# Patient Record
Sex: Female | Born: 1965 | Race: White | Hispanic: No | Marital: Married | Smoking: Former smoker
Health system: Southern US, Community
[De-identification: ages and names within clinical notes are randomized; demographics above are authoritative.]

## PROBLEM LIST (undated history)

## (undated) DIAGNOSIS — T8859XA Other complications of anesthesia, initial encounter: Secondary | ICD-10-CM

## (undated) DIAGNOSIS — M797 Fibromyalgia: Secondary | ICD-10-CM

## (undated) DIAGNOSIS — I10 Essential (primary) hypertension: Secondary | ICD-10-CM

## (undated) DIAGNOSIS — Z8669 Personal history of other diseases of the nervous system and sense organs: Secondary | ICD-10-CM

## (undated) DIAGNOSIS — T7840XA Allergy, unspecified, initial encounter: Secondary | ICD-10-CM

## (undated) DIAGNOSIS — D649 Anemia, unspecified: Secondary | ICD-10-CM

## (undated) DIAGNOSIS — A879 Viral meningitis, unspecified: Secondary | ICD-10-CM

## (undated) DIAGNOSIS — R519 Headache, unspecified: Secondary | ICD-10-CM

## (undated) DIAGNOSIS — M0230 Reiter's disease, unspecified site: Secondary | ICD-10-CM

## (undated) HISTORY — PX: APPENDECTOMY: SHX54

## (undated) HISTORY — DX: Essential (primary) hypertension: I10

## (undated) HISTORY — DX: Viral meningitis, unspecified: A87.9

## (undated) HISTORY — DX: Fibromyalgia: M79.7

## (undated) HISTORY — DX: Allergy, unspecified, initial encounter: T78.40XA

## (undated) HISTORY — DX: Anemia, unspecified: D64.9

## (undated) HISTORY — DX: Reiter's disease, unspecified site: M02.30

---

## 1998-01-27 ENCOUNTER — Emergency Department: Admit: 1998-01-27 | Payer: Self-pay

## 1998-11-25 ENCOUNTER — Inpatient Hospital Stay (HOSPITAL_COMMUNITY): Admission: AD | Admit: 1998-11-25 | Discharge: 1998-11-28 | Payer: Self-pay | Admitting: Obstetrics & Gynecology

## 1998-11-25 ENCOUNTER — Inpatient Hospital Stay (HOSPITAL_COMMUNITY): Admission: AD | Admit: 1998-11-25 | Discharge: 1998-11-25 | Payer: Self-pay | Admitting: Obstetrics and Gynecology

## 2001-04-08 ENCOUNTER — Other Ambulatory Visit: Admission: RE | Admit: 2001-04-08 | Discharge: 2001-04-08 | Payer: Self-pay | Admitting: Obstetrics and Gynecology

## 2001-04-09 ENCOUNTER — Encounter: Payer: Self-pay | Admitting: Obstetrics and Gynecology

## 2001-04-09 ENCOUNTER — Ambulatory Visit (HOSPITAL_COMMUNITY): Admission: RE | Admit: 2001-04-09 | Discharge: 2001-04-09 | Payer: Self-pay | Admitting: Obstetrics and Gynecology

## 2001-05-30 ENCOUNTER — Encounter: Payer: Self-pay | Admitting: Obstetrics and Gynecology

## 2001-05-30 ENCOUNTER — Ambulatory Visit (HOSPITAL_COMMUNITY): Admission: RE | Admit: 2001-05-30 | Discharge: 2001-05-30 | Payer: Self-pay | Admitting: Obstetrics and Gynecology

## 2001-11-09 ENCOUNTER — Inpatient Hospital Stay (HOSPITAL_COMMUNITY): Admission: AD | Admit: 2001-11-09 | Discharge: 2001-11-11 | Payer: Self-pay | Admitting: Obstetrics and Gynecology

## 2004-11-23 ENCOUNTER — Ambulatory Visit: Payer: Self-pay | Admitting: Family Medicine

## 2005-01-17 ENCOUNTER — Ambulatory Visit: Payer: Self-pay | Admitting: Internal Medicine

## 2005-05-15 ENCOUNTER — Emergency Department: Payer: Self-pay | Admitting: Emergency Medicine

## 2005-05-18 ENCOUNTER — Ambulatory Visit: Payer: Self-pay | Admitting: Family Medicine

## 2005-07-28 ENCOUNTER — Ambulatory Visit: Payer: Self-pay | Admitting: Family Medicine

## 2005-09-21 ENCOUNTER — Ambulatory Visit: Payer: Self-pay | Admitting: Family Medicine

## 2005-10-26 ENCOUNTER — Ambulatory Visit: Payer: Self-pay | Admitting: Family Medicine

## 2005-11-03 ENCOUNTER — Emergency Department: Payer: Self-pay | Admitting: Internal Medicine

## 2006-02-17 ENCOUNTER — Emergency Department: Payer: Self-pay | Admitting: Emergency Medicine

## 2006-10-03 ENCOUNTER — Ambulatory Visit: Payer: Self-pay | Admitting: Family Medicine

## 2007-03-19 ENCOUNTER — Ambulatory Visit: Payer: Self-pay | Admitting: Family Medicine

## 2007-03-19 DIAGNOSIS — IMO0002 Reserved for concepts with insufficient information to code with codable children: Secondary | ICD-10-CM | POA: Insufficient documentation

## 2007-03-21 ENCOUNTER — Emergency Department: Payer: Self-pay | Admitting: Emergency Medicine

## 2007-03-21 ENCOUNTER — Telehealth (INDEPENDENT_AMBULATORY_CARE_PROVIDER_SITE_OTHER): Payer: Self-pay | Admitting: *Deleted

## 2007-09-19 ENCOUNTER — Ambulatory Visit: Payer: Self-pay | Admitting: Family Medicine

## 2007-09-19 LAB — CONVERTED CEMR LAB: Rapid Strep: NEGATIVE

## 2007-11-18 ENCOUNTER — Telehealth (INDEPENDENT_AMBULATORY_CARE_PROVIDER_SITE_OTHER): Payer: Self-pay | Admitting: *Deleted

## 2007-11-19 ENCOUNTER — Ambulatory Visit: Payer: Self-pay | Admitting: Family Medicine

## 2007-11-19 DIAGNOSIS — S139XXA Sprain of joints and ligaments of unspecified parts of neck, initial encounter: Secondary | ICD-10-CM | POA: Insufficient documentation

## 2008-06-03 ENCOUNTER — Ambulatory Visit: Payer: Self-pay | Admitting: Obstetrics and Gynecology

## 2008-06-19 ENCOUNTER — Ambulatory Visit: Payer: Self-pay | Admitting: Family Medicine

## 2008-06-19 DIAGNOSIS — J069 Acute upper respiratory infection, unspecified: Secondary | ICD-10-CM | POA: Insufficient documentation

## 2009-03-15 ENCOUNTER — Ambulatory Visit: Payer: Self-pay | Admitting: Family Medicine

## 2009-03-15 LAB — CONVERTED CEMR LAB: Rapid Strep: NEGATIVE

## 2009-03-16 ENCOUNTER — Encounter: Payer: Self-pay | Admitting: Family Medicine

## 2009-03-25 ENCOUNTER — Encounter (INDEPENDENT_AMBULATORY_CARE_PROVIDER_SITE_OTHER): Payer: Self-pay | Admitting: *Deleted

## 2009-05-27 ENCOUNTER — Ambulatory Visit: Payer: Self-pay | Admitting: Family Medicine

## 2009-05-27 DIAGNOSIS — E663 Overweight: Secondary | ICD-10-CM | POA: Insufficient documentation

## 2009-05-27 DIAGNOSIS — R635 Abnormal weight gain: Secondary | ICD-10-CM | POA: Insufficient documentation

## 2009-05-29 LAB — CONVERTED CEMR LAB: TSH: 1.79 microintl units/mL (ref 0.35–5.50)

## 2009-05-31 ENCOUNTER — Encounter (INDEPENDENT_AMBULATORY_CARE_PROVIDER_SITE_OTHER): Payer: Self-pay | Admitting: *Deleted

## 2009-09-20 ENCOUNTER — Telehealth: Payer: Self-pay | Admitting: Internal Medicine

## 2009-09-20 ENCOUNTER — Telehealth: Payer: Self-pay | Admitting: Family Medicine

## 2009-09-21 ENCOUNTER — Emergency Department (HOSPITAL_COMMUNITY): Admission: EM | Admit: 2009-09-21 | Discharge: 2009-09-21 | Payer: Self-pay | Admitting: Family Medicine

## 2009-10-26 ENCOUNTER — Telehealth: Payer: Self-pay | Admitting: Family Medicine

## 2009-10-27 ENCOUNTER — Ambulatory Visit: Payer: Self-pay | Admitting: Family Medicine

## 2009-10-27 DIAGNOSIS — F639 Impulse disorder, unspecified: Secondary | ICD-10-CM | POA: Insufficient documentation

## 2009-10-27 DIAGNOSIS — S0990XA Unspecified injury of head, initial encounter: Secondary | ICD-10-CM | POA: Insufficient documentation

## 2009-10-27 DIAGNOSIS — R519 Headache, unspecified: Secondary | ICD-10-CM | POA: Insufficient documentation

## 2009-10-27 DIAGNOSIS — R51 Headache: Secondary | ICD-10-CM | POA: Insufficient documentation

## 2009-10-29 LAB — CONVERTED CEMR LAB
BUN: 19 mg/dL (ref 6–23)
CO2: 22 meq/L (ref 19–32)
Calcium: 9.7 mg/dL (ref 8.4–10.5)
Chloride: 106 meq/L (ref 96–112)
Creatinine, Ser: 0.9 mg/dL (ref 0.4–1.2)
GFR calc non Af Amer: 72.94 mL/min (ref >60)
Glucose, Bld: 87 mg/dL (ref 70–99)
Potassium: 4.9 meq/L (ref 3.5–5.1)
Sodium: 144 meq/L (ref 135–145)
TSH: 1.41 microintl units/mL (ref 0.35–5.50)

## 2009-11-02 ENCOUNTER — Encounter: Admission: RE | Admit: 2009-11-02 | Discharge: 2009-11-02 | Payer: Self-pay | Admitting: Otolaryngology

## 2010-05-07 ENCOUNTER — Ambulatory Visit: Payer: Self-pay | Admitting: Family Medicine

## 2010-05-07 DIAGNOSIS — M549 Dorsalgia, unspecified: Secondary | ICD-10-CM | POA: Insufficient documentation

## 2010-05-07 LAB — CONVERTED CEMR LAB
Bilirubin Urine: NEGATIVE
Blood in Urine, dipstick: NEGATIVE
Glucose, Urine, Semiquant: NEGATIVE
Ketones, urine, test strip: NEGATIVE
Nitrite: NEGATIVE
Protein, U semiquant: NEGATIVE
Specific Gravity, Urine: >=1.03
Urobilinogen, UA: 0.2
WBC Urine, dipstick: NEGATIVE
pH: 6

## 2010-05-09 ENCOUNTER — Ambulatory Visit: Payer: Self-pay | Admitting: Family Medicine

## 2010-12-11 ENCOUNTER — Encounter: Payer: Self-pay | Admitting: Family Medicine

## 2010-12-20 NOTE — Assessment & Plan Note (Signed)
Summary: FOLLOW UP WITH BACK PAIN   Vital Signs:  Patient profile:   46 year old female Height:      68.25 inches Weight:      229.0 pounds BMI:     34.69 Temp:     99.5 degrees F oral Pulse rate:   72 / minute Pulse rhythm:   regular BP sitting:   106 / 70  (left arm) Cuff size:   large  Vitals Entered By: Benny Lennert CMA Duncan Dull) (May 09, 2010 2:12 PM)  History of Present Illness: Chief complaint follow up saturday clinic (Back Pain)  45 year old female:  Friday could not even get ou tof bed Did this once before about sin months ago or so and went away on its own.   Improving, taking alleve and flexeril  REVIEW OF SYSTEMS  GEN: No systemic complaints, no fevers, chills, sweats, or other acute illnesses MSK: Detailed in the HPI GI: tolerating PO intake without difficulty Neuro: No numbness, parasthesias, or tingling associated. Otherwise the pertinent positives of the ROS are noted above.    GEN: Well-developed,well-nourished,in no acute distress; alert,appropriate and cooperative throughout examination HEENT: Normocephalic and atraumatic without obvious abnormalities. No apparent alopecia or balding. Ears, externally no deformities PULM: Breathing comfortably in no respiratory distress EXT: No clubbing, cyanosis, or edema PSYCH: Normally interactive. Cooperative during the interview. Pleasant. Friendly and conversant. Not anxious or depressed appearing. Normal, full affect.   FUll amd excellent ROM at hips L leg with back pain with SLR but no radiculopathy DTR2+ sensation intact  Allergies: 1)  ! * Generic Wellbutrin   Impression & Recommendations:  Problem # 1:  BACK PAIN (ICD-724.5) Assessment Improved getting better not true SLR +  with acute onset, suspect mild discogenic  Her updated medication list for this problem includes:    Vicodin Es 7.5-750 Mg Tabs (Hydrocodone-acetaminophen) .Marland Kitchen... Take 1 tablet by mouth three times a day    Flexeril 10 Mg  Tabs (Cyclobenzaprine hcl) .Marland Kitchen... Take 1 tablet by mouth three times a day  Complete Medication List: 1)  Vicodin Es 7.5-750 Mg Tabs (Hydrocodone-acetaminophen) .... Take 1 tablet by mouth three times a day 2)  Flexeril 10 Mg Tabs (Cyclobenzaprine hcl) .... Take 1 tablet by mouth three times a day  Current Allergies (reviewed today): ! Luvenia Heller [facility]

## 2010-12-20 NOTE — Assessment & Plan Note (Signed)
Summary: BACK PAIN/CLE   Vital Signs:  Patient profile:   45 year old female Weight:      229 pounds Temp:     98.1 degrees F oral Pulse rate:   70 / minute BP sitting:   120 / 80  (left arm)  Vitals Entered By: Doristine Devoid (May 07, 2010 9:26 AM) CC: back pain x3 days    CC:  back pain x3 days .  History of Present Illness: 45 y/o fem w her 2nd episode of severe LBP.  pain lumbar r &l costant x 3 days..sharp.Marland Kitchen a 10 . w/ rad.  No bowel or bladder problems  Allergies: 1)  ! * Generic Wellbutrin  Past History:  Past medical, surgical, family and social histories (including risk factors) reviewed for relevance to current acute and chronic problems.  Past Medical History: Reviewed history from 10/27/2009 and no changes required. concussion 2008  Family History: Reviewed history and no changes required.  Social History: Reviewed history from 10/27/2009 and no changes required. works at [employer]  light smoker married with children rare alcohol  Physical Exam  General:  Well-developed,well-nourished,in no acute distress; alert,appropriate and cooperative throughout examination Msk:  No deformity or scoliosis noted of thoracic or lumbar spine.   Pulses:  R and L carotid,radial,femoral,dorsalis pedis and posterior tibial pulses are full and equal bilaterally Extremities:  No clubbing, cyanosis, edema, or deformity noted with normal full range of motion of all joints.   Neurologic:  No cranial nerve deficits noted. Station and gait are normal. Plantar reflexes are down-going bilaterally. DTRs are symmetrical throughout. Sensory, motor and coordinative functions appear intact....pos. SLR L leg @ 30 deg.   Impression & Recommendations:  Problem # 1:  BACK PAIN (ICD-724.5) Assessment Deteriorated  Orders: UA Dipstick w/o Micro (manual) (16109)  Her updated medication list for this problem includes:    Vicodin Es 7.5-750 Mg Tabs  (Hydrocodone-acetaminophen) .Marland Kitchen... Take 1 tablet by mouth three times a day    Flexeril 10 Mg Tabs (Cyclobenzaprine hcl) .Marland Kitchen... Take 1 tablet by mouth three times a day  Complete Medication List: 1)  Juice Plus Fibre Liqd (Nutritional supplements) .... Weekly 2)  Zoloft 50 Mg Tabs (Sertraline hcl) .Marland Kitchen.. 1 by mouth once daily in evening 3)  Vicodin Es 7.5-750 Mg Tabs (Hydrocodone-acetaminophen) .... Take 1 tablet by mouth three times a day 4)  Flexeril 10 Mg Tabs (Cyclobenzaprine hcl) .... Take 1 tablet by mouth three times a day  Patient Instructions: 1)  bed rest today and sun 2)  flex. and vicodi 1/2 to 1 of each 3 x day for pain. 3)  see ypur PCP mon for followup Prescriptions: FLEXERIL 10 MG TABS (CYCLOBENZAPRINE HCL) Take 1 tablet by mouth three times a day  #30 x 1   Entered and Authorized by:   Roderick Pee MD   Signed by:   Roderick Pee MD on 05/07/2010   Method used:   Print then Give to Patient   RxID:   [id number] VICODIN ES 7.5-750 MG TABS (HYDROCODONE-ACETAMINOPHEN) Take 1 tablet by mouth three times a day  #30 x 1   Entered and Authorized by:   Roderick Pee MD   Signed by:   Roderick Pee MD on 05/07/2010   Method used:   Print then Give to Patient   RxID:   [id number]   Laboratory Results   Urine Tests    Routine Urinalysis   Glucose: negative   (  Normal Range: Negative) Bilirubin: negative   (Normal Range: Negative) Ketone: negative   (Normal Range: Negative) Spec. Gravity: >=1.030   (Normal Range: 1.003-1.035) Blood: negative   (Normal Range: Negative) pH: 6.0   (Normal Range: 5.0-8.0) Protein: negative   (Normal Range: Negative) Urobilinogen: 0.2   (Normal Range: 0-1) Nitrite: negative   (Normal Range: Negative) Leukocyte Esterace: negative   (Normal Range: Negative)

## 2011-02-15 ENCOUNTER — Encounter: Payer: Self-pay | Admitting: Family Medicine

## 2011-02-16 ENCOUNTER — Ambulatory Visit (INDEPENDENT_AMBULATORY_CARE_PROVIDER_SITE_OTHER): Payer: BC Managed Care – PPO | Admitting: Family Medicine

## 2011-02-16 ENCOUNTER — Encounter: Payer: Self-pay | Admitting: Family Medicine

## 2011-02-16 VITALS — BP 122/72 | HR 84 | Temp 97.8°F | Ht 68.25 in | Wt 241.1 lb

## 2011-02-16 DIAGNOSIS — R0789 Other chest pain: Secondary | ICD-10-CM | POA: Insufficient documentation

## 2011-02-16 NOTE — Progress Notes (Signed)
Sunday night into Monday she was having chest pressure.  She went to UC and EKG/CXR were done, normal per patient.  H pylori breath test was positive per patient.  She has been working out with Education administrator. She is slightly, episodically SOB.  She had a recent HA- h/o migraines- better with excedrin.    She doesn't get SOB/CP with exercise.  The chest pressure was constant, could happen at rest. Still able to exercise since her sx started.  No FCNAVD.  Smoking 3-5 cigs/day. She is working on quitting.  No wheeze. Prev with neg stress test per patient, but I don't have records to review.    She feels some better today.  She started the nexium yesterday.    Meds, vitals, and allergies reviewed.   ROS: See HPI.  Otherwise, noncontributory.  GEN: nad, alert and oriented HEENT: mucous membranes moist NECK: supple w/o LA CV: rrr.  no murmur PULM: ctab, no inc wob ABD: soft, +bs, very minimally ttp in epigastrum/superior region of RUQ/LUQ, not ttp o/w.  EXT: no edema SKIN: no acute rash

## 2011-02-16 NOTE — Assessment & Plan Note (Addendum)
Likely GERD.  Requesting records.  She had normal EKG and CXR per report.  This is unlikely to be cardiac.  Improved on PPI.  I would hold on treating for H pylori now- if she has return of sx after a few weeks of PPI, then we can treat for H pylori.  She'll call back after 2 week trial with PPI if sx return.  She agrees with plan.  No red flag sx- blood in stool, etc.  Okay for outpatient fu.

## 2011-02-16 NOTE — Patient Instructions (Addendum)
Keep taking the nexium for the next 2-3 weeks and work on stopping smoking.  If your symptoms come back after stopping the nexium, let us know.  We'll request your records in the meantime.  Take care.  GI caution given to patient- avoid etoh, NSAIDS, caffeine.  She understood.

## 2011-02-16 NOTE — Progress Notes (Signed)
Addended by: Raechel Ache on: 02/16/2011 09:18 AM   Modules accepted: Orders

## 2011-03-13 ENCOUNTER — Encounter: Payer: Self-pay | Admitting: Family Medicine

## 2011-03-17 ENCOUNTER — Encounter: Payer: Self-pay | Admitting: Family Medicine

## 2011-04-07 NOTE — H&P (Signed)
[facility]  Patient:    Julie, Mcknight Visit Number: [id number] MRN: [id number]          Service Type: OBS Location: [id number] Attending Physician:  Shaune Spittle Dictated by:   Marcelle Smiling Clelia Croft, C.N.M. Admit Date:  11/09/2001                           History and Physical  DATE OF BIRTH:                08-May-1966  HISTORY OF PRESENT ILLNESS:   [name] is a 45 year old married white female gravida 4 para 2-0-1-2 at 68 and four-sevenths weeks who presents for induction of labor secondary to post dates.  Her estimated date of confinement was established by certain last menstrual period and confirmed by first trimester ultrasound.  Her pregnancy has been followed by the [facility] OB/GYN certified nurse midwife service and has been remarkable for: 1. History of migraines. 2. 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 3. Increased prepregancy weight. 4. First trimester bleeding. 5. Group B strep negative.  PRENATAL LABORATORY DATA:     Collected on Apr 08, 2001:  Hemoglobin 12.6; hematocrit 38.7; platelets 242,000.  Blood type O positive, antibody negative. RPR nonreactive.  Rubella immune.  Hepatitis B surface antigen negative.  Pap smear within normal limits.  Gonorrhea and chlamydia negative from Mar 24, 2001.  On July 29, 2001 her one-hour Glucola was 93.  Hemoglobin at that time was 11.6.  Culture of the vaginal tract for group B strep on September 30, 2001 was negative.  HISTORY OF PRESENT PREGNANCY:                    She presented for care at approximately [redacted] weeks gestation.  Pregnancy ultrasonography was done soon after her first visit because of bright red vaginal bleeding as well as no fetal heart tones audible, and dating was consistent with last menstrual period dating as well as subchorionic hemorrhage was noted.  Pregnancy ultrasonography at [redacted] weeks gestation showed consistent  growth and was normal otherwise.  The rest of the pregnancy was unremarkable.  OBSTETRICAL HISTORY:          She is a gravida 4 para 2-0-1-2.  In October 1988 at [redacted] weeks gestation, she had an elective AB with no complications.  In January 1998 she vaginally delivered a female infant at [redacted] weeks gestation after 30 hours in labor.  He weighed 7 pounds 0 ounces and she received Stadol for analgesia.  In November 2000 she vaginally delivered a female infant at [redacted] weeks gestation after nine hours in labor.  His name was Aidan.  He weighed 7 pounds and 8 ounces and she had an epidural for anesthesia.  She had no complications with her pregnancies.  ALLERGIES:                    No medication allergies.  MEDICAL HISTORY:              She reports migraines from oral contraceptive use.  She reports difficulty conceiving her second pregnancy.  She has a history of HSV.  She has occasional yeast infection.  She has had the usual childhood illnesses.  She was diagnosed with gallbladder disease in 2000 which has been controlled with nutrition.  She has an occasional urinary tract infection.  She also reports  occasional ocular migraines that involve no painful headaches, only fuzzy vision.  FAMILY HISTORY:               Remarkable for father and paternal aunt with coronary artery disease.  Father with history of hypertension.  Mother with history of basal cell carcinoma.  Fathers family has a history of alcohol abuse.  GENETIC HISTORY:              Unremarkable.  SOCIAL HISTORY:               She is married to the father of the baby.  His name is [name].  They are of the [employer] religion.  They are both college educated and employed, and they deny any alcohol, tobacco, or illicit drug use with the pregnancy.  OBJECTIVE DATA:  VITAL SIGNS:                  Stable.  She is afebrile.  HEENT:                        Grossly within normal limits.  CHEST:                        Clear to  auscultation.  HEART:                        Regular to rate and rhythm.  ABDOMEN:                      Soft and nontender and gravid in contour with the uterine fundus extending approximately 40 cm above the pubic symphysis. Electronic fetal monitoring is remarkable for reactive and reassuring fetal heart rate with rare uterine contractions that are mild.  PELVIC:                       Shows no HSV lesions.  Cervix was initially 3+ cm, 80%, vertex at the 0 station.  After artificial rupture of membranes for clear fluid, the cervix was then 4 cm.  EXTREMITIES:                  Within normal limits.  ASSESSMENT:                   1. Intrauterine pregnancy at 41-and-a-half                                  weeks.                               2. Favorable cervix.  PLAN:                         1. Admit to birthing suite per consult with                                  Dr. Pennie Rushing.                               2. Routine CNM orders.  3. Proceed with induction of labor after                                  discussion with patient and father of the                                  baby of the risks and benefits involved.                               4. Plan artificial rupture of membranes as the                                  induction method. Dictated by:   Marcelle Smiling Clelia Croft, C.N.M. Attending Physician:  Shaune Spittle DD:  11/09/01 TD:  11/09/01 Job: [id number]

## 2011-05-31 ENCOUNTER — Ambulatory Visit (INDEPENDENT_AMBULATORY_CARE_PROVIDER_SITE_OTHER): Payer: BC Managed Care – PPO | Admitting: Family Medicine

## 2011-05-31 ENCOUNTER — Encounter: Payer: Self-pay | Admitting: Family Medicine

## 2011-05-31 DIAGNOSIS — M722 Plantar fascial fibromatosis: Secondary | ICD-10-CM | POA: Insufficient documentation

## 2011-05-31 NOTE — Progress Notes (Signed)
[name], a 45 y.o. female presents today in the office for the following:   Six months will have some pain at the end of the day and pain at the heel, and pain in the morning.  Icing non-stop over the weekend.   The patient presents with a 6 month long history of L heel pain. This is notable for worsening pain first thing in the morning when arising and standing after sitting.   Prior foot or ankle fractures: none Prior operations: none Orthotics or bracing: otc insole Medications: none PT or home rehab: none Night splints: no Ice massage: no Ball massage: no  Metatarsal pain: no  The PMH, PSH, Social History, Family History, Medications, and allergies have been reviewed in [facility], and have been updated if relevant.  REVIEW OF SYSTEMS  GEN: No fevers, chills. Nontoxic. Primarily MSK c/o today. MSK: Detailed in the HPI GI: tolerating PO intake without difficulty Neuro: No numbness, parasthesias, or tingling associated. Otherwise the pertinent positives of the ROS are noted above.   PHYSICAL EXAM  Blood pressure 128/76, pulse 76, temperature 98.7 F (37.1 C), temperature source Oral, weight 239 lb (108.41 kg).  GEN: Well-developed,well-nourished,in no acute distress; alert,appropriate and cooperative throughout examination HEENT: Normocephalic and atraumatic without obvious abnormalities. Ears, externally no deformities PULM: Breathing comfortably in no respiratory distress EXT: No clubbing, cyanosis, or edema PSYCH: Normally interactive. Cooperative during the interview. Pleasant. Friendly and conversant. Not anxious or depressed appearing. Normal, full affect.  Echymosis: no Edema: no ROM: full LE B Gait: heel toe, non-antalgic MT pain: no Callus pattern: none Lateral Mall: NT Medial Mall: NT Talus: NT Navicular: NT Calcaneous: NT Metatarsals: NT 5th MT: NT Phalanges: NT Achilles: NT Plantar Fascia: tender, medial along PF. Pain with forced dorsi. TTP on  bottom of heel Fat Pad: NT Peroneals: NT Post Tib: NT Great Toe: Nml motion Ant Drawer: neg Other foot breakdown: none Long arch: preserved Transverse arch: preserved Hindfoot breakdown: none Sensation: intact  A/P: Plantar fascitis: Classic history  >25 minutes spent in face to face time with patient, >50% spent in counselling or coordination of care  We reviewed that stretching is critically important to the treatment of PF. Reviewed footwear. Rigid soles have been shown to help with PF. Reviewed rehab of stretching and calf raises.   The patient would be an ideal candidate for custom orthotics, which were shown in a 2008 Cochrane Review to be beneficial in PF. Could benefit from a corticosteroid injection if conservative treatment fails.  Arch binder

## 2011-05-31 NOTE — Patient Instructions (Addendum)

## 2011-10-11 ENCOUNTER — Encounter: Payer: Self-pay | Admitting: Family Medicine

## 2011-10-11 ENCOUNTER — Ambulatory Visit (INDEPENDENT_AMBULATORY_CARE_PROVIDER_SITE_OTHER): Payer: BC Managed Care – PPO | Admitting: Family Medicine

## 2011-10-11 DIAGNOSIS — J209 Acute bronchitis, unspecified: Secondary | ICD-10-CM

## 2011-10-11 MED ORDER — AZITHROMYCIN 250 MG PO TABS: ORAL_TABLET | ORAL | Status: AC

## 2011-10-11 NOTE — Progress Notes (Signed)
  Subjective:    Patient ID: [name], female    DOB: 04-08-1966, 45 y.o.   MRN: [id number]  HPI Pt of Dr Royden Purl here as acute appt for URI symptoms. She has had a cold for a week and a hlf and now has moved into the chest and is coughing up green discharge. She denies fever or chills, some headache in the frontal area, no ear pain, some rhinitis that is clear, no ST, cough that is coming up green, no N/V, no diarrhea, some SOB on treadmilll this AM, no achiness She has taken Mucinex cough this AM.     Review of SystemsNoncontributory except as above.       Objective:   Physical Exam  Constitutional: She appears well-developed and well-nourished. No distress.  HENT:  Head: Normocephalic and atraumatic.  Right Ear: External ear normal.  Left Ear: External ear normal.  Nose: Nose normal.  Mouth/Throat: Oropharynx is clear and moist. No oropharyngeal exudate.       Mild tenderness to palpation in sinus distribution.  Eyes: Conjunctivae and EOM are normal. Pupils are equal, round, and reactive to light.  Neck: Normal range of motion. Neck supple. No thyromegaly present.  Cardiovascular: Normal rate, regular rhythm and normal heart sounds.   Pulmonary/Chest: Effort normal and breath sounds normal. She has no wheezes. She has no rales.  Lymphadenopathy:    She has no cervical adenopathy.  Skin: She is not diaphoretic.          Assessment & Plan:

## 2011-10-11 NOTE — Patient Instructions (Signed)
Take Guaifenesin (400mg ), take 11/2 tabs by mouth AM and NOON. Get GUAIFENESIN by  going to [facility] or [facility] and getting MUCOUS RELIEF EXPECTORANT/CONGESTION. DO NOT GET MUCINEX (Timed Release Guaifenesin)  Drink ;lots of fluids. Keep lozenge in mouth. Vicks at night. If no improvement, use Zpak.

## 2011-10-11 NOTE — Assessment & Plan Note (Signed)
See instructions. Discussed pathophysiology of smoking and bronchitis.

## 2011-10-25 ENCOUNTER — Ambulatory Visit: Payer: Self-pay | Admitting: Obstetrics and Gynecology

## 2011-11-28 ENCOUNTER — Encounter: Payer: Self-pay | Admitting: Family Medicine

## 2011-11-28 ENCOUNTER — Ambulatory Visit (INDEPENDENT_AMBULATORY_CARE_PROVIDER_SITE_OTHER): Payer: BC Managed Care – PPO | Admitting: Family Medicine

## 2011-11-28 VITALS — BP 116/74 | HR 76 | Temp 98.1°F | Wt 249.2 lb

## 2011-11-28 DIAGNOSIS — M545 Low back pain, unspecified: Secondary | ICD-10-CM

## 2011-11-28 MED ORDER — NAPROXEN 500 MG PO TABS: ORAL_TABLET | ORAL | Status: DC

## 2011-11-28 MED ORDER — CYCLOBENZAPRINE HCL 10 MG PO TABS: 10.0000 mg | ORAL_TABLET | Freq: Two times a day (BID) | ORAL | Status: AC | PRN

## 2011-11-28 NOTE — Patient Instructions (Signed)
I do think you have lumbar sprain, could be some irritation of lumbar discs. Treat conservatively with naprosyn and flexeril (muscle relaxant). If not improving over next few weeks let us know. If worsening please return. Continue stretching exercises using pain as your guide.

## 2011-11-28 NOTE — Progress Notes (Signed)
  Subjective:    Patient ID: [name], female    DOB: 11/29/1965, 46 y.o.   MRN: [id number]  HPI CC: back pain  5d h/o back feeling "like going to give out".  Tried NSAID, heating pad, etc.  Started new job Monday.  Had busy day, that night bad back pain with trouble sleeping from this.  Has been told may have DDD lumbar/sacral area.  Pain bilateral lower back, feels along "sacral crest".  Described as sharp stabbing, worse with position changes, rolling over in bed.  Pain associated with nausea when severe.  Also has been doing yoga stretches.    Last back pain was several years back.  Recently bending, twisting, cleaning out closet, thinks this aggravated back.  No radiculopathy, paresthesias, fevers/chills, bowel/bladder accidents.  Review of Systems Per HPI    Objective:   Physical Exam  Nursing note and vitals reviewed. Constitutional: She appears well-developed and well-nourished. No distress.       Stiff movements  Musculoskeletal:       Mild midline lumbar spine pain No paraspinous mm tenderness. Neg SLR bilaterally No pain with int/ext rotation at hips. Neg FABER test bilaterally. + mild tenderness SIJ bilaterally. No pain at GTB or sciatic notch  Neurological: She has normal strength. No sensory deficit. Coordination normal.       Diminished DTRs bilaterally  Skin: Skin is warm and dry. No rash noted.  Psychiatric: She has a normal mood and affect.       Assessment & Plan:

## 2011-11-28 NOTE — Assessment & Plan Note (Signed)
No radiculopathy or red flags. Anticipate lumbar sprain, treat conservatively with NSAIDs, flexeril. Update [place] if not improved as expected or prolonged pain. Declines stronger pain med.

## 2012-03-04 ENCOUNTER — Telehealth: Payer: Self-pay | Admitting: Family Medicine

## 2012-03-04 NOTE — Telephone Encounter (Signed)
Triage Record Num: [id number] Operator: Baldomero Lamy [name] Call Date & Time: 03/02/2012 8:01:26AM Patient Phone: [phone] PCP: Audrie Gallus. Tower Patient Gender: Female PCP Fax : Patient DOB: 05/16/1966 Practice Name:  [facility] Reason for Call: Caller: [place]/Patient; PCP: Roxy Manns A.; CB#: [phone]; Call regarding Concerns for breathing sounding differently when laying down. Calling regarding Sinus/Allergy sxs; drainage, sore throat, puffy eyes. Afebrile. Onset "2 days ago". Trx with Dayquill Sinus and warm liquids with some relief. Triaged per Sore Throat or Hoarseness. Disp: See in 2 weeks. Home care advice given with call back parameters. Protocol(s) Used: Sore Throat or Hoarseness Recommended Outcome per Protocol: See Provider within 2 Weeks Reason for Outcome: Recent or recurrent episodes of sneezing, nasal congestion; watery nasal drainage; scratchy/itchy throat; red/itchy/watery eyes or cough unrelieved after one week of home care measures Care Advice: For perennial symptoms, consider damp mopping or vacuuming house 2-3 times weekly, limit contact with pets, remove feather pillows, use nonallergenic cosmetics, and eliminate areas where mold spores can collect (e.g., piles of old newspapers and magazines, or old furniture). ~ Benadryl and Chlorpheniramine are the antihistamines used most commonly during pregnancy. These may be used according to package directions if ok'd with provider. ~ Throat symptoms that do not resolve must be evaluated by provider to rule out serious causes such as cancer. Tobacco use in any form increases risk of cancer. ~ For seasonal symptoms, avoid wooded or grassy areas during pollination season and remain indoors when symptoms are severe or pollen counts are high. ~ ~ Consider use of a saline nasal spray per package directions to help relieve nasal congestion. Avoid exposure to environmental irritants. Do  not smoke and avoid second-hand smoke. Avoid outside activities on high pollution days. Instead of strong smelling commercial cleaning products, substitute vinegar and lemon juice. ~ ~ HEALTH PROMOTION / MAINTENANCE ~ Call provider if symptoms become severe, or if treatment that relieved the symptoms in the past is no longer working. ~ SYMPTOM / CONDITION MANAGEMENT ~ CAUTIONS If not contraindicated, consider nonprescription non-sedating antihistamine for symptom relief as directed on label or by pharmacist. Other antihistamine medications may cause drowsiness or confusion, especially in elderly or chronically ill patients and should be taken with caution. ~ Do not take nonprescription decongestants (such as Actifed, Sinutab, Sudafed) if there is a history of hypertension, unless specifically directed by provider. ~ 03/02/2012 8:19:15AM Page 1 of 1 [id number]

## 2012-10-10 ENCOUNTER — Ambulatory Visit (INDEPENDENT_AMBULATORY_CARE_PROVIDER_SITE_OTHER): Payer: BC Managed Care – PPO | Admitting: Internal Medicine

## 2012-10-10 ENCOUNTER — Encounter: Payer: Self-pay | Admitting: Internal Medicine

## 2012-10-10 VITALS — BP 120/70 | HR 97 | Temp 97.7°F | Wt 241.0 lb

## 2012-10-10 DIAGNOSIS — J029 Acute pharyngitis, unspecified: Secondary | ICD-10-CM | POA: Insufficient documentation

## 2012-10-10 LAB — POCT RAPID STREP A (OFFICE): Rapid Strep A Screen: NEGATIVE

## 2012-10-10 NOTE — Progress Notes (Signed)
  Subjective:    Patient ID: [name], female    DOB: 17-Apr-1966, 46 y.o.   MRN: [id number]  HPI Sore throat for a week Now really bad---worsened Some headache in AM Constant pain today in throat --- worse with swallowed No obvious swollen glands  No fever No sig cough No SOB No ear pain No sig head congestion or rhinorrhea Slight wheezing  29 year old son has sore throat also Hasn't tried any meds for this  No current outpatient prescriptions on file prior to visit.    No Known Allergies  Past Medical History  Diagnosis Date  . Concussion 2008    No past surgical history on file.  No family history on file.  History   Social History  . Marital Status: Married    Spouse Name: N/A    Number of Children: N/A  . Years of Education: N/A   Occupational History  . [employer] in [employer]    Social History Main Topics  . Smoking status: Current Every Day Smoker  . Smokeless tobacco: Never Used  . Alcohol Use: Yes     Comment: Rare  . Drug Use: No  . Sexually Active: Not on file   Other Topics Concern  . Not on file   Social History Narrative  . No narrative on file   Review of Systems Got some nausea briefly Generally still eating No rash    Objective:   Physical Exam  Constitutional: She appears well-developed and well-nourished. No distress.  HENT:       No sinus tenderness Mild nasal inflammation Mild pharyngeal injection without exudates  Neck: Normal range of motion.       Slight tenderness along anterior cervical chain bilat without clear cut adenopathy  Pulmonary/Chest: Effort normal and breath sounds normal. No respiratory distress. She has no wheezes. She has no rales.          Assessment & Plan:

## 2012-10-10 NOTE — Assessment & Plan Note (Signed)
Has a sick 45 year old at home so I did rapid test---negative Seems to still just be viral infection Discussed supportive Rx

## 2013-02-15 ENCOUNTER — Encounter: Payer: Self-pay | Admitting: Internal Medicine

## 2013-02-15 ENCOUNTER — Emergency Department (HOSPITAL_COMMUNITY): Payer: BC Managed Care – PPO

## 2013-02-15 ENCOUNTER — Encounter (HOSPITAL_COMMUNITY): Payer: Self-pay | Admitting: Emergency Medicine

## 2013-02-15 ENCOUNTER — Ambulatory Visit (INDEPENDENT_AMBULATORY_CARE_PROVIDER_SITE_OTHER): Payer: BC Managed Care – PPO | Admitting: Internal Medicine

## 2013-02-15 ENCOUNTER — Inpatient Hospital Stay (HOSPITAL_COMMUNITY)
Admission: EM | Admit: 2013-02-15 | Discharge: 2013-02-17 | DRG: 021 | Disposition: A | Discharge status: Home / Self Care | Payer: BC Managed Care – PPO | Attending: Internal Medicine | Admitting: Internal Medicine

## 2013-02-15 VITALS — BP 122/80 | HR 74 | Temp 99.1°F | Ht 69.0 in | Wt 243.5 lb

## 2013-02-15 DIAGNOSIS — T368X5A Adverse effect of other systemic antibiotics, initial encounter: Secondary | ICD-10-CM | POA: Diagnosis not present

## 2013-02-15 DIAGNOSIS — Z79899 Other long term (current) drug therapy: Secondary | ICD-10-CM

## 2013-02-15 DIAGNOSIS — G039 Meningitis, unspecified: Secondary | ICD-10-CM

## 2013-02-15 DIAGNOSIS — F17200 Nicotine dependence, unspecified, uncomplicated: Secondary | ICD-10-CM | POA: Diagnosis present

## 2013-02-15 DIAGNOSIS — E663 Overweight: Secondary | ICD-10-CM

## 2013-02-15 DIAGNOSIS — M545 Low back pain, unspecified: Secondary | ICD-10-CM

## 2013-02-15 DIAGNOSIS — R51 Headache: Secondary | ICD-10-CM

## 2013-02-15 DIAGNOSIS — A879 Viral meningitis, unspecified: Principal | ICD-10-CM | POA: Diagnosis present

## 2013-02-15 DIAGNOSIS — R519 Headache, unspecified: Secondary | ICD-10-CM | POA: Diagnosis present

## 2013-02-15 DIAGNOSIS — L270 Generalized skin eruption due to drugs and medicaments taken internally: Secondary | ICD-10-CM | POA: Diagnosis not present

## 2013-02-15 LAB — CBC WITH DIFFERENTIAL/PLATELET
Basophils Absolute: 0.1 10*3/uL (ref 0.0–0.1)
Basophils Absolute: 0 10*3/uL (ref 0.0–0.1)
Basophils Relative: 1 % (ref 0–1)
Basophils Relative: 0 % (ref 0–1)
Eosinophils Absolute: 0.2 10*3/uL (ref 0.0–0.7)
Eosinophils Absolute: 0.2 10*3/uL (ref 0.0–0.7)
Eosinophils Relative: 2 % (ref 0–5)
HCT: 42.8 % (ref 36.0–46.0)
MCH: 27.9 pg (ref 26.0–34.0)
MCHC: 33.2 g/dL (ref 30.0–36.0)
MCHC: 32.9 g/dL (ref 30.0–36.0)
MCV: 84.8 fL (ref 78.0–100.0)
Monocytes Absolute: 0.6 10*3/uL (ref 0.1–1.0)
Monocytes Absolute: 0.8 10*3/uL (ref 0.1–1.0)
Neutro Abs: 6.5 10*3/uL (ref 1.7–7.7)
Neutrophils Relative %: 61 % (ref 43–77)
Platelets: 270 10*3/uL (ref 150–400)
RDW: 12.9 % (ref 11.5–15.5)
RDW: 12.9 % (ref 11.5–15.5)

## 2013-02-15 LAB — PROTIME-INR
INR: 1.02 (ref 0.00–1.49)
Prothrombin Time: 13.3 seconds (ref 11.6–15.2)

## 2013-02-15 LAB — COMPREHENSIVE METABOLIC PANEL
BUN: 12 mg/dL (ref 6–23)
CO2: 25 mEq/L (ref 19–32)
Chloride: 103 mEq/L (ref 96–112)
Creatinine, Ser: 0.77 mg/dL (ref 0.50–1.10)
GFR calc non Af Amer: >90 mL/min (ref >90)
Glucose, Bld: 94 mg/dL (ref 70–99)
Total Bilirubin: 0.2 mg/dL — ABNORMAL LOW (ref 0.3–1.2)

## 2013-02-15 LAB — PROTEIN AND GLUCOSE, CSF: Total  Protein, CSF: 114 mg/dL — ABNORMAL HIGH (ref 15–45)

## 2013-02-15 LAB — BASIC METABOLIC PANEL
Calcium: 9.6 mg/dL (ref 8.4–10.5)
Creatinine, Ser: 0.86 mg/dL (ref 0.50–1.10)
GFR calc Af Amer: >90 mL/min (ref >90)
GFR calc non Af Amer: 80 mL/min — ABNORMAL LOW (ref >90)
Sodium: 138 mEq/L (ref 135–145)

## 2013-02-15 LAB — URINALYSIS, ROUTINE W REFLEX MICROSCOPIC
Glucose, UA: NEGATIVE mg/dL
Ketones, ur: NEGATIVE mg/dL
Nitrite: NEGATIVE
Protein, ur: NEGATIVE mg/dL
Urobilinogen, UA: 0.2 mg/dL (ref 0.0–1.0)

## 2013-02-15 LAB — CSF CELL COUNT WITH DIFFERENTIAL
Lymphs, CSF: 94 % — ABNORMAL HIGH (ref 40–80)
Monocyte-Macrophage-Spinal Fluid: 3 % — ABNORMAL LOW (ref 15–45)
Monocyte-Macrophage-Spinal Fluid: 5 % — ABNORMAL LOW (ref 15–45)
Tube #: 4
WBC, CSF: 287 /mm3 (ref 0–5)

## 2013-02-15 LAB — MAGNESIUM: Magnesium: 2.1 mg/dL (ref 1.5–2.5)

## 2013-02-15 LAB — PHOSPHORUS: Phosphorus: 3 mg/dL (ref 2.3–4.6)

## 2013-02-15 LAB — URINE MICROSCOPIC-ADD ON

## 2013-02-15 LAB — GRAM STAIN: Special Requests: NORMAL

## 2013-02-15 MED ORDER — ONDANSETRON HCL 4 MG PO TABS: 4.0000 mg | ORAL_TABLET | Freq: Four times a day (QID) | ORAL | Status: DC | PRN

## 2013-02-15 MED ORDER — SODIUM CHLORIDE 0.9 % IV SOLN: INTRAVENOUS | Status: DC

## 2013-02-15 MED ORDER — FAMOTIDINE 10 MG PO TABS
10.0000 mg | ORAL_TABLET | Freq: Two times a day (BID) | ORAL | Status: AC
  Administered 2013-02-15 – 2013-02-16 (×2): 10 mg via ORAL
  Filled 2013-02-15 (×2): qty 1

## 2013-02-15 MED ORDER — SODIUM CHLORIDE 0.9 % IV BOLUS (SEPSIS)
500.0000 mL | Freq: Once | INTRAVENOUS | Status: AC
  Administered 2013-02-15: 500 mL via INTRAVENOUS

## 2013-02-15 MED ORDER — MORPHINE SULFATE 2 MG/ML IJ SOLN: 1.0000 mg | INTRAMUSCULAR | Status: DC | PRN

## 2013-02-15 MED ORDER — ONDANSETRON HCL 4 MG/2ML IJ SOLN: 4.0000 mg | Freq: Four times a day (QID) | INTRAMUSCULAR | Status: DC | PRN

## 2013-02-15 MED ORDER — SODIUM CHLORIDE 0.9 % IV SOLN
INTRAVENOUS | Status: DC
  Administered 2013-02-15: 75 mL via INTRAVENOUS
  Administered 2013-02-16: 20:00:00 via INTRAVENOUS

## 2013-02-15 MED ORDER — CEFTRIAXONE SODIUM 2 G IJ SOLR
2.0000 g | Freq: Two times a day (BID) | INTRAMUSCULAR | Status: DC
  Filled 2013-02-15: qty 2

## 2013-02-15 MED ORDER — VANCOMYCIN HCL 10 G IV SOLR
2000.0000 mg | INTRAVENOUS | Status: DC
  Administered 2013-02-15: 2000 mg via INTRAVENOUS
  Filled 2013-02-15: qty 2000

## 2013-02-15 MED ORDER — DIPHENHYDRAMINE HCL 25 MG PO CAPS
25.0000 mg | ORAL_CAPSULE | Freq: Three times a day (TID) | ORAL | Status: AC
  Administered 2013-02-15 – 2013-02-16 (×3): 25 mg via ORAL
  Filled 2013-02-15 (×3): qty 1

## 2013-02-15 MED ORDER — ACETAMINOPHEN 650 MG RE SUPP: 650.0000 mg | Freq: Four times a day (QID) | RECTAL | Status: DC | PRN

## 2013-02-15 MED ORDER — DEXTROSE 5 % IV SOLN
2.0000 g | INTRAVENOUS | Status: DC
  Administered 2013-02-15: 2 g via INTRAVENOUS
  Filled 2013-02-15: qty 2

## 2013-02-15 MED ORDER — VANCOMYCIN HCL IN DEXTROSE 1-5 GM/200ML-% IV SOLN
1000.0000 mg | Freq: Two times a day (BID) | INTRAVENOUS | Status: DC
  Filled 2013-02-15: qty 200

## 2013-02-15 MED ORDER — ACETAMINOPHEN 325 MG PO TABS
650.0000 mg | ORAL_TABLET | Freq: Four times a day (QID) | ORAL | Status: DC | PRN
  Administered 2013-02-16: 650 mg via ORAL
  Filled 2013-02-15: qty 2

## 2013-02-15 MED ORDER — VANCOMYCIN HCL IN DEXTROSE 1-5 GM/200ML-% IV SOLN
1000.0000 mg | Freq: Once | INTRAVENOUS | Status: DC
  Filled 2013-02-15: qty 200

## 2013-02-15 MED ORDER — OSELTAMIVIR PHOSPHATE 75 MG PO CAPS
75.0000 mg | ORAL_CAPSULE | Freq: Every day | ORAL | Status: DC
  Filled 2013-02-15 (×2): qty 1

## 2013-02-15 MED ORDER — HYDROCODONE-ACETAMINOPHEN 5-325 MG PO TABS
1.0000 | ORAL_TABLET | ORAL | Status: DC | PRN
  Administered 2013-02-16 – 2013-02-17 (×4): 1 via ORAL
  Filled 2013-02-15 (×4): qty 1

## 2013-02-15 NOTE — Progress Notes (Signed)
Subjective:     Patient ID: [name], female   DOB: 10/13/1966, 47 y.o.   MRN: [id number]  HPI [name] presents to the acute clinic with HA and neck pain.  She started to have mid-back pain (more like a sensation of pressure, not well localized, but radiating up on her spine to the head) and fever - tried to stay in bed, tried to apply Arnica, get a massage but nothing helped. Her fever was up to 101F 3 days ago. Now 59 F (did not take antipyretics today) She feels more alert today, but gets dizzy (not not like passing out, more like dysequillibrium) with walking and changing position/bending and feels that the inside of her head hurts. She continues to have back pain that radiates to her neck. The neck pain is worse with bending head forward. No cough, but noticed that if she tried to cough, her neck hurt. No sinus congestion, no ear pain. Has seasonal allergies, a little congestion lately. No recent N/V/D.  She had a concussion ~ 2009 (tripped and hit right temple >> afterwards she had behavioral changes: buying expensive things, strange behavior, now poor memory - has gotten better ) but could not have the MRI then because of the cost.   Her 3 kids 16, 14, 11 have not been sick. She is a massage therapist. No traveling recently.   Review of Systems Please see above.    Objective:   Physical Exam BP 122/80  Pulse 74  Temp(Src) 99.1 F (37.3 C) (Oral)  Ht 5\' 9"  (1.753 m)  Wt 243 lb 8 oz (110.451 kg)  BMI 35.94 kg/m2  SpO2 97% Constitutional: overweight, in NAD Eyes: PERRLA, EOMI, no exophthalmos ENT: moist mucous membranes, no thyromegaly, no cervical lymphadenopathy, no sinus point tenderness, tympanic mb. Clear, no erythematous oropharynx Positive Kernig, but negative Brudzinski sign Cardiovascular: RRR, No MRG Respiratory: CTA B Gastrointestinal: abdomen soft, NT, ND, BS+ Skin: moist, warm, no rashes MM-skeletal: no pain at palpation of post vertebrae    Assessment:      1. Headache, neck pain and fever     Plan:     - Pt with fever + subfever for the last 3-4 days, with back and neck pain, especially when bends neck forwards to touch her chin to her chest, and with dizziness with changing position. The spx are suggestive for meningitis, although she has no known sick contacts. She also has a h/o head trauma, with subsequent behavioral changes. She could not afford a brain MRI then, so unknown pathology. - I suggested that she goes to the Emergency Room to be tested for meningitis. Pt agrees to plan.

## 2013-02-15 NOTE — Progress Notes (Signed)
ANTIBIOTIC CONSULT NOTE - INITIAL  Pharmacy Consult for Vancomycin/rocephin  Indication: Meningitis   No Known Allergies  Patient Measurements: Height: 5' 8.9" (175 cm) Weight: 243 lb 9.7 oz (110.5 kg) IBW/kg (Calculated) : 65.97   Vital Signs: Temp: 100 F (37.8 C) (03/29 1655) Temp src: Oral (03/29 1655) BP: 130/91 mmHg (03/29 1655) Pulse Rate: 76 (03/29 1655) Intake/Output from previous day:   Intake/Output from this shift:    Labs:  Recent Labs  02/15/13 1348  WBC 10.1  HGB 14.2  PLT 270  CREATININE 0.86   Estimated Creatinine Clearance: 109.3 ml/min (by C-G formula based on Cr of 0.86). No results found for this basename: VANCOTROUGH, Leodis Binet, VANCORANDOM, GENTTROUGH, GENTPEAK, GENTRANDOM, TOBRATROUGH, TOBRAPEAK, TOBRARND, AMIKACINPEAK, AMIKACINTROU, AMIKACIN,  in the last 72 hours   Microbiology: Recent Results (from the past 720 hour(s))  GRAM STAIN     Status: None   Collection Time    02/15/13  4:04 PM      Result Value Range Status   Specimen Description CSF   Final   Special Requests Normal   Final   Gram Stain     Final   Value: WBC PRESENT, PREDOMINANTLY MONONUCLEAR     NO ORGANISMS SEEN     Gram Stain Report Called to,Read Back By and Verified With: C.QUEEN RN AT 1721 ON [id number] BY C.BONGEL   Report Status 02/15/2013 FINAL   Final    Medical History: Past Medical History  Diagnosis Date  . Concussion 2008    Medications:  Scheduled:   Infusions:  . sodium chloride 125 mL/hr at 02/15/13 1534  . cefTRIAXone (ROCEPHIN)  IV    . [COMPLETED] sodium chloride Stopped (02/15/13 1534)  . vancomycin     PRN:  Assessment:  47 yo F with a five-day hx  Of headache and neck pain with LP that revealed an elevated WBC and elevated Protein.  Starting Rocephin and Vancomycin for suspected meningitis   Scr WNL, CrCL > 100 ml/min  Patients weight 110.5 kg  Goal of Therapy:  Vancomycin trough level 15-20 mcg/ml  Plan:  1.) Rocephin 2 grams IV  q24h 2.) Vancomcyin 2 grams IV x1, then start 1 gram IV q12h 3.) Monitor renal function  4.) f/u cultures 5) Check vancomycin troughs as needed  Ilian Wessell, Loma Messing PharmD Pager #: [phone] 6:35 PM 02/15/2013

## 2013-02-15 NOTE — ED Notes (Signed)
Pt was seen at urgent care 3 days ago for same symptoms, was treated for flu - still has fever and body aches. States pain starts in back, radiates up to neck and front of head. C/o stiff neck/headache. Has been taking otc alieve for fever

## 2013-02-15 NOTE — ED Provider Notes (Signed)
Results for orders placed during the hospital encounter of 02/15/13  [facility] STAIN      Result Value Range   Specimen Description CSF     Special Requests Normal     Gram Stain       Value: WBC PRESENT, PREDOMINANTLY MONONUCLEAR     NO ORGANISMS SEEN     Gram Stain Report Called to,Read Back By and Verified With: C.QUEEN RN AT 1721 ON [id number] BY C.BONGEL   Report Status 02/15/2013 FINAL    CBC WITH DIFFERENTIAL      Result Value Range   WBC 10.1  4.0 - 10.5 K/uL   RBC 5.05  3.87 - 5.11 MIL/uL   Hemoglobin 14.2  12.0 - 15.0 g/dL   HCT 98.1  19.1 - 47.8 %   MCV 84.8  78.0 - 100.0 fL   MCH 28.1  26.0 - 34.0 pg   MCHC 33.2  30.0 - 36.0 g/dL   RDW 29.5  62.1 - 30.8 %   Platelets 270  150 - 400 K/uL   Neutrophils Relative 68  43 - 77 %   Neutro Abs 6.8  1.7 - 7.7 K/uL   Lymphocytes Relative 24  12 - 46 %   Lymphs Abs 2.4  0.7 - 4.0 K/uL   Monocytes Relative 6  3 - 12 %   Monocytes Absolute 0.6  0.1 - 1.0 K/uL   Eosinophils Relative 2  0 - 5 %   Eosinophils Absolute 0.2  0.0 - 0.7 K/uL   Basophils Relative 1  0 - 1 %   Basophils Absolute 0.1  0.0 - 0.1 K/uL  BASIC METABOLIC PANEL      Result Value Range   Sodium 138  135 - 145 mEq/L   Potassium 4.0  3.5 - 5.1 mEq/L   Chloride 101  96 - 112 mEq/L   CO2 28  19 - 32 mEq/L   Glucose, Bld 110 (*) 70 - 99 mg/dL   BUN 13  6 - 23 mg/dL   Creatinine, Ser 6.57  0.50 - 1.10 mg/dL   Calcium 9.6  8.4 - 84.6 mg/dL   GFR calc non Af Amer 80 (*) >90 mL/min   GFR calc Af Amer >90  >90 mL/min  URINALYSIS, ROUTINE W REFLEX MICROSCOPIC      Result Value Range   Color, Urine YELLOW  YELLOW   APPearance CLEAR  CLEAR   Specific Gravity, Urine 1.017  1.005 - 1.030   pH 6.5  5.0 - 8.0   Glucose, UA NEGATIVE  NEGATIVE mg/dL   Hgb urine dipstick NEGATIVE  NEGATIVE   Bilirubin Urine NEGATIVE  NEGATIVE   Ketones, ur NEGATIVE  NEGATIVE mg/dL   Protein, ur NEGATIVE  NEGATIVE mg/dL   Urobilinogen, UA 0.2  0.0 - 1.0 mg/dL   Nitrite NEGATIVE  NEGATIVE   Leukocytes, UA LARGE (*) NEGATIVE  URINE MICROSCOPIC-ADD ON      Result Value Range   Squamous Epithelial / LPF RARE  RARE   WBC, UA 3-6  <3 WBC/hpf   Bacteria, UA RARE  RARE  CSF CELL COUNT WITH DIFFERENTIAL      Result Value Range   Tube # 1     Color, CSF COLORLESS  COLORLESS   Appearance, CSF CLEAR  CLEAR   Supernatant NOT INDICATED     RBC Count, CSF 12 (*) 0 /cu mm   WBC, CSF 287 (*) 0 - 5 /cu mm   Segmented Neutrophils-CSF PENDING  0 -  6 %   Lymphs, CSF PENDING  40 - 80 %   Monocyte-Macrophage-Spinal Fluid PENDING  15 - 45 %   Eosinophils, CSF PENDING  0 - 1 %   Other Cells, CSF PENDING    PROTEIN AND GLUCOSE, CSF      Result Value Range   Glucose, CSF 48  43 - 76 mg/dL   Total  Protein, CSF 469 (*) 15 - 45 mg/dL  CSF CELL COUNT WITH DIFFERENTIAL      Result Value Range   Tube # 4     Color, CSF COLORLESS  COLORLESS   Appearance, CSF CLEAR  CLEAR   Supernatant NOT INDICATED     RBC Count, CSF 1 (*) 0 /cu mm   WBC, CSF 175 (*) 0 - 5 /cu mm   Segmented Neutrophils-CSF PENDING  0 - 6 %   Lymphs, CSF PENDING  40 - 80 %   Monocyte-Macrophage-Spinal Fluid PENDING  15 - 45 %   Eosinophils, CSF PENDING  0 - 1 %   Other Cells, CSF PENDING     Ct Head Wo Contrast  02/15/2013  *RADIOLOGY REPORT*  Clinical Data: Stiff neck with headaches and dizziness.  CT HEAD WITHOUT CONTRAST  Technique:  Contiguous axial images were obtained from the base of the skull through the vertex without contrast.  Comparison: None.  Findings: There is no evidence of acute intracranial hemorrhage, mass lesion, brain edema or extra-axial fluid collection.  The ventricles and subarachnoid spaces are appropriately sized for age. There is no CT evidence of acute cortical infarction.  There is mild mucosal thickening within the right anterior ethmoids.  The additional visualized paranasal sinuses, mastoid air cells and middle ears are clear. The calvarium is intact.  IMPRESSION: No acute intracranial findings.   Mild ethmoid mucosal thickening on the right.   Original Report Authenticated By: Carey Bullocks, M.D.     Patient's care was taken over from Dr. Effie Shy. Patient presents with a five-day history of worsening headache and neck pain. Her lumbar puncture does reveal an elevated white count and elevated protein. Her symptoms are likely resulting from her viral meningitis however given protein level and elevated white count I will go ahead and prophylactically cover with antibiotics. Her case was discussed with the hospitalist who will admit the patient pending cultures.  Rolan Bucco, MD 02/15/13 1820

## 2013-02-15 NOTE — ED Notes (Signed)
Pt complains of fever that started on this past Tuesday. Pt now complains of dizziness and back and neck pain. Pt was seen at her MD office and "told to come here to rule out meningitis"

## 2013-02-15 NOTE — ED Notes (Signed)
Informed consent signed by pt, placed in pt's chart

## 2013-02-15 NOTE — H&P (Addendum)
[facility] History and Physical  [name] NFA:[id number] DOB: 18-Sep-1966 DOA: 02/15/2013  Referring physician: ER physician PCP: Julie Manns, MD   Chief Complaint: headaches  HPI:  47 year old female with no significant past medical history who has presented to [facility] ED from urgent care for further evaluation of possible meningitis. Patient has had flu like symptoms for about past few days prior to this admission. Patient reported feeling generalized weakness, myalgias fevers and chills, back pain and headaches. Patient also reported neck pain and neck stiffness of same duration. She was given tamiflu for possible flu but she has not gotten any symptomatic relief. No reports of abdominal pain, no nausea or vomiting. No chest pain, no shortness of breath, no palpitations. No reports of blood in stool or urine. In ED, evaluation included CT head which was unremarkable. Patient had lumbar puncture with WBC of 175 and 94 lymphocytes. CBC and BMET are essentially all unremarkable.  Assessment and Plan:  Principal Problem:   *Headache  Possible viral and/or bacterial meningitis  Status post lumbar puncture done in ED. CSF with cell count of 175 and 94 lymphocytes  Started vanco and rocephin in ED. I think it is reasonable to continue this antibiotics regimen until fluid culture results available.  Follow up urine culture results and blood culture results. Follow up flu test by PCR.  Continue IV fluids, NS @ 75 cc /hr  Pain control with morphine 1 mg Q 4 hours PRN IV Active Problems:   Viral and possible bacterial meningitis  Management as indicated above   Code Status: Full Family Communication: Pt at bedside Disposition Plan: Admit for further evaluation  [name], MD  [facility] Pager [phone]  If 7PM-7AM, please contact night-coverage [email or web address] Password [email or web address] 02/15/2013, 6:26 PM   Review of Systems:  Constitutional: Negative for fever, chills and  malaise/fatigue. Negative for diaphoresis.  HENT: Negative for hearing loss, ear pain, nosebleeds, congestion, sore throat, neck pain, tinnitus and ear discharge   Eyes: Negative for blurred vision, double vision, photophobia, pain, discharge and redness.  Respiratory: Negative for cough, hemoptysis, sputum production, shortness of breath, wheezing and stridor.   Cardiovascular: Negative for chest pain, palpitations, orthopnea, claudication and leg swelling.  Gastrointestinal: Negative for nausea, vomiting and abdominal pain. Negative for heartburn, constipation, blood in stool and melena.  Genitourinary: Negative for dysuria, urgency, frequency, hematuria and flank pain.  Musculoskeletal: Negative for myalgias, back pain, joint pain and falls.  Skin: Negative for itching and rash.  Neurological: per hpi.  Endo/Heme/Allergies: Negative for environmental allergies and polydipsia. Does not bruise/bleed easily.  Psychiatric/Behavioral: Negative for suicidal ideas. The patient is not nervous/anxious.      Past Medical History  Diagnosis Date  . Concussion 2008   History reviewed. No pertinent past surgical history. Social History:  reports that she has been smoking Cigarettes.  She has been smoking about 0.50 packs per day. She has never used smokeless tobacco. She reports that  drinks alcohol. She reports that she does not use illicit drugs.  No Known Allergies  Family History: htn in mother   Prior to Admission medications   Medication Sig Start Date End Date Taking? Authorizing Provider  milk thistle 175 MG tablet Take 175 mg by mouth daily.   Yes Historical Provider, MD  naproxen sodium (ANAPROX) 220 MG tablet Take 220 mg by mouth as needed. Fever   Yes Historical Provider, MD  oseltamivir (TAMIFLU) 75 MG capsule Take 75 mg by mouth daily.  Yes Historical Provider, MD   Physical Exam: Filed Vitals:   02/15/13 1300 02/15/13 1523 02/15/13 1655  BP: 136/71 125/69 130/91  Pulse: 88  79 76  Temp: 97.9 F (36.6 C) 99.1 F (37.3 C) 100 F (37.8 C)  TempSrc: Oral Oral Oral  Resp:  16 18  SpO2: 100% 100% 100%    Physical Exam  Constitutional: Appears well-developed and well-nourished. No distress.  HENT: Normocephalic. External right and left ear normal. Oropharynx is clear and moist.  Eyes: Conjunctivae and EOM are normal. PERRLA, no scleral icterus.  Neck: Normal ROM. Neck supple. No JVD. No tracheal deviation. No thyromegaly.  CVS: RRR, S1/S2 +, no murmurs, no gallops, no carotid bruit.  Pulmonary: Effort and breath sounds normal, no stridor, rhonchi, wheezes, rales.  Abdominal: Soft. BS +,  no distension, tenderness, rebound or guarding.  Musculoskeletal: Normal range of motion. No edema and no tenderness.  Lymphadenopathy: No lymphadenopathy noted, cervical, inguinal. Neuro: Alert. Normal reflexes, muscle tone coordination. No cranial nerve deficit. Stiff neck appreciated but no brudzinski sign Skin: Skin is warm and dry. No rash noted. Not diaphoretic. No erythema. No pallor.  Psychiatric: Normal mood and affect. Behavior, judgment, thought content normal.   Labs on Admission:  Basic Metabolic Panel:  Recent Labs Lab 02/15/13 1348  NA 138  K 4.0  CL 101  CO2 28  GLUCOSE 110*  BUN 13  CREATININE 0.86  CALCIUM 9.6   Liver Function Tests: No results found for this basename: AST, ALT, ALKPHOS, BILITOT, PROT, ALBUMIN,  in the last 168 hours No results found for this basename: LIPASE, AMYLASE,  in the last 168 hours No results found for this basename: AMMONIA,  in the last 168 hours CBC:  Recent Labs Lab 02/15/13 1348  WBC 10.1  NEUTROABS 6.8  HGB 14.2  HCT 42.8  MCV 84.8  PLT 270   Cardiac Enzymes: No results found for this basename: CKTOTAL, CKMB, CKMBINDEX, TROPONINI,  in the last 168 hours BNP: No components found with this basename: POCBNP,  CBG: No results found for this basename: GLUCAP,  in the last 168 hours  Radiological Exams  on Admission: Ct Head Wo Contrast 02/15/2013  * IMPRESSION: No acute intracranial findings.  Mild ethmoid mucosal thickening on the right.   Original Report Authenticated By: Carey Bullocks, M.D.     EKG: Normal sinus rhythm, no ST/T wave changes    Time spent: 85 minutes

## 2013-02-15 NOTE — ED Provider Notes (Signed)
History     CSN: [id number]  Arrival date & time 02/15/13  1251   First MD Initiated Contact with Patient 02/15/13 1306      Chief Complaint  Patient presents with  . Fever    (Consider location/radiation/quality/duration/timing/severity/associated sxs/prior treatment) HPI Comments: [name] is a 47 y.o. Female who states that she has had a HA for several days with intermitant fever. No URI sx. No cough, weakness, visual changes. HA is posterior and radiates to neck. No gait problem. No GI sx. No known modifying factors. Seen at PCP office today and sent here for further evaluation.  The history is provided by the patient.    Past Medical History  Diagnosis Date  . Concussion 2008    History reviewed. No pertinent past surgical history.  History reviewed. No pertinent family history.  History  Substance Use Topics  . Smoking status: Current Every Day Smoker -- 0.50 packs/day    Types: Cigarettes  . Smokeless tobacco: Never Used  . Alcohol Use: Yes     Comment: Rare    OB History   Grav Para Term Preterm Abortions TAB SAB Ect Mult Living                  Review of Systems  All other systems reviewed and are negative.    Allergies  Vancomycin  Home Medications   Current Outpatient Rx  Name  Route  Sig  Dispense  Refill  . acetaminophen (TYLENOL) 325 MG tablet   Oral   Take 2 tablets (650 mg total) by mouth every 6 (six) hours as needed for fever.         Marland Kitchen HYDROcodone-acetaminophen (NORCO/VICODIN) 5-325 MG per tablet   Oral   Take 1 tablet by mouth every 4 (four) hours as needed (severe pain).   15 tablet   0   . naproxen sodium (ANAPROX) 220 MG tablet   Oral   Take 1 tablet (220 mg total) by mouth 2 (two) times daily as needed (headache, pain). Fever           BP 121/77  Pulse 73  Temp(Src) 98.8 F (37.1 C) (Oral)  Resp 18  Ht 5\' 9"  (1.753 m)  Wt 245 lb (111.131 kg)  BMI 36.16 kg/m2  SpO2 97%  LMP 01/17/2013  Physical Exam   Nursing note and vitals reviewed. Constitutional: She is oriented to person, place, and time. She appears well-developed.  Obese. Nontoxic  HENT:  Head: Normocephalic and atraumatic.  Eyes: Conjunctivae and EOM are normal. Pupils are equal, round, and reactive to light.  Neck: Normal range of motion and phonation normal.  Menigismus is present  Cardiovascular: Normal rate, regular rhythm and intact distal pulses.   Pulmonary/Chest: Effort normal and breath sounds normal. She exhibits no tenderness.  Abdominal: Soft. She exhibits no distension. There is no tenderness. There is no guarding.  Musculoskeletal: Normal range of motion.  Neurological: She is alert and oriented to person, place, and time. She has normal strength. She exhibits normal muscle tone.  Skin: Skin is warm and dry.  Psychiatric: She has a normal mood and affect. Her behavior is normal. Judgment and thought content normal.     Medications  0.9 %  sodium chloride infusion ( Intravenous New Bag/Given 02/16/13 2007)  acetaminophen (TYLENOL) tablet 650 mg (650 mg Oral Given 02/16/13 0321)    Or  acetaminophen (TYLENOL) suppository 650 mg ( Rectal See Alternative 02/16/13 0321)  HYDROcodone-acetaminophen (NORCO/VICODIN) 5-325 MG per  tablet 1-2 tablet (1 tablet Oral Given 02/17/13 0519)  morphine 2 MG/ML injection 1 mg (not administered)  ondansetron (ZOFRAN) tablet 4 mg (not administered)    Or  ondansetron (ZOFRAN) injection 4 mg (not administered)  sodium chloride 0.9 % bolus 500 mL (0 mLs Intravenous Stopped 02/15/13 1534)  diphenhydrAMINE (BENADRYL) capsule 25 mg (25 mg Oral Given 02/16/13 1453)  famotidine (PEPCID) tablet 10 mg (10 mg Oral Given 02/16/13 1124)    ED Course  LUMBAR PUNCTURE Date/Time: 02/15/2013 4:00 PM Performed by: Effie Shy, Donavan Kerlin L Authorized by: Flint Melter Consent: Verbal consent obtained. written consent obtained. Risks and benefits: risks, benefits and alternatives were discussed Consent  given by: patient Patient understanding: patient states understanding of the procedure being performed Patient consent: the patient's understanding of the procedure matches consent given Procedure consent: procedure consent matches procedure scheduled Relevant documents: relevant documents present and verified Test results: test results available and properly labeled Site marked: the operative site was marked Imaging studies: imaging studies not available Required items: required blood products, implants, devices, and special equipment available Patient identity confirmed: verbally with patient and hospital-assigned identification number Time out: Immediately prior to procedure a "time out" was called to verify the correct patient, procedure, equipment, support staff and site/side marked as required. Indications: evaluation for infection Anesthesia: local infiltration Local anesthetic: lidocaine 1% without epinephrine Patient sedated: no Preparation: Patient was prepped and draped in the usual sterile fashion. Lumbar space: L3-L4 interspace Patient's position: sitting Needle gauge: 22 Needle type: spinal needle - Quincke tip Needle length: 3.5 in Number of attempts: 1 Opening pressure (cm H2O): Deferred secondary to sitting position. Fluid appearance: clear Tubes of fluid: 4 Total volume: 6 ml Post-procedure: site cleaned and adhesive bandage applied Patient tolerance: Patient tolerated the procedure well with no immediate complications.   (including critical care time)  Nursing Notes Reviewed/ Care Coordinated Applicable Imaging Reviewed Interpretation of Laboratory Data incorporated into ED treatment   Labs Reviewed  BASIC METABOLIC PANEL - Abnormal; Notable for the following:    Glucose, Bld 110 (*)    GFR calc non Af Amer 80 (*)    All other components within normal limits  URINALYSIS, ROUTINE W REFLEX MICROSCOPIC - Abnormal; Notable for the following:    Leukocytes, UA  LARGE (*)    All other components within normal limits  CSF CELL COUNT WITH DIFFERENTIAL - Abnormal; Notable for the following:    RBC Count, CSF 12 (*)    WBC, CSF 287 (*)    Lymphs, CSF 96 (*)    Monocyte-Macrophage-Spinal Fluid 3 (*)    All other components within normal limits  PROTEIN AND GLUCOSE, CSF - Abnormal; Notable for the following:    Total  Protein, CSF 114 (*)    All other components within normal limits  CSF CELL COUNT WITH DIFFERENTIAL - Abnormal; Notable for the following:    RBC Count, CSF 1 (*)    WBC, CSF 175 (*)    Lymphs, CSF 94 (*)    Monocyte-Macrophage-Spinal Fluid 5 (*)    All other components within normal limits  COMPREHENSIVE METABOLIC PANEL - Abnormal; Notable for the following:    Albumin 3.2 (*)    Total Bilirubin 0.2 (*)    All other components within normal limits  CBC WITH DIFFERENTIAL - Abnormal; Notable for the following:    WBC 10.6 (*)    All other components within normal limits  COMPREHENSIVE METABOLIC PANEL - Abnormal; Notable for the following:  Glucose, Bld 101 (*)    Albumin 2.8 (*)    Total Bilirubin 0.2 (*)    GFR calc non Af Amer 78 (*)    All other components within normal limits  URINE CULTURE  CSF CULTURE  GRAM STAIN  URINE CULTURE  CBC WITH DIFFERENTIAL  URINE MICROSCOPIC-ADD ON  INFLUENZA PANEL BY PCR  MAGNESIUM  PHOSPHORUS  APTT  PROTIME-INR  TSH  CBC  PATHOLOGIST SMEAR REVIEW   Ct Head Wo Contrast  02/15/2013  *RADIOLOGY REPORT*  Clinical Data: Stiff neck with headaches and dizziness.  CT HEAD WITHOUT CONTRAST  Technique:  Contiguous axial images were obtained from the base of the skull through the vertex without contrast.  Comparison: None.  Findings: There is no evidence of acute intracranial hemorrhage, mass lesion, brain edema or extra-axial fluid collection.  The ventricles and subarachnoid spaces are appropriately sized for age. There is no CT evidence of acute cortical infarction.  There is mild mucosal  thickening within the right anterior ethmoids.  The additional visualized paranasal sinuses, mastoid air cells and middle ears are clear. The calvarium is intact.  IMPRESSION: No acute intracranial findings.  Mild ethmoid mucosal thickening on the right.   Original Report Authenticated By: Carey Bullocks, M.D.      1. Meningitis   2. Headache   3. Lower back pain   4. Overweight   5. Viral meningitis       MDM  Possible meningitis, most likely viral.  Care to Dr. Fredderick Phenix to disposition after the CSF results return        Flint Melter, MD 02/17/13 [phone]

## 2013-02-15 NOTE — Progress Notes (Signed)
Pharmacy: Brief Note  Ceftriaxone changed to 2gm IV q12h for possible meningitis.  Juliette Alcide, PharmD, BCPS.   Pager: [phone]  02/15/2013 7:45 PM

## 2013-02-16 ENCOUNTER — Encounter (HOSPITAL_COMMUNITY): Payer: Self-pay | Admitting: *Deleted

## 2013-02-16 DIAGNOSIS — A879 Viral meningitis, unspecified: Principal | ICD-10-CM

## 2013-02-16 DIAGNOSIS — R51 Headache: Secondary | ICD-10-CM

## 2013-02-16 LAB — COMPREHENSIVE METABOLIC PANEL
ALT: 12 U/L (ref 0–35)
CO2: 23 mEq/L (ref 19–32)
Calcium: 8.4 mg/dL (ref 8.4–10.5)
Creatinine, Ser: 0.88 mg/dL (ref 0.50–1.10)
GFR calc Af Amer: >90 mL/min (ref >90)
GFR calc non Af Amer: 78 mL/min — ABNORMAL LOW (ref >90)
Glucose, Bld: 101 mg/dL — ABNORMAL HIGH (ref 70–99)
Sodium: 136 mEq/L (ref 135–145)
Total Protein: 6.2 g/dL (ref 6.0–8.3)

## 2013-02-16 LAB — CBC
Hemoglobin: 12.3 g/dL (ref 12.0–15.0)
MCH: 27.8 pg (ref 26.0–34.0)
MCHC: 33.1 g/dL (ref 30.0–36.0)
MCV: 84 fL (ref 78.0–100.0)

## 2013-02-16 LAB — URINE CULTURE: Colony Count: >=100000

## 2013-02-16 NOTE — Progress Notes (Signed)
Utilization Review Completed.[name] T3/30/2014  

## 2013-02-16 NOTE — Progress Notes (Signed)
[facility] PROGRESS NOTE  [name] ZOX:[id number] DOB: [birth year] DOA: 02/15/2013 PCP: Roxy Manns, MD  Assessment/Plan: 1. Viral meningitis: -supportive care, IVF , tylenol/NSAIDs PRN -stopped IV Ceftriaxone -ID consult requested -FLU PCR negative   Code Status: FULL Family Communication: none, d/w pt at bedside Disposition Plan: home later today if ok with ID    Consultants:  ID Dr.COmer  Procedures: LP Antibiotics:  Ceftriaxone  HPI/Subjective: Had low grade fevers yesterday and sweating, then developed a rash with Iv VAnc  Objective: Filed Vitals:   02/15/13 1835 02/15/13 1932 02/15/13 2231 02/16/13 0600  BP: 121/76 114/74 113/75 110/74  Pulse: 74 78 81 63  Temp: 99.9 F (37.7 C) 99.1 F (37.3 C) 98.6 F (37 C) 98.9 F (37.2 C)  TempSrc: Oral Oral Oral Oral  Resp: 18 20 20 20   Height:  5\' 9"  (1.753 m)    Weight:  109.77 kg (242 lb)    SpO2: 100% 100% 98% 96%    Intake/Output Summary (Last 24 hours) at 02/16/13 0804 Last data filed at 02/16/13 0600  Gross per 24 hour  Intake   1200 ml  Output      0 ml  Net   1200 ml   Filed Weights   02/15/13 1700 02/15/13 1932  Weight: 110.5 kg (243 lb 9.7 oz) 109.77 kg (242 lb)    Exam:   General:  AAox3  Cardiovascular: S1S2/RRR  Respiratory: CTAB  Abdomen: soft, Nt, BS present  Ext: no edema c/c  Skin: no rash  Data Reviewed: Basic Metabolic Panel:  Recent Labs Lab 02/15/13 1348 02/15/13 2020 02/16/13 0535  NA 138 137 136  K 4.0 3.8 4.1  CL 101 103 106  CO2 28 25 23   GLUCOSE 110* 94 101*  BUN 13 12 16   CREATININE 0.86 0.77 0.88  CALCIUM 9.6 8.9 8.4  MG  --  2.1  --   PHOS  --  3.0  --    Liver Function Tests:  Recent Labs Lab 02/15/13 2020 02/16/13 0535  AST 12 9  ALT 13 12  ALKPHOS 55 49  BILITOT 0.2* 0.2*  PROT 6.7 6.2  ALBUMIN 3.2* 2.8*   No results found for this basename: LIPASE, AMYLASE,  in the last 168 hours No results found for this basename:  AMMONIA,  in the last 168 hours CBC:  Recent Labs Lab 02/15/13 1348 02/15/13 2020 02/16/13 0535  WBC 10.1 10.6* 8.3  NEUTROABS 6.8 6.5  --   HGB 14.2 12.8 12.3  HCT 42.8 38.9 37.2  MCV 84.8 84.9 84.0  PLT 270 239 226   Cardiac Enzymes: No results found for this basename: CKTOTAL, CKMB, CKMBINDEX, TROPONINI,  in the last 168 hours BNP (last 3 results) No results found for this basename: PROBNP,  in the last 8760 hours CBG: No results found for this basename: GLUCAP,  in the last 168 hours  Recent Results (from the past 240 hour(s))  CSF CULTURE     Status: None   Collection Time    02/15/13  4:04 PM      Result Value Range Status   Specimen Description CSF   Final   Special Requests Normal   Final   Gram Stain     Final   Value: WBC PRESENT, PREDOMINANTLY MONONUCLEAR     NO ORGANISMS SEEN     Gram Stain Report Called to,Read Back By and Verified With: Gram Stain Report Called to,Read Back By and Verified With: Shepard General RN  AT [id number] [id number] BY C BONGEL Performed at [facility]   Culture PENDING   Incomplete   Report Status PENDING   Incomplete  GRAM STAIN     Status: None   Collection Time    02/15/13  4:04 PM      Result Value Range Status   Specimen Description CSF   Final   Special Requests Normal   Final   Gram Stain     Final   Value: WBC PRESENT, PREDOMINANTLY MONONUCLEAR     NO ORGANISMS SEEN     Gram Stain Report Called to,Read Back By and Verified With: C.QUEEN RN AT 1721 ON [id number] BY C.BONGEL   Report Status 02/15/2013 FINAL   Final     Studies: Ct Head Wo Contrast  02/15/2013  *RADIOLOGY REPORT*  Clinical Data: Stiff neck with headaches and dizziness.  CT HEAD WITHOUT CONTRAST  Technique:  Contiguous axial images were obtained from the base of the skull through the vertex without contrast.  Comparison: None.  Findings: There is no evidence of acute intracranial hemorrhage, mass lesion, brain edema or extra-axial fluid collection.  The ventricles and  subarachnoid spaces are appropriately sized for age. There is no CT evidence of acute cortical infarction.  There is mild mucosal thickening within the right anterior ethmoids.  The additional visualized paranasal sinuses, mastoid air cells and middle ears are clear. The calvarium is intact.  IMPRESSION: No acute intracranial findings.  Mild ethmoid mucosal thickening on the right.   Original Report Authenticated By: Carey Bullocks, M.D.     Scheduled Meds: . diphenhydrAMINE  25 mg Oral Q8H  . famotidine  10 mg Oral BID   Continuous Infusions: . sodium chloride 75 mL (02/15/13 1945)    Principal Problem:   Headache Active Problems:   Viral meningitis    Time spent:    [facility]  [facility] Pager [phone]. If 7PM-7AM, please contact night-coverage at [email or web address], password [facility] 02/16/2013, 8:04 AM  LOS: 1 day

## 2013-02-16 NOTE — Consult Note (Addendum)
    [facility]     Reason for Consult: viral meningitis    Referring Physician: Dr. Jomarie Longs  Principal Problem:   Headache Active Problems:   Viral meningitis   . diphenhydrAMINE  25 mg Oral Q8H    Recommendations: Stop antibiotics as you have done Supportive care I have d/ced isolation for droplet   Thanks for the consult, call with questions.   Assessment: Viral meningitis based on CSF findings, gram stain negative, not ill appearing, no rash/petechiae.     Antibiotics: Vancomycin and Rocephin x 1 dose  HPI: [name] is a 47 y.o. female with a history of a concussion remotely who presented to ED with headache, photophobia, fever for the last 5 days.  No history of this before.  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  No recent known viral exposures.  Some nausea, no vomiting.  No history of meningitis before.  Work up in ED revealed increased CSF WBCs with lymphocyte predominance.  No recent travel.  Had a red man reaction to vancomycin.    Review of Systems: Pertinent items are noted in HPI.  Past Medical History  Diagnosis Date  . Concussion 2008    History  Substance Use Topics  . Smoking status: Current Every Day Smoker -- 0.50 packs/day    Types: Cigarettes  . Smokeless tobacco: Never Used  . Alcohol Use: Yes     Comment: Rare    History reviewed. No pertinent family history. Allergies  Allergen Reactions  . Vancomycin Rash    OBJECTIVE: Blood pressure 110/74, pulse 63, temperature 98.9 F (37.2 C), temperature source Oral, resp. rate 20, height 5\' 9"  (1.753 m), weight 242 lb (109.77 kg), last menstrual period 01/17/2013, SpO2 96.00%. General: Awake, alert, oriented x 3, appears in nad; does not appear toxic Skin: no rashes Lungs: CTA B Cor: RRR without m/r/g Abdomen: soft, nt, nd, +bs, no HSM Ext: no edema   Microbiology: Recent Results (from the past 240 hour(s))  CSF CULTURE     Status: None   Collection Time    02/15/13  4:04 PM      Result Value Range Status   Specimen Description CSF   Final   Special Requests Normal   Final   Gram Stain     Final   Value: WBC PRESENT, PREDOMINANTLY MONONUCLEAR     NO ORGANISMS SEEN     Gram Stain Report Called to,Read Back By and Verified With: Gram Stain Report Called to,Read Back By and Verified WithShepard General RN AT [phone] BY C BONGEL Performed at [facility]   Culture PENDING   Incomplete   Report Status PENDING   Incomplete  GRAM STAIN     Status: None   Collection Time    02/15/13  4:04 PM      Result Value Range Status   Specimen Description CSF   Final   Special Requests Normal   Final   Gram Stain     Final   Value: WBC PRESENT, PREDOMINANTLY MONONUCLEAR     NO ORGANISMS SEEN     Gram Stain Report Called to,Read Back By and Verified With: C.QUEEN RN AT 1721 ON [id number] BY C.BONGEL   Report Status 02/15/2013 FINAL   Final    Staci Righter, MD [facility] [phone] pager  [phone] cell 02/16/2013, 12:03 PM

## 2013-02-17 LAB — URINE CULTURE: Colony Count: NO GROWTH

## 2013-02-17 MED ORDER — NAPROXEN SODIUM 220 MG PO TABS: 220.0000 mg | ORAL_TABLET | Freq: Two times a day (BID) | ORAL | Status: DC | PRN

## 2013-02-17 MED ORDER — ACETAMINOPHEN 325 MG PO TABS: 650.0000 mg | ORAL_TABLET | Freq: Four times a day (QID) | ORAL | Status: DC | PRN

## 2013-02-17 MED ORDER — HYDROCODONE-ACETAMINOPHEN 5-325 MG PO TABS: 1.0000 | ORAL_TABLET | ORAL | Status: DC | PRN

## 2013-02-17 NOTE — Progress Notes (Signed)
Discharge instructions given to pt, verbalized understanding. Left the unit in stable condition. 

## 2013-02-19 LAB — CSF CULTURE W GRAM STAIN: Culture: NO GROWTH

## 2013-02-20 ENCOUNTER — Emergency Department (HOSPITAL_COMMUNITY)
Admission: EM | Admit: 2013-02-20 | Discharge: 2013-02-20 | Disposition: A | Discharge status: Home / Self Care | Payer: BC Managed Care – PPO | Attending: Emergency Medicine | Admitting: Emergency Medicine

## 2013-02-20 ENCOUNTER — Telehealth: Payer: Self-pay | Admitting: Family Medicine

## 2013-02-20 ENCOUNTER — Encounter (HOSPITAL_COMMUNITY): Payer: Self-pay | Admitting: Emergency Medicine

## 2013-02-20 DIAGNOSIS — Z87828 Personal history of other (healed) physical injury and trauma: Secondary | ICD-10-CM | POA: Insufficient documentation

## 2013-02-20 DIAGNOSIS — Z8679 Personal history of other diseases of the circulatory system: Secondary | ICD-10-CM | POA: Insufficient documentation

## 2013-02-20 DIAGNOSIS — G971 Other reaction to spinal and lumbar puncture: Secondary | ICD-10-CM | POA: Insufficient documentation

## 2013-02-20 DIAGNOSIS — R51 Headache: Secondary | ICD-10-CM

## 2013-02-20 DIAGNOSIS — R112 Nausea with vomiting, unspecified: Secondary | ICD-10-CM | POA: Insufficient documentation

## 2013-02-20 DIAGNOSIS — F17200 Nicotine dependence, unspecified, uncomplicated: Secondary | ICD-10-CM | POA: Insufficient documentation

## 2013-02-20 HISTORY — DX: Personal history of other diseases of the nervous system and sense organs: Z86.69

## 2013-02-20 NOTE — ED Notes (Signed)
Pt c/o headache x4 days.  Pt reports that she was seen last week and diagnosed viral meningitis.  Although pt has hx of migraine she reports that the location of the headache is different and is concerned that she may have a leak caused by the lumbar puncture procedure during her last visit.

## 2013-02-20 NOTE — Telephone Encounter (Signed)
Patient Information:  Caller Name: Rielyn  Phone: [phone]  Patient: [name], [name]  Gender: Female  DOB: Aug 29, 1966  Age: 47 Years  PCP: [place] [facility])  Pregnant: No  Office Follow Up:  Does the office need to follow up with this patient?: No  Instructions For The Office: N/A  RN Note:  Contacted the office and per Dr Dayton Martes pt sent to the ED for evaluation.  Symptoms  Reason For Call & Symptoms: Pt states she was in the hospital this weekend due to Viral meningitis. Pt discharged on 02/17/13.  Pt complains of severe headache getting worse.  Reviewed Health History In EMR: Yes  Reviewed Medications In EMR: Yes  Reviewed Allergies In EMR: Yes  Reviewed Surgeries / Procedures: Yes  Date of Onset of Symptoms: 02/18/2013 OB / GYN:  LMP: Unknown  Guideline(s) Used:  Headache  Disposition Per Guideline:   Go to ED Now (or to Office with PCP Approval)  Reason For Disposition Reached:   Severe headache, states "worst headache" of life  Advice Given:  N/A  Patient Will Follow Care Advice:  YES

## 2013-02-20 NOTE — ED Provider Notes (Signed)
Medical screening examination/treatment/procedure(s) were performed by non-physician practitioner and as supervising physician I was immediately available for consultation/collaboration.   Gavin Pound. Oletta Lamas, MD 02/20/13 [phone]

## 2013-02-20 NOTE — Telephone Encounter (Signed)
I agree with that advisement - will keep my eye out for hospital records

## 2013-02-20 NOTE — ED Provider Notes (Signed)
History     CSN: [id number]  Arrival date & time 02/20/13  1440   First MD Initiated Contact with Patient 02/20/13 1501      Chief Complaint  Patient presents with  . Headache    (Consider location/radiation/quality/duration/timing/severity/associated sxs/prior treatment) HPI Comments: Patient with recent viral meningitis, who presents to the ED with worsening headache over the past 4 days.  Patient was diagnosed with viral meningitis on 3/29.  An LP was performed.   Patient states that she has had a headache since 2 days following the procedure.  She states that it is worse with standing, and better with laying.  She has tried drinking caffeine and taking aleve, which have not helped.  She states that the pain is moderate-severe.  She endorses some associated nausea and vomiting.  She denies having any fever.    The history is provided by the patient. No language interpreter was used.    Past Medical History  Diagnosis Date  . Concussion 2008  . Hx of migraines     History reviewed. No pertinent past surgical history.  No family history on file.  History  Substance Use Topics  . Smoking status: Current Every Day Smoker -- 0.50 packs/day    Types: Cigarettes  . Smokeless tobacco: Never Used  . Alcohol Use: Yes     Comment: Rare    OB History   Grav Para Term Preterm Abortions TAB SAB Ect Mult Living                  Review of Systems  All other systems reviewed and are negative.    Allergies  Vancomycin  Home Medications   Current Outpatient Rx  Name  Route  Sig  Dispense  Refill  . HYDROcodone-acetaminophen (NORCO/VICODIN) 5-325 MG per tablet   Oral   Take 1 tablet by mouth every 4 (four) hours as needed (severe pain).   15 tablet   0   . milk thistle 175 MG tablet   Oral   Take 175 mg by mouth daily.         . naproxen sodium (ANAPROX) 220 MG tablet   Oral   Take 1 tablet (220 mg total) by mouth 2 (two) times daily as needed (headache, pain).  Fever           BP 140/73  Pulse 75  Temp(Src) 97.8 F (36.6 C) (Oral)  Resp 16  SpO2 95%  LMP 01/17/2013  Physical Exam  Nursing note and vitals reviewed. Constitutional: She is oriented to person, place, and time. She appears well-developed and well-nourished.  HENT:  Head: Normocephalic and atraumatic.  Non-tender over temporal artery, no increased pain with chewing.  Eyes: Conjunctivae and EOM are normal. Pupils are equal, round, and reactive to light.  No papilledema  Neck: Normal range of motion. Neck supple.  No pain with neck flexion, no meningismus  Cardiovascular: Normal rate, regular rhythm and normal heart sounds.  Exam reveals no gallop and no friction rub.   No murmur heard. Pulmonary/Chest: Effort normal and breath sounds normal. No respiratory distress. She has no wheezes. She has no rales. She exhibits no tenderness.  Abdominal: Soft. Bowel sounds are normal. She exhibits no distension and no mass. There is no tenderness. There is no rebound and no guarding.  Musculoskeletal: Normal range of motion. She exhibits no edema and no tenderness.  Normal gait.  Neurological: She is alert and oriented to person, place, and time. She has normal reflexes.  CN 3-12 intact, no pronator drift, normal shin to heel, normal RAM, sensation and strength intact bilaterally.  Skin: Skin is warm and dry.  Site of previous LP appears to be well-healing, and without any surrounding signs of infection.  Psychiatric: She has a normal mood and affect. Her behavior is normal. Judgment and thought content normal.    ED Course  Procedures (including critical care time)  Labs Reviewed - No data to display No results found.   1. Headache   2. Post lumbar puncture headache       MDM  LP 5 days ago.  Worsening HA after 2 days.  Worse with standing and with head movement.  Discussed with Dr. Oletta Lamas.  Patient does not want any fluids or pain meds here.  She says that she wants to go  home, and will follow-up with IR tomorrow morning for blood patch.        Roxy Horseman, PA-C 02/20/13 1626

## 2013-02-21 ENCOUNTER — Ambulatory Visit
Admit: 2013-02-21 | Discharge: 2013-02-21 | Disposition: A | Discharge status: Home / Self Care | Payer: BC Managed Care – PPO | Attending: *Deleted | Admitting: *Deleted

## 2013-02-21 VITALS — BP 111/72 | HR 61

## 2013-02-21 DIAGNOSIS — R51 Headache: Secondary | ICD-10-CM

## 2013-02-21 MED ORDER — IOHEXOL 180 MG/ML  SOLN
1.0000 mL | Freq: Once | INTRAMUSCULAR | Status: AC | PRN
  Administered 2013-02-21: 1 mL via EPIDURAL

## 2013-02-21 NOTE — Progress Notes (Signed)
Pt is in good spirits post blood patch, husband at bedside.

## 2013-02-24 ENCOUNTER — Encounter: Payer: Self-pay | Admitting: Family Medicine

## 2013-02-24 ENCOUNTER — Ambulatory Visit (INDEPENDENT_AMBULATORY_CARE_PROVIDER_SITE_OTHER): Payer: BC Managed Care – PPO | Admitting: Family Medicine

## 2013-02-24 ENCOUNTER — Ambulatory Visit: Payer: BC Managed Care – PPO | Admitting: Family Medicine

## 2013-02-24 VITALS — BP 108/64 | HR 77 | Temp 98.4°F | Ht 69.0 in | Wt 246.0 lb

## 2013-02-24 DIAGNOSIS — A879 Viral meningitis, unspecified: Secondary | ICD-10-CM

## 2013-02-24 NOTE — Assessment & Plan Note (Signed)
Complicated from LP headache afterwards - improved with a blood patch  Today- just tired and headache is almost gone Disc that it will take her quite a while to get energy level back- to gradually return to nl fxn Rev hosp records and studies in detail

## 2013-02-24 NOTE — Patient Instructions (Addendum)
I'm glad you are gradually doing better and the fatigue should improve slowly Very gradually increase your activity- increase by 15 minutes per day - and then decide when you are ready to go back to work and for how long  If you need a note from me - let me know  [place] job with the smoking- do not go back to it

## 2013-02-24 NOTE — Progress Notes (Signed)
Subjective:    Patient ID: [name], female    DOB: [birth year], 47 y.o.   MRN: [id number]  HPI Here for hosp f/u  Was hosp late march for viral meningitis - had CSF with wbc lymphocytes and neg cx Saw Dr Luciana Axe from ID She had rxn to vanco (red man rxn)- bad rash   Went home -dev more ha thought to be from her lumbar puncture Then had a blood patch  Is overall doing better - but quite tired and she is sleeping 12 hours at a time Dull headache - nothing like it was  Can get up and move for 1-2 hours and then has to lie down again   Has not taken any vicodin since blood patch , and has not needed any naproxen today  She does massage for a living  This is a very physical job  Vitals are stable today   Smoking status - 1 cigarette in the past week- proud of that  Thinks she is done   Patient Active Problem List  Diagnosis  . Headache  . Viral meningitis   Past Medical History  Diagnosis Date  . Concussion 2008  . Hx of migraines   . Viral meningitis    No past surgical history on file. History  Substance Use Topics  . Smoking status: Current Every Day Smoker -- 0.25 packs/day    Types: Cigarettes  . Smokeless tobacco: Never Used  . Alcohol Use: Yes     Comment: Rare   No family history on file. Allergies  Allergen Reactions  . Vancomycin Rash    Red man reaction   Current Outpatient Prescriptions on File Prior to Visit  Medication Sig Dispense Refill  . milk thistle 175 MG tablet Take 175 mg by mouth daily.      . naproxen sodium (ANAPROX) 220 MG tablet Take 1 tablet (220 mg total) by mouth 2 (two) times daily as needed (headache, pain). Fever      . HYDROcodone-acetaminophen (NORCO/VICODIN) 5-325 MG per tablet Take 1 tablet by mouth every 4 (four) hours as needed (severe pain).  15 tablet  0   No current facility-administered medications on file prior to visit.    Review of Systems Review of Systems  Constitutional: Negative for fever, appetite  change, and unexpected weight change.  Eyes: Negative for pain and visual disturbance.  Respiratory: Negative for cough and shortness of breath.   Cardiovascular: Negative for cp or palpitations    Gastrointestinal: Negative for nausea, diarrhea and constipation.  Genitourinary: Negative for urgency and frequency.  Skin: Negative for pallor or rash   Neurological: Negative for weakness, light-headedness, numbness and pos for mild headache/ improved   Hematological: Negative for adenopathy. Does not bruise/bleed easily.  Psychiatric/Behavioral: Negative for dysphoric mood. The patient is not nervous/anxious.         Objective:   Physical Exam  Constitutional: She appears well-developed and well-nourished. No distress.  obese and well appearing   HENT:  Head: Normocephalic and atraumatic.  Right Ear: External ear normal.  Left Ear: External ear normal.  Nose: Nose normal.  Mouth/Throat: Oropharynx is clear and moist.  Eyes: EOM are normal. Pupils are equal, round, and reactive to light. Right eye exhibits no discharge. Left eye exhibits no discharge. No scleral icterus.  Neck: Normal range of motion. Neck supple. No JVD present. Carotid bruit is not present. No thyromegaly present.  Cardiovascular: Normal rate, regular rhythm and normal heart sounds.  Exam reveals no  gallop.   Pulmonary/Chest: Effort normal and breath sounds normal. No respiratory distress. She has no wheezes.  Abdominal: Soft. Bowel sounds are normal. She exhibits no distension and no mass. There is no tenderness.  Musculoskeletal: She exhibits no edema.  Lymphadenopathy:    She has no cervical adenopathy.  Neurological: She is alert. She has normal reflexes. She displays no atrophy and no tremor. No cranial nerve deficit or sensory deficit. She exhibits normal muscle tone. Coordination and gait normal.  No cerebellar signs  Skin: Skin is warm and dry. No rash noted. No erythema. No pallor.  Psychiatric: She has a  normal mood and affect.          Assessment & Plan:

## 2013-03-05 NOTE — Discharge Summary (Signed)
Physician Discharge Summary  DREA JUREWICZ ZOX:[id number] DOB: 1966/09/09 DOA: 02/15/2013  PCP: Roxy Manns, MD  Admit date: 02/15/2013 Discharge date: 03/05/2013  Time spent: 35 minutes  Recommendations for Outpatient Follow-up:  PCP in 1 week  Discharge Diagnoses:  Principal Problem:   Headache Active Problems:   Viral meningitis   Discharge Condition: stable  Diet recommendation: regular  Filed Weights   02/15/13 1700 02/15/13 1932 02/17/13 0600  Weight: 110.5 kg (243 lb 9.7 oz) 109.77 kg (242 lb) 111.131 kg (245 lb)    History of present illness:  47 year old female with no significant past medical history who has presented to [facility] ED from urgent care for further evaluation of possible meningitis. Patient has had flu like symptoms for about past few days prior to this admission. Patient reported feeling generalized weakness, myalgias fevers and chills, back pain and headaches. Patient also reported neck pain and neck stiffness of same duration. She was given tamiflu for possible flu but she has not gotten any symptomatic relief. No reports of abdominal pain, no nausea or vomiting. No chest pain, no shortness of breath, no palpitations. No reports of blood in stool or urine.  In ED, evaluation included CT head which was unremarkable. Patient had lumbar puncture with WBC of 175 and 94 lymphocytes. CBC and BMET are essentially all unremarkable.      Hospital Course:  1. Viral meningitis: -Treated with supportive care, IVF , tylenol/NSAIDs PRN  -stopped empiric IV antibiotics  -ID consulted, seen by Dr.Comer who recommended supportive care only.  -FLU PCR negative -discharged home in improved condition   Consultations:  Dr.Comer , ID  Discharge Exam: Filed Vitals:   02/16/13 0600 02/16/13 1421 02/16/13 2200 02/17/13 0600  BP: 110/74 109/68 125/80 121/77  Pulse: 63 69 75 73  Temp: 98.9 F (37.2 C) 98.1 F (36.7 C) 99.1 F (37.3 C) 98.8 F (37.1 C)  TempSrc: Oral  Oral Oral Oral  Resp: 20 20 20 18   Height:      Weight:    111.131 kg (245 lb)  SpO2: 96% 100% 98% 97%    General: AAOx3 Cardiovascular: S1S2/RRR Respiratory: CTAB  Discharge Instructions  Discharge Orders   Future Orders Complete By Expires     Discharge instructions  As directed     Comments:      DO not drive or operate machinery while taking narcotics    Increase activity slowly  As directed         Medication List    STOP taking these medications       oseltamivir 75 MG capsule  Commonly known as:  TAMIFLU      TAKE these medications       HYDROcodone-acetaminophen 5-325 MG per tablet  Commonly known as:  NORCO/VICODIN  Take 1 tablet by mouth every 4 (four) hours as needed (severe pain).     milk thistle 175 MG tablet  Take 175 mg by mouth daily.     naproxen sodium 220 MG tablet  Commonly known as:  ANAPROX  Take 1 tablet (220 mg total) by mouth 2 (two) times daily as needed (headache, pain). Fever           Follow-up Information   Follow up with Roxy Manns, MD In 1 week.   Contact information:   [address] [place] [address] [place] [place] [address] [phone]        The results of significant diagnostics from this hospitalization (including imaging,  microbiology, ancillary and laboratory) are listed below for reference.    Significant Diagnostic Studies: Ct Head Wo Contrast  02/15/2013  *RADIOLOGY REPORT*  Clinical Data: Stiff neck with headaches and dizziness.  CT HEAD WITHOUT CONTRAST  Technique:  Contiguous axial images were obtained from the base of the skull through the vertex without contrast.  Comparison: None.  Findings: There is no evidence of acute intracranial hemorrhage, mass lesion, brain edema or extra-axial fluid collection.  The ventricles and subarachnoid spaces are appropriately sized for age. There is no CT evidence of acute cortical infarction.  There is mild mucosal thickening within the right anterior  ethmoids.  The additional visualized paranasal sinuses, mastoid air cells and middle ears are clear. The calvarium is intact.  IMPRESSION: No acute intracranial findings.  Mild ethmoid mucosal thickening on the right.   Original Report Authenticated By: Carey Bullocks, M.D.    Dg Epidurography  02/21/2013  *RADIOLOGY REPORT*  LUMBAR EPIDURAL BLOOD PATCH  Clinical Data: LUMBAR PUNCTURE 6 DAYS AGO.  POSITIONAL HEADACHES CONSISTENT WITH CSF LEAK.  Procedure: The procedure, risks, benefits, and alternatives were explained to the patient. Questions regarding the procedure were encouraged and answered. The patient understands and consents to the procedure.  LUMBAR EPIDURAL BLOOD PATCH: 18 cc of blood were withdrawn from the patient's rightantecubital fossa by myself.  The skin at the prior LP site was scrubbed with Betadine and draped in sterile fashion. Skin anesthesia was carried out using 1% Lidocaine. An epidural approach was taken on the right at L3-4 using a 20 gauge Crawford epidural needle.  Epidural positioning was confirmed by injecting a small amount of Omnipaque 180.  There was no vascular communication.  15 cc of the patient's blood was slowly injected into the epidural space in this location.  The procedure was well- tolerated and the patient was discharged in good condition with instructions to lie down for an additional day.  Flouro time: 28 seconds  IMPRESSION: Lumbar epidural blood patch on the right at L3-4.   Original Report Authenticated By: Paulina Fusi, M.D.     Microbiology: No results found for this or any previous visit (from the past 240 hour(s)).   Labs: Basic Metabolic Panel: No results found for this basename: NA, K, CL, CO2, GLUCOSE, BUN, CREATININE, CALCIUM, MG, PHOS,  in the last 168 hours Liver Function Tests: No results found for this basename: AST, ALT, ALKPHOS, BILITOT, PROT, ALBUMIN,  in the last 168 hours No results found for this basename: LIPASE, AMYLASE,  in the last  168 hours No results found for this basename: AMMONIA,  in the last 168 hours CBC: No results found for this basename: WBC, NEUTROABS, HGB, HCT, MCV, PLT,  in the last 168 hours Cardiac Enzymes: No results found for this basename: CKTOTAL, CKMB, CKMBINDEX, TROPONINI,  in the last 168 hours BNP: BNP (last 3 results) No results found for this basename: PROBNP,  in the last 8760 hours CBG: No results found for this basename: GLUCAP,  in the last 168 hours     Signed:  Lillyian Heidt  [facility] 03/05/2013, 10:01 PM

## 2013-05-02 ENCOUNTER — Ambulatory Visit (INDEPENDENT_AMBULATORY_CARE_PROVIDER_SITE_OTHER)
Admission: RE | Admit: 2013-05-02 | Discharge: 2013-05-02 | Disposition: A | Discharge status: Home / Self Care | Payer: BC Managed Care – PPO | Source: Ambulatory Visit | Attending: Family Medicine | Admitting: Family Medicine

## 2013-05-02 ENCOUNTER — Telehealth: Payer: Self-pay | Admitting: Family Medicine

## 2013-05-02 ENCOUNTER — Ambulatory Visit (INDEPENDENT_AMBULATORY_CARE_PROVIDER_SITE_OTHER): Payer: BC Managed Care – PPO | Admitting: Family Medicine

## 2013-05-02 ENCOUNTER — Encounter: Payer: Self-pay | Admitting: Family Medicine

## 2013-05-02 VITALS — BP 104/70 | HR 93 | Temp 99.0°F | Wt 246.2 lb

## 2013-05-02 DIAGNOSIS — R1012 Left upper quadrant pain: Secondary | ICD-10-CM

## 2013-05-02 DIAGNOSIS — R109 Unspecified abdominal pain: Secondary | ICD-10-CM | POA: Insufficient documentation

## 2013-05-02 LAB — CBC WITH DIFFERENTIAL/PLATELET
Basophils Relative: 0 % (ref 0–1)
Eosinophils Absolute: 0.2 10*3/uL (ref 0.0–0.7)
HCT: 41 % (ref 36.0–46.0)
Hemoglobin: 13.9 g/dL (ref 12.0–15.0)
Lymphs Abs: 2.2 10*3/uL (ref 0.7–4.0)
MCH: 27.6 pg (ref 26.0–34.0)
MCHC: 33.9 g/dL (ref 30.0–36.0)
Monocytes Absolute: 0.8 10*3/uL (ref 0.1–1.0)
Monocytes Relative: 10 % (ref 3–12)
RBC: 5.04 MIL/uL (ref 3.87–5.11)

## 2013-05-02 MED ORDER — TRAMADOL HCL 50 MG PO TABS: 50.0000 mg | ORAL_TABLET | Freq: Three times a day (TID) | ORAL | Status: DC | PRN

## 2013-05-02 MED ORDER — IBUPROFEN 200 MG PO TABS: 600.0000 mg | ORAL_TABLET | Freq: Three times a day (TID) | ORAL | Status: DC | PRN

## 2013-05-02 MED ORDER — GI COCKTAIL ~~LOC~~
30.0000 mL | Freq: Once | ORAL | Status: AC
  Administered 2013-05-02: 30 mL via ORAL

## 2013-05-02 NOTE — Patient Instructions (Addendum)
Take ibuprofen with food and if needed take the tramadol.  It can make you drowsy.  We'll contact you with your lab report. If not improving let us know.

## 2013-05-02 NOTE — Telephone Encounter (Signed)
Seeing [employer]

## 2013-05-02 NOTE — Progress Notes (Signed)
She was improved from viral meningitis.  She was back at work and exercising.  Pain under L breast, along the lower bra line. Going on since yesterday.  Changing bras didn't help.  Pain with cough, sneeze, laughing.  Gradually getting worse over last 24 hours.  No trauma, no trigger know.  No FCNAV.  Mild ST and runny nose.  No sputum.  Minimal cough, not more than baseline. Smoking 3 cigs a day, trying to cut down.  Appetite is decreased but eating/drinking doesn't make it worse.  She has been burping more than typical, and burping doesn't help the pain.  No nsaids. No h/o stomach ulcers.  Took some mylanta about 2 hours ago w/o any change.   Normal BMs, no blood in stool.  No hematemesis.   Meds, vitals, and allergies reviewed.   ROS: See HPI.  Otherwise, noncontributory.  nad but uncomfortable, obese Tm wnl, nasal exam mildly stuffy Mmm OP wnl Neck supple, no LA rrr ctab abd soft, no rebound but ttp in the LUQ along the inferior ribs w/o rash. Normal BS No LLQ, RLQ, RUQ pain Ext well perfused  Not improved with Gi cocktail Xrays reviewed.

## 2013-05-02 NOTE — Assessment & Plan Note (Addendum)
ddx d/w pt.  No ptx, pna.  Likely not gastric given the lack of GI cocktail response.  abd soft, KUB unremarkable.  No urinary sx.  Likely a deep muscle strain in the thorax.  D/w pt.  She agrees.  Okay for outpatient f/u.  Would use nsaid with GI caution with tramadol prn.  She notes that local heating pad application helped some last night.  D/w PCP during OV. >25 min spent with face to face with patient, >50% counseling and/or coordinating care.

## 2013-05-02 NOTE — Telephone Encounter (Signed)
Patient calling about Abdominal Pain,   LMP 2 weeks ago Patient calling about Upper Abdominal Pain that started yesterday afternoon 6/12.  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  Pain across braline - this felt sensitive yesterday but not today.  Pain so severe at work today that she almost felt short of breath.  Is really not feeling well and had to come home from work early today 6/13.  Triaged in Abdominal Pain Guideline - Disposition:  See ED Immediately due to Generalized or Localized Pain made worse with cough, movement, staning up straight.  Appt today in office 2:30 pm Dr Cheree Ditto.

## 2013-05-03 LAB — HEPATIC FUNCTION PANEL
ALT: 15 U/L (ref 0–35)
AST: 14 U/L (ref 0–37)
Alkaline Phosphatase: 59 U/L (ref 39–117)
Bilirubin, Direct: 0.1 mg/dL (ref 0.0–0.3)

## 2013-08-01 ENCOUNTER — Encounter: Payer: Self-pay | Admitting: Family Medicine

## 2013-08-01 ENCOUNTER — Ambulatory Visit (INDEPENDENT_AMBULATORY_CARE_PROVIDER_SITE_OTHER): Payer: BC Managed Care – PPO | Admitting: Family Medicine

## 2013-08-01 VITALS — BP 122/72 | HR 79 | Temp 99.1°F | Ht 69.0 in | Wt 252.2 lb

## 2013-08-01 DIAGNOSIS — J069 Acute upper respiratory infection, unspecified: Secondary | ICD-10-CM | POA: Insufficient documentation

## 2013-08-01 MED ORDER — GUAIFENESIN-CODEINE 100-10 MG/5ML PO SYRP: 5.0000 mL | ORAL_SOLUTION | Freq: Four times a day (QID) | ORAL | Status: DC | PRN

## 2013-08-01 MED ORDER — BENZONATATE 200 MG PO CAPS: 200.0000 mg | ORAL_CAPSULE | Freq: Three times a day (TID) | ORAL | Status: DC | PRN

## 2013-08-01 NOTE — Progress Notes (Signed)
  Subjective:    Patient ID: [name], female    DOB: 1966/06/26, 47 y.o.   MRN: [id number]  HPI Here with uri symptoms Was exposed to children with colds -- woke up yesterday with the ST  Congestion and sore throat and cough  Very persistent cough - all night - took some mucinex DM  (some green phlegm) Getting hoarse  Very low grade fever- she felt a little warm 99.1 now  No change in her ears --has tinnitus/ and a little stuffed    Patient Active Problem List   Diagnosis Date Noted  . Abdominal  pain, other specified site 05/02/2013  . Viral meningitis 02/15/2013  . Headache 10/27/2009   Past Medical History  Diagnosis Date  . Concussion 2008  . Hx of migraines   . Viral meningitis    No past surgical history on file. History  Substance Use Topics  . Smoking status: Current Every Day Smoker -- 0.25 packs/day    Types: Cigarettes  . Smokeless tobacco: Never Used  . Alcohol Use: Yes     Comment: Rare   No family history on file. Allergies  Allergen Reactions  . Vancomycin Rash    Red man reaction   Current Outpatient Prescriptions on File Prior to Visit  Medication Sig Dispense Refill  . b complex vitamins capsule Take 1 capsule by mouth daily.      . cyanocobalamin 1000 MCG tablet Take 100 mcg by mouth daily.      Marland Kitchen ibuprofen (ADVIL) 200 MG tablet Take 3 tablets (600 mg total) by mouth 3 (three) times daily as needed for pain (with food).       No current facility-administered medications on file prior to visit.      Review of Systems Review of Systems  Constitutional: Negative for  appetite change,  and unexpected weight change.  ENT pos for cong and rhinorrhea/ neg for sinus pain  Eyes: Negative for pain and visual disturbance.  Respiratory: Negative for wheeze and shortness of breath.   Cardiovascular: Negative for cp or palpitations    Gastrointestinal: Negative for nausea, diarrhea and constipation.  Genitourinary: Negative for urgency and  frequency.  Skin: Negative for pallor or rash   Neurological: Negative for weakness, light-headedness, numbness and headaches.  Hematological: Negative for adenopathy. Does not bruise/bleed easily.  Psychiatric/Behavioral: Negative for dysphoric mood. The patient is not nervous/anxious.         Objective:   Physical Exam  Constitutional: She appears well-developed and well-nourished. No distress.  obese and well appearing   HENT:  Head: Normocephalic and atraumatic.  Right Ear: External ear normal.  Mouth/Throat: Oropharynx is clear and moist. No oropharyngeal exudate.  Nares are injected and congested  No sinus tenderness  Eyes: Conjunctivae and EOM are normal. Pupils are equal, round, and reactive to light. Right eye exhibits no discharge. Left eye exhibits no discharge.  Neck: Neck supple. No JVD present. Carotid bruit is not present. No thyromegaly present.  Cardiovascular: Normal rate, regular rhythm and normal heart sounds.   Pulmonary/Chest: Effort normal and breath sounds normal. No respiratory distress. She has no wheezes. She has no rales.  Abdominal: She exhibits no abdominal bruit.  Lymphadenopathy:    She has no cervical adenopathy.  Neurological: She is alert.  Skin: Skin is warm and dry. No rash noted.  Psychiatric: She has a normal mood and affect.          Assessment & Plan:

## 2013-08-01 NOTE — Patient Instructions (Addendum)
Drink lots of fluids and get rest  Try tessalon for cough  For night time- the codeine cough syrup  Tylenol for fever as needed

## 2013-08-03 NOTE — Assessment & Plan Note (Signed)
Disc symptomatic care - see instructions on AVS  Reassuring exam  Will try tessalon tid prn cough and guifen-codeine at night  Update if not starting to improve in a week or if worsening

## 2013-09-08 ENCOUNTER — Encounter: Payer: Self-pay | Admitting: Adult Health

## 2013-09-08 ENCOUNTER — Telehealth: Payer: Self-pay | Admitting: Family Medicine

## 2013-09-08 ENCOUNTER — Ambulatory Visit (INDEPENDENT_AMBULATORY_CARE_PROVIDER_SITE_OTHER): Payer: BC Managed Care – PPO | Admitting: Adult Health

## 2013-09-08 VITALS — BP 110/68 | HR 79 | Temp 98.0°F | Resp 12 | Wt 249.5 lb

## 2013-09-08 DIAGNOSIS — H00013 Hordeolum externum right eye, unspecified eyelid: Secondary | ICD-10-CM

## 2013-09-08 DIAGNOSIS — H00019 Hordeolum externum unspecified eye, unspecified eyelid: Secondary | ICD-10-CM

## 2013-09-08 MED ORDER — ERYTHROMYCIN 5 MG/GM OP OINT: TOPICAL_OINTMENT | Freq: Four times a day (QID) | OPHTHALMIC | Status: DC

## 2013-09-08 MED ORDER — CEPHALEXIN 500 MG PO CAPS: 500.0000 mg | ORAL_CAPSULE | Freq: Two times a day (BID) | ORAL | Status: DC

## 2013-09-08 NOTE — Patient Instructions (Signed)
  Continue warm compresses.  Start erythromycin ophthalmic ointment every 6 hours to the affected area for 7 days.  Start Keflex 500 mg twice daily for 7 days.  Please schedule an appointment with your ophthalmologist if no improvement within 2-3 days.

## 2013-09-08 NOTE — Progress Notes (Signed)
  Subjective:    Patient ID: [name], female    DOB: 03-11-1966, 47 y.o.   MRN: [id number]  HPI  Patient is a pleasant 47 year old female who presents to clinic with stye in the right lower lid. She saw her ophthalmologist last week and was started on tobramycin drops 4 times a day. The area has not improved. It has become inflamed and reddened. There is no drainage from this site. Patient tried to get back in to see the ophthalmologist but did not have an appointment until tomorrow. She wanted this to be evaluated. She denies any visual disturbance. The pain is localized at the site of the stye. She is beginning to experience some tenderness around the orbital area. Patient has been applying warm compresses to the area approximately 4 times a day.   Current Outpatient Prescriptions on File Prior to Visit  Medication Sig Dispense Refill  . b complex vitamins capsule Take 1 capsule by mouth daily.      . cyanocobalamin 1000 MCG tablet Take 100 mcg by mouth daily.       No current facility-administered medications on file prior to visit.     Review of Systems  Eyes: Positive for pain and redness. Negative for photophobia, discharge, itching and visual disturbance.       Objective:   Physical Exam  Eyes: Conjunctivae and EOM are normal. Pupils are equal, round, and reactive to light.  Stye on the right lower lid. The area is erythematous and swollen.          Assessment & Plan:

## 2013-09-08 NOTE — Telephone Encounter (Signed)
Patient Information:  Caller Name: [place]  Phone: [phone]  Patient: [name], [name]  Gender: Female  DOB: 1966/11/15  Age: 47 Years  PCP: [facility], [facility], [facility])  Pregnant: No  Office Follow Up:  Does the office need to follow up with this patient?: No  Instructions For The Office: N/A  RN Note:  Vision mildy blurred due to lump/sty. No appointments remain within 4 hours at [facility].  Scheduled for 1115 09/08/13 at [facility] office with Christy Gentles, NP.   Symptoms  Reason For Call & Symptoms: Sty on right lower eyelid with dark purple/red color on lower lid and pain to temple.    Reviewed Health History In EMR: Yes  Reviewed Medications In EMR: Yes  Reviewed Allergies In EMR: Yes  Reviewed Surgeries / Procedures: Yes  Date of Onset of Symptoms: 09/03/2013  Treatments Tried: Tobramycin/Dexamethasone eye gtts from [facility] QID, warm compresses frequently.  Treatments Tried Worked: No OB / GYN:  LMP: 08/10/2013  Guideline(s) Used:  Eye - Red Without Pus  Disposition Per Guideline:   Go to Office Now  Reason For Disposition Reached:   Blurred vision  Advice Given:  Call Back If:  Blurred vision or increasing pain  No improvement  You become worse.  Patient Will Follow Care Advice:  YES

## 2013-09-08 NOTE — Assessment & Plan Note (Signed)
Start erythromycin 0.5% ophthalmic ointment to the right eye 4 times a day. Continue with warm compresses. Also start Keflex 500 mg twice a day x7 days. If no improvement within 2-3 days needs to followup with ophthalmologist.

## 2013-09-25 ENCOUNTER — Other Ambulatory Visit: Payer: Self-pay

## 2013-12-18 ENCOUNTER — Telehealth: Payer: Self-pay | Admitting: Family Medicine

## 2013-12-18 ENCOUNTER — Emergency Department: Payer: Self-pay | Admitting: Emergency Medicine

## 2013-12-18 LAB — COMPREHENSIVE METABOLIC PANEL
ALT: 24 U/L (ref 12–78)
Albumin: 3.8 g/dL (ref 3.4–5.0)
Alkaline Phosphatase: 76 U/L
Anion Gap: 3 — ABNORMAL LOW (ref 7–16)
BILIRUBIN TOTAL: 0.2 mg/dL (ref 0.2–1.0)
BUN: 19 mg/dL — ABNORMAL HIGH (ref 7–18)
CALCIUM: 9.6 mg/dL (ref 8.5–10.1)
CO2: 30 mmol/L (ref 21–32)
CREATININE: 1.1 mg/dL (ref 0.60–1.30)
Chloride: 105 mmol/L (ref 98–107)
EGFR (African American): >60
EGFR (Non-African Amer.): >60
Glucose: 97 mg/dL (ref 65–99)
Osmolality: 278 (ref 275–301)
POTASSIUM: 4.7 mmol/L (ref 3.5–5.1)
SGOT(AST): 27 U/L (ref 15–37)
SODIUM: 138 mmol/L (ref 136–145)
TOTAL PROTEIN: 7.8 g/dL (ref 6.4–8.2)

## 2013-12-18 LAB — TSH: THYROID STIMULATING HORM: 2.31 u[IU]/mL

## 2013-12-18 LAB — URINALYSIS, COMPLETE
BLOOD: NEGATIVE
Bilirubin,UR: NEGATIVE
GLUCOSE, UR: NEGATIVE mg/dL (ref 0–75)
Ketone: NEGATIVE
LEUKOCYTE ESTERASE: NEGATIVE
Nitrite: NEGATIVE
PH: 6 (ref 4.5–8.0)
PROTEIN: NEGATIVE
RBC,UR: 1 /HPF (ref 0–5)
SPECIFIC GRAVITY: 1.013 (ref 1.003–1.030)
WBC UR: 1 /HPF (ref 0–5)

## 2013-12-18 LAB — CBC WITH DIFFERENTIAL/PLATELET
Basophil #: 0.1 10*3/uL (ref 0.0–0.1)
Basophil %: 0.7 %
EOS PCT: 3.9 %
Eosinophil #: 0.4 10*3/uL (ref 0.0–0.7)
HCT: 42.6 % (ref 35.0–47.0)
HGB: 13.7 g/dL (ref 12.0–16.0)
LYMPHS ABS: 4 10*3/uL — AB (ref 1.0–3.6)
Lymphocyte %: 37.1 %
MCH: 27.4 pg (ref 26.0–34.0)
MCHC: 32.1 g/dL (ref 32.0–36.0)
MCV: 86 fL (ref 80–100)
MONO ABS: 0.6 x10 3/mm (ref 0.2–0.9)
MONOS PCT: 6.1 %
NEUTROS ABS: 5.6 10*3/uL (ref 1.4–6.5)
NEUTROS PCT: 52.2 %
Platelet: 256 10*3/uL (ref 150–440)
RBC: 4.98 10*6/uL (ref 3.80–5.20)
RDW: 14 % (ref 11.5–14.5)
WBC: 10.7 10*3/uL (ref 3.6–11.0)

## 2013-12-18 NOTE — Telephone Encounter (Signed)
Patient Information:  Caller Name: Jovita GammaLark  Phone: [phone]  Patient: Oswald HillockJohnston, Azhane R  Gender: Female  DOB: [birth year]  Age: [age] Years  [facility]  Pregnant: No  Office Follow Up:  Does the office need to follow up with this patient?: No  Instructions For The Office: N/A  RN Note:  Advised patient since office is now closed and she has recent history of meningitis to [facility]. Patient verbalized understanding.  Symptoms  Reason For Call & Symptoms: Reports headache, back ache, fever, neck ache. Reports pain in back gets worse when she ambulates like when she had meningitis previously.  Reviewed Health History In EMR: Yes  Reviewed Medications In EMR: Yes  Reviewed Allergies In EMR: Yes  Reviewed Surgeries / Procedures: Yes  Date of Onset of Symptoms: 12/15/2013  Any Fever: Yes  Fever Taken: Oral  Fever Time Of Reading: 16:45:36  Fever Last Reading: 100.0 OB / GYN:  LMP: 11/20/2013  Guideline(s) Used:  Neck Pain or Stiffness  Disposition Per Guideline:   Home Care  Reason For Disposition Reached:   Neck pain or stiffness  Advice Given:  N/A  Patient Will Follow Care Advice:  YES

## 2013-12-19 ENCOUNTER — Ambulatory Visit: Payer: BC Managed Care – PPO | Admitting: Internal Medicine

## 2013-12-19 NOTE — Telephone Encounter (Signed)
Please get ER notes when avail -thanks

## 2013-12-22 NOTE — Telephone Encounter (Signed)
Faxed [facility] medical [facility] dpt.

## 2013-12-22 NOTE — Telephone Encounter (Signed)
Received ER notes and placed in your inbox

## 2014-10-29 ENCOUNTER — Encounter: Payer: Self-pay | Admitting: Internal Medicine

## 2014-10-29 ENCOUNTER — Ambulatory Visit (INDEPENDENT_AMBULATORY_CARE_PROVIDER_SITE_OTHER): Payer: BC Managed Care – PPO | Admitting: Internal Medicine

## 2014-10-29 VITALS — BP 118/72 | HR 76 | Temp 98.0°F | Wt 257.0 lb

## 2014-10-29 DIAGNOSIS — J309 Allergic rhinitis, unspecified: Secondary | ICD-10-CM

## 2014-10-29 NOTE — Patient Instructions (Signed)
Cough, Adult  A cough is a reflex that helps clear your throat and airways. It can help heal the body or may be a reaction to an irritated airway. A cough may only last 2 or 3 weeks (acute) or may last more than 8 weeks (chronic).  CAUSES Acute cough:  Viral or bacterial infections. Chronic cough:  Infections.  Allergies.  Asthma.  Post-nasal drip.  Smoking.  Heartburn or acid reflux.  Some medicines.  Chronic lung problems (COPD).  Cancer. SYMPTOMS   Cough.  Fever.  Chest pain.  Increased breathing rate.  High-pitched whistling sound when breathing (wheezing).  Colored mucus that you cough up (sputum). TREATMENT   A bacterial cough may be treated with antibiotic medicine.  A viral cough must run its course and will not respond to antibiotics.  Your caregiver may recommend other treatments if you have a chronic cough. HOME CARE INSTRUCTIONS   Only take over-the-counter or prescription medicines for pain, discomfort, or fever as directed by your caregiver. Use cough suppressants only as directed by your caregiver.  Use a cold steam vaporizer or humidifier in your bedroom or home to help loosen secretions.  Sleep in a semi-upright position if your cough is worse at night.  Rest as needed.  Stop smoking if you smoke. SEEK IMMEDIATE MEDICAL CARE IF:   You have pus in your sputum.  Your cough starts to worsen.  You cannot control your cough with suppressants and are losing sleep.  You begin coughing up blood.  You have difficulty breathing.  You develop pain which is getting worse or is uncontrolled with medicine.  You have a fever. MAKE SURE YOU:   Understand these instructions.  Will watch your condition.  Will get help right away if you are not doing well or get worse. Document Released: 05/05/2011 Document Revised: 01/29/2012 Document Reviewed: 05/05/2011 ExitCare Patient Information 2015 [employer]. This information is not intended  to replace advice given to you by your health care provider. Make sure you discuss any questions you have with your health care provider.  

## 2014-10-29 NOTE — Progress Notes (Signed)
Pre visit review using our clinic review tool, if applicable. No additional management support is needed unless otherwise documented below in the visit note. 

## 2014-10-29 NOTE — Progress Notes (Signed)
HPI  Pt presents to the clinic today with c/o nasal congestion, sneezing and cough. This started 5 days ago. The cough is non productive but she reports she did cough up green mucous, but only once. She denies fever, chills or body aches. She did try peppermint oil OTC with some relief. She does have a history of seasonal allergies but denies asthma or breathing problems. She has not had sick contacts that she is aware of.  Review of Systems      Past Medical History  Diagnosis Date  . Concussion 2008  . Hx of migraines   . Viral meningitis     No family history on file.  History   Social History  . Marital Status: Married    Spouse Name: N/A    Number of Children: N/A  . Years of Education: N/A   Occupational History  . [employer] in [employer]    Social History Main Topics  . Smoking status: Former Smoker -- 0.25 packs/day    Types: Cigarettes    Quit date: 08/25/2013  . Smokeless tobacco: Never Used  . Alcohol Use: 0.0 oz/week    0 Not specified per week     Comment: Rare  . Drug Use: No  . Sexual Activity: Not on file   Other Topics Concern  . Not on file   Social History Narrative    Allergies  Allergen Reactions  . Vancomycin Rash    Red man reaction     Constitutional: Denies headache, fatigue and fever.  HEENT:  Positive nasal congestion. Denies eye redness, eye pain, pressure behind the eyes, facial pain,  ear pain, ringing in the ears, wax buildup, runny nose or sore throat. Respiratory: Positive cough. Denies difficulty breathing or shortness of breath.  Cardiovascular: Denies chest pain, chest tightness, palpitations or swelling in the hands or feet.   No other specific complaints in a complete review of systems (except as listed in HPI above).  Objective:   BP 118/72 mmHg  Pulse 76  Temp(Src) 98 F (36.7 C) (Oral)  Wt 257 lb (116.574 kg)  SpO2 99%  Wt Readings from Last 3 Encounters:  10/29/14 257 lb (116.574 kg)  09/08/13  249 lb 8 oz (113.172 kg)  08/01/13 252 lb 4 oz (114.42 kg)     General: Appears her stated age, obese but well developed, well nourished in NAD. HEENT: Head: normal shape and size, no sinus tenderness noted;  Ears: Tm's gray and intact, normal light reflex; Nose: mucosa boggy and moist, septum midline; Throat/Mouth: + PND. Teeth present, mucosa pink and moist, no exudate noted, no lesions or ulcerations noted. No adenopathy noted. Cardiovascular: Normal rate and rhythm. S1,S2 noted.  No murmur, rubs or gallops noted.  Pulmonary/Chest: Normal effort and positive vesicular breath sounds. No respiratory distress. No wheezes, rales or ronchi noted.      Assessment & Plan:   Allergic Rhinitis:  Get some rest and drink plenty of water Start Zyrtec and Flonase OTC for congestion/sneezing Can take some Mucinex for the cough  RTC as needed or if symptoms persist.

## 2015-03-24 ENCOUNTER — Telehealth: Payer: Self-pay

## 2015-03-24 ENCOUNTER — Ambulatory Visit (INDEPENDENT_AMBULATORY_CARE_PROVIDER_SITE_OTHER): Payer: BLUE CROSS/BLUE SHIELD | Admitting: Primary Care

## 2015-03-24 ENCOUNTER — Encounter: Payer: Self-pay | Admitting: Primary Care

## 2015-03-24 VITALS — BP 124/70 | HR 76 | Temp 97.0°F | Ht 69.0 in | Wt 259.4 lb

## 2015-03-24 DIAGNOSIS — R202 Paresthesia of skin: Secondary | ICD-10-CM | POA: Diagnosis not present

## 2015-03-24 DIAGNOSIS — R200 Anesthesia of skin: Secondary | ICD-10-CM

## 2015-03-24 NOTE — Progress Notes (Signed)
Pre visit review using our clinic review tool, if applicable. No additional management support is needed unless otherwise documented below in the visit note. 

## 2015-03-24 NOTE — Telephone Encounter (Signed)
Pt thought she had appt today at 12:30 but the computer actually made appt on 03/26/15 at 12:30 pm;  Pt said H/A has gone but still has numbness in rt hand, feet and possibly in rt side of face. Pt said has not had these symptoms with a migraine in 20 years. Spoke with Dr Milinda Antisower and no available appt with her but advised pt did need to be seen today and pt scheduled today at 2:30 pm with Mayra ReelKate Clark NP. If pt condition changes or worsens prior to appt pt will go to [facility]. [employer] Gift card given to pt. And pt was appreciative.

## 2015-03-24 NOTE — Progress Notes (Signed)
Subjective:    Patient ID: [name], female    DOB: 06-27-1966, [age] y.o.   MRN: [id number]  HPI  [name] is a 49 year old female who presents today with a chief complaint numbness/tingling to bilateral fingers and toes that began this morning, with the most bothersome location being her right wrist and fingers. She  woke up this morning with "horrible" headache along with the numbness and tingling. She took some excedrin Migraine this morning and headache is now gone, but the numbness/tingling remains. She has a history of chronic migraines (last one 20 years ago) and experienced similar symptoms of numbness and tingling during her episodes. She also experienced auras, but had no aura today. She describes her headache as throbbing and squeezing to her forehead.   Review of Systems  HENT: Positive for rhinorrhea and sinus pressure.   Respiratory: Negative for shortness of breath.   Cardiovascular: Negative for chest pain.  Gastrointestinal: Negative for nausea and vomiting.  Allergic/Immunologic: Positive for environmental allergies.  Neurological: Positive for numbness and headaches. Negative for dizziness, tremors, weakness and light-headedness.       Past Medical History  Diagnosis Date  . Concussion 2008  . Hx of migraines   . Viral meningitis     History   Social History  . Marital Status: Married    Spouse Name: N/A  . Number of Children: N/A  . Years of Education: N/A   Occupational History  . [employer] in [employer]    Social History Main Topics  . Smoking status: Current Every Day Smoker -- 0.25 packs/day    Types: Cigarettes    Last Attempt to Quit: 08/25/2013  . Smokeless tobacco: Never Used  . Alcohol Use: 0.0 oz/week    0 Standard drinks or equivalent per week     Comment: Rare  . Drug Use: No  . Sexual Activity: Not on file   Other Topics Concern  . Not on file   Social History Narrative    No past surgical history on  file.  No family history on file.  Allergies  Allergen Reactions  . Vancomycin Rash    Red man reaction    No current outpatient prescriptions on file prior to visit.   No current facility-administered medications on file prior to visit.    BP 124/70 mmHg  Pulse 76  Temp(Src) 97 F (36.1 C) (Oral)  Ht 5\' 9"  (1.753 m)  Wt 259 lb 6.4 oz (117.663 kg)  BMI 38.29 kg/m2  SpO2 97%  LMP 03/08/2015    Objective:   Physical Exam  Constitutional: She is oriented to person, place, and time. She appears well-developed.  Eyes: EOM are normal. Pupils are equal, round, and reactive to light.  Cardiovascular: Normal rate and regular rhythm.   Pulmonary/Chest: Effort normal and breath sounds normal.  Neurological: She is alert and oriented to person, place, and time. She has normal strength and normal reflexes. No cranial nerve deficit or sensory deficit. She displays a negative Romberg sign. Coordination normal.  Skin: Skin is warm and dry.  Psychiatric: She has a normal mood and affect.          Assessment & Plan:  Migraine:  Suspect the numbness/tingling is residual from her Migraine since she had similar symptoms in the past. Neuro exam negative. Increase water intake as she's only drinking 3-4 bottles of water daily. Instructed patient to notify me by Friday if symptoms have not resolved. If no resolution,  then will consider further work up with labs.

## 2015-03-24 NOTE — Patient Instructions (Signed)
Your symptoms are likely related to your headache this morning. You will need to increase your water consumption over the next several days to ensure hydration. Call me if symptoms do no resolve by Friday.  It was very nice meeting you!

## 2015-03-26 ENCOUNTER — Ambulatory Visit: Payer: Self-pay | Admitting: Family Medicine

## 2015-05-07 ENCOUNTER — Emergency Department
Admission: EM | Admit: 2015-05-07 | Discharge: 2015-05-07 | Disposition: A | Discharge status: Home / Self Care | Payer: BLUE CROSS/BLUE SHIELD | Attending: Emergency Medicine | Admitting: Emergency Medicine

## 2015-05-07 ENCOUNTER — Emergency Department: Payer: BLUE CROSS/BLUE SHIELD

## 2015-05-07 ENCOUNTER — Encounter: Payer: Self-pay | Admitting: Emergency Medicine

## 2015-05-07 DIAGNOSIS — Y9289 Other specified places as the place of occurrence of the external cause: Secondary | ICD-10-CM | POA: Diagnosis not present

## 2015-05-07 DIAGNOSIS — Y9389 Activity, other specified: Secondary | ICD-10-CM | POA: Insufficient documentation

## 2015-05-07 DIAGNOSIS — W1839XA Other fall on same level, initial encounter: Secondary | ICD-10-CM | POA: Insufficient documentation

## 2015-05-07 DIAGNOSIS — S63501A Unspecified sprain of right wrist, initial encounter: Secondary | ICD-10-CM

## 2015-05-07 DIAGNOSIS — Z79899 Other long term (current) drug therapy: Secondary | ICD-10-CM | POA: Insufficient documentation

## 2015-05-07 DIAGNOSIS — Y998 Other external cause status: Secondary | ICD-10-CM | POA: Diagnosis not present

## 2015-05-07 DIAGNOSIS — Z720 Tobacco use: Secondary | ICD-10-CM | POA: Diagnosis not present

## 2015-05-07 DIAGNOSIS — S6991XA Unspecified injury of right wrist, hand and finger(s), initial encounter: Secondary | ICD-10-CM | POA: Diagnosis present

## 2015-05-07 NOTE — ED Provider Notes (Signed)
[facility] Emergency Department Provider Note  ____________________________________________  Time seen: Approximately 10:38 PM  I have reviewed the triage vital signs and the nursing notes.   HISTORY  Chief Complaint Wrist Pain    HPI [name] is a 49 y.o. female who fell a few minutes prior to arrival hand was outstretched and extended now she is having pain in her distal forearm and in her wrists she is concerned about fracture rates pain as about a 5 out of 10 any type of movement seems to make it worse holding it still seems to make it better no other complaints at this time denies numbness tingling weakness or any other problems of note   Past Medical History  Diagnosis Date  . Concussion 2008  . Hx of migraines   . Viral meningitis     Patient Active Problem List   Diagnosis Date Noted  . Viral meningitis 02/15/2013    No past surgical history on file.  Current Outpatient Rx  Name  Route  Sig  Dispense  Refill  . vitamin B-12 (CYANOCOBALAMIN) 1000 MCG tablet   Oral   Take 1,000 mcg by mouth daily.           Allergies Vancomycin  No family history on file.  Social History History  Substance Use Topics  . Smoking status: Current Every Day Smoker -- 0.25 packs/day for 3 years    Types: Cigarettes    Last Attempt to Quit: 08/25/2013  . Smokeless tobacco: Never Used  . Alcohol Use: 0.6 oz/week    0 Standard drinks or equivalent, 1 Cans of beer per week    Review of Systems Constitutional: No fever/chills Eyes: No visual changes. ENT: No sore throat. Cardiovascular: Denies chest pain. Respiratory: Denies shortness of breath. Gastrointestinal: No abdominal pain.  No nausea, no vomiting.  No diarrhea.  No constipation. Genitourinary: Negative for dysuria. Musculoskeletal: Negative for back pain. Skin: Negative for rash. Neurological: Negative for headaches, focal weakness or numbness.  10-point ROS otherwise  negative.  ____________________________________________   PHYSICAL EXAM:  VITAL SIGNS: ED Triage Vitals  Enc Vitals Group     BP 05/07/15 2222 133/64 mmHg     Pulse Rate 05/07/15 2222 72     Resp 05/07/15 2222 18     Temp 05/07/15 2222 98.1 F (36.7 C)     Temp Source 05/07/15 2222 Oral     SpO2 05/07/15 2222 99 %     Weight 05/07/15 2222 250 lb (113.399 kg)     Height 05/07/15 2222  (1.753 m)     Head Cir --      Peak Flow --      Pain Score 05/07/15 2222 7     Pain Loc --      Pain Edu? --      Excl. in GC? --     Constitutional: Alert and oriented. Well appearing and in no acute distress. Eyes: Conjunctivae are normal. PERRL. EOMI. Head: Atraumatic. Nose: No congestion/rhinnorhea. Mouth/Throat: Mucous membranes are moist.  Oropharynx non-erythematous. Neck: No stridor.   Cardiovascular: Normal rate, regular rhythm. Grossly normal heart sounds.  Good peripheral circulation. Respiratory: Normal respiratory effort.  No retractions. Lungs CTAB.  Musculoskeletal: Pain with palpation to her right dorsal distal forearm with mild swelling to the area full range of motion passively limited range of motion with pronation of the wrist good cap refill distally no functional deficit of the hand or fingers Neurologic:  Normal speech  and language. No gross focal neurologic deficits are appreciated. Speech is normal. No gait instability. Skin:  Skin is warm, dry and intact. No rash noted. Abrasions to the palm of the left hand Psychiatric: Mood and affect are normal. Speech and behavior are normal.  ____________________________________________     RADIOLOGY  X-ray of right wrist negative ____________________________________________   PROCEDURES  Procedure(s) performed: Patient placed Velcro wrist splint  Critical Care performed: No  ____________________________________________   INITIAL IMPRESSION / ASSESSMENT AND PLAN / ED COURSE  Pertinent labs & imaging  results that were available during my care of the patient were reviewed by me and considered in my medical decision making (see chart for details).  Impression this patient right wrist sprain mild tenderness to the muscles distal forearm no palpable deformity abnormality otherwise full range of motion flexion extension rotation good distal pulses good radial and ulnar pulses good sensation throughout the hand feel comfortable sending this patient home in a splint cryotherapy and Motrin return here for any acute concerns or worsening symptoms ____________________________________________   FINAL CLINICAL IMPRESSION(S) / ED DIAGNOSES  Final diagnoses:  Wrist sprain, right, initial encounter     Coolidge Gossard Rosalyn Gess, PA-C 05/07/15 2300  Darien Ramus, MD 05/07/15 2340

## 2015-05-07 NOTE — ED Notes (Signed)
Pt reports pain to right wrist after a fall.  Pt denies numbness tingling.  Pt able to move fingers.  CSM intact.

## 2015-05-07 NOTE — Discharge Instructions (Signed)
Cryotherapy °Cryotherapy means treatment with cold. Ice or gel packs can be used to reduce both pain and swelling. Ice is the most helpful within the first 24 to 48 hours after an injury or flare-up from overusing a muscle or joint. Sprains, strains, spasms, burning pain, shooting pain, and aches can all be eased with ice. Ice can also be used when recovering from surgery. Ice is effective, has very few side effects, and is safe for most people to use. °PRECAUTIONS  °Ice is not a safe treatment option for people with: °· Raynaud phenomenon. This is a condition affecting small blood vessels in the extremities. Exposure to cold may cause your problems to return. °· Cold hypersensitivity. There are many forms of cold hypersensitivity, including: °¨ Cold urticaria. Red, itchy hives appear on the skin when the tissues begin to warm after being iced. °¨ Cold erythema. This is a red, itchy rash caused by exposure to cold. °¨ Cold hemoglobinuria. Red blood cells break down when the tissues begin to warm after being iced. The hemoglobin that carry oxygen are passed into the urine because they cannot combine with blood proteins fast enough. °· Numbness or altered sensitivity in the area being iced. °If you have any of the following conditions, do not use ice until you have discussed cryotherapy with your caregiver: °· Heart conditions, such as arrhythmia, angina, or chronic heart disease. °· High blood pressure. °· Healing wounds or open skin in the area being iced. °· Current infections. °· Rheumatoid arthritis. °· Poor circulation. °· Diabetes. °Ice slows the blood flow in the region it is applied. This is beneficial when trying to stop inflamed tissues from spreading irritating chemicals to surrounding tissues. However, if you expose your skin to cold temperatures for too long or without the proper protection, you can damage your skin or nerves. Watch for signs of skin damage due to cold. °HOME CARE INSTRUCTIONS °Follow  these tips to use ice and cold packs safely. °· Place a dry or damp towel between the ice and skin. A damp towel will cool the skin more quickly, so you may need to shorten the time that the ice is used. °· For a more rapid response, add gentle compression to the ice. °· Ice for no more than 10 to 20 minutes at a time. The bonier the area you are icing, the less time it will take to get the benefits of ice. °· Check your skin after 5 minutes to make sure there are no signs of a poor response to cold or skin damage. °· Rest 20 minutes or more between uses. °· Once your skin is numb, you can end your treatment. You can test numbness by very lightly touching your skin. The touch should be so light that you do not see the skin dimple from the pressure of your fingertip. When using ice, most people will feel these normal sensations in this order: cold, burning, aching, and numbness. °· Do not use ice on someone who cannot communicate their responses to pain, such as small children or people with dementia. °HOW TO MAKE AN ICE PACK °Ice packs are the most common way to use ice therapy. Other methods include ice massage, ice baths, and cryosprays. Muscle creams that cause a cold, tingly feeling do not offer the same benefits that ice offers and should not be used as a substitute unless recommended by your caregiver. °To make an ice pack, do one of the following: °· Place crushed ice or a   bag of frozen vegetables in a sealable plastic bag. Squeeze out the excess air. Place this bag inside another plastic bag. Slide the bag into a pillowcase or place a damp towel between your skin and the bag. °· Mix 3 parts water with 1 part rubbing alcohol. Freeze the mixture in a sealable plastic bag. When you remove the mixture from the freezer, it will be slushy. Squeeze out the excess air. Place this bag inside another plastic bag. Slide the bag into a pillowcase or place a damp towel between your skin and the bag. °SEEK MEDICAL CARE  IF: °· You develop white spots on your skin. This may give the skin a blotchy (mottled) appearance. °· Your skin turns blue or pale. °· Your skin becomes waxy or hard. °· Your swelling gets worse. °MAKE SURE YOU:  °· Understand these instructions. °· Will watch your condition. °· Will get help right away if you are not doing well or get worse. °Document Released: 07/03/2011 Document Revised: 03/23/2014 Document Reviewed: 07/03/2011 °ExitCare® Patient Information ©2015 [employer]. This information is not intended to replace advice given to you by your health care provider. Make sure you discuss any questions you have with your health care provider. ° °

## 2015-05-07 NOTE — ED Notes (Signed)
Patient states that she slipped and fell and now pain and swelling to right wrist.

## 2015-05-10 ENCOUNTER — Telehealth: Payer: Self-pay

## 2015-05-10 NOTE — Telephone Encounter (Signed)
Pt seen [facility] ED on 05/07/15.

## 2015-05-10 NOTE — Telephone Encounter (Signed)
Routed to PCP 
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
## 2015-09-29 ENCOUNTER — Ambulatory Visit (INDEPENDENT_AMBULATORY_CARE_PROVIDER_SITE_OTHER): Payer: BLUE CROSS/BLUE SHIELD | Admitting: Primary Care

## 2015-09-29 ENCOUNTER — Encounter: Payer: Self-pay | Admitting: Primary Care

## 2015-09-29 VITALS — BP 126/80 | HR 74 | Temp 97.8°F | Ht 69.0 in | Wt 266.8 lb

## 2015-09-29 DIAGNOSIS — R05 Cough: Secondary | ICD-10-CM

## 2015-09-29 DIAGNOSIS — R059 Cough, unspecified: Secondary | ICD-10-CM

## 2015-09-29 MED ORDER — HYDROCODONE-HOMATROPINE 5-1.5 MG/5ML PO SYRP: 5.0000 mL | ORAL_SOLUTION | Freq: Every evening | ORAL | Status: DC | PRN

## 2015-09-29 MED ORDER — DOXYCYCLINE HYCLATE 100 MG PO TABS: 100.0000 mg | ORAL_TABLET | Freq: Two times a day (BID) | ORAL | Status: DC

## 2015-09-29 NOTE — Progress Notes (Signed)
Pre visit review using our clinic review tool, if applicable. No additional management support is needed unless otherwise documented below in the visit note. 

## 2015-09-29 NOTE — Patient Instructions (Signed)
Start Doxycycline antibiotics. Take 1 tablet by mouth twice daily for 7 days.   You may take the Hycodan at bedtime as needed for cough and sleep.  You may take the Tessalon Pearls during the day for daytime cough. Continue prednisone.  If you develop nasal congestion, you may use Fluticasone (flonase) nasal spray. Instill 2 sprays in each nostril once daily.  It was a pleasure meeting you!  Acute Bronchitis Bronchitis is inflammation of the airways that extend from the windpipe into the lungs (bronchi). The inflammation often causes mucus to develop. This leads to a cough, which is the most common symptom of bronchitis.  In acute bronchitis, the condition usually develops suddenly and goes away over time, usually in a couple weeks. Smoking, allergies, and asthma can make bronchitis worse. Repeated episodes of bronchitis may cause further lung problems.  CAUSES Acute bronchitis is most often caused by the same virus that causes a cold. The virus can spread from person to person (contagious) through coughing, sneezing, and touching contaminated objects. SIGNS AND SYMPTOMS   Cough.   Fever.   Coughing up mucus.   Body aches.   Chest congestion.   Chills.   Shortness of breath.   Sore throat.  DIAGNOSIS  Acute bronchitis is usually diagnosed through a physical exam. Your health care provider will also ask you questions about your medical history. Tests, such as chest X-rays, are sometimes done to rule out other conditions.  TREATMENT  Acute bronchitis usually goes away in a couple weeks. Oftentimes, no medical treatment is necessary. Medicines are sometimes given for relief of fever or cough. Antibiotic medicines are usually not needed but may be prescribed in certain situations. In some cases, an inhaler may be recommended to help reduce shortness of breath and control the cough. A cool mist vaporizer may also be used to help thin bronchial secretions and make it easier to  clear the chest.  HOME CARE INSTRUCTIONS  Get plenty of rest.   Drink enough fluids to keep your urine clear or pale yellow (unless you have a medical condition that requires fluid restriction). Increasing fluids may help thin your respiratory secretions (sputum) and reduce chest congestion, and it will prevent dehydration.   Take medicines only as directed by your health care provider.  If you were prescribed an antibiotic medicine, finish it all even if you start to feel better.  Avoid smoking and secondhand smoke. Exposure to cigarette smoke or irritating chemicals will make bronchitis worse. If you are a smoker, consider using nicotine gum or skin patches to help control withdrawal symptoms. Quitting smoking will help your lungs heal faster.   Reduce the chances of another bout of acute bronchitis by washing your hands frequently, avoiding people with cold symptoms, and trying not to touch your hands to your mouth, nose, or eyes.   Keep all follow-up visits as directed by your health care provider.  SEEK MEDICAL CARE IF: Your symptoms do not improve after 1 week of treatment.  SEEK IMMEDIATE MEDICAL CARE IF:  You develop an increased fever or chills.   You have chest pain.   You have severe shortness of breath.  You have bloody sputum.   You develop dehydration.  You faint or repeatedly feel like you are going to pass out.  You develop repeated vomiting.  You develop a severe headache. MAKE SURE YOU:   Understand these instructions.  Will watch your condition.  Will get help right away if you are not doing  well or get worse.   This information is not intended to replace advice given to you by your health care provider. Make sure you discuss any questions you have with your health care provider.   Document Released: 12/14/2004 Document Revised: 11/27/2014 Document Reviewed: 04/29/2013 Elsevier Interactive Patient Education [employer].

## 2015-09-29 NOTE — Progress Notes (Signed)
Subjective:    Patient ID: [name], female    DOB: [birth year], 49 y.o.   MRN: [id number]  HPI  [name] is a 49 year old female who presents today with a chief complaint of nasal congestion. She also reports productive cough with greenish sputum, headache, low grade fever. Her symptoms began Tuesday 1 week ago. She was evaluated at Urgent Care last Thursday for same symptoms, provided with prednisone and tessalon pearls. She's feeling worse overall as her cough has become very bothersome. She's also been taking Dayquil, Nyquil with temporary relief.   Review of Systems  Constitutional: Positive for fever and chills.  HENT: Positive for congestion, sinus pressure and sore throat. Negative for ear pain and postnasal drip.   Respiratory: Positive for cough. Negative for shortness of breath.   Cardiovascular: Negative for chest pain.  Musculoskeletal: Positive for myalgias.  Neurological: Positive for headaches.       Past Medical History  Diagnosis Date  . Concussion 2008  . Hx of migraines   . Viral meningitis     Social History   Social History  . Marital Status: Married    Spouse Name: N/A  . Number of Children: N/A  . Years of Education: N/A   Occupational History  . [employer] - in [employer]    Social History Main Topics  . Smoking status: Current Every Day Smoker -- 0.25 packs/day for 3 years    Types: Cigarettes    Last Attempt to Quit: 08/25/2013  . Smokeless tobacco: Never Used  . Alcohol Use: 0.6 oz/week    0 Standard drinks or equivalent, 1 Cans of beer per week  . Drug Use: No  . Sexual Activity: Not on file   Other Topics Concern  . Not on file   Social History Narrative    No past surgical history on file.  No family history on file.  Allergies  Allergen Reactions  . Vancomycin Rash    Red man reaction    Current Outpatient Prescriptions on File Prior to Visit  Medication Sig Dispense Refill  . vitamin B-12  (CYANOCOBALAMIN) 1000 MCG tablet Take 1,000 mcg by mouth daily.     No current facility-administered medications on file prior to visit.    BP 126/80 mmHg  Pulse 74  Temp(Src) 97.8 F (36.6 C) (Oral)  Ht  (1.753 m)  Wt 266 lb 12.8 oz (121.02 kg)  BMI 39.38 kg/m2  SpO2 97%  LMP 08/25/2015    Objective:   Physical Exam  Constitutional: She appears well-nourished.  HENT:  Right Ear: Tympanic membrane is bulging. Tympanic membrane is not erythematous.  Left Ear: Tympanic membrane is not erythematous and not bulging.  Nose: Right sinus exhibits maxillary sinus tenderness. Right sinus exhibits no frontal sinus tenderness. Left sinus exhibits maxillary sinus tenderness. Left sinus exhibits no frontal sinus tenderness.  Mouth/Throat: Posterior oropharyngeal erythema present. No oropharyngeal exudate or posterior oropharyngeal edema.  Neck: Neck supple.  Cardiovascular: Normal rate and regular rhythm.   Pulmonary/Chest: Effort normal. She has rhonchi in the right upper field and the left upper field.  Lymphadenopathy:    She has no cervical adenopathy.  Skin: Skin is warm and dry.          Assessment & Plan:  Acute Bronchitis:  Cough x 8 days, now productive with green sputum, fatigue, feeling worse. Exam with rhonchi to bilateral upper quads, maxillary sinus pressure, bulging to left TM without erythema. Will start Doxycycline 7  day course today. RX also for Hycodan PRN HS cough. Fluids, rest. Discussed to call us if no improvement in 3-4 days. Also discussed post bronchitic cough.

## 2016-02-07 ENCOUNTER — Ambulatory Visit (INDEPENDENT_AMBULATORY_CARE_PROVIDER_SITE_OTHER): Payer: BLUE CROSS/BLUE SHIELD | Admitting: Family Medicine

## 2016-02-07 ENCOUNTER — Encounter: Payer: Self-pay | Admitting: Family Medicine

## 2016-02-07 VITALS — BP 122/78 | HR 71 | Temp 98.5°F | Ht 69.0 in | Wt 262.0 lb

## 2016-02-07 DIAGNOSIS — J209 Acute bronchitis, unspecified: Secondary | ICD-10-CM | POA: Diagnosis not present

## 2016-02-07 MED ORDER — AZITHROMYCIN 250 MG PO TABS: ORAL_TABLET | ORAL | Status: DC

## 2016-02-07 NOTE — Progress Notes (Signed)
Subjective:    Patient ID: [name], female    DOB: 04-12-1966, 50 y.o.   MRN: [id number]  HPI Here with uri symptoms   She was in [place]  Had some leg swelling and cough /uri symptoms The resort doctor gave her B12   Now has head congestion -colored nasal discharge  Also raspy breathing  ? If wheezing  Prod phlegm - brown in color   Decided to quit smoking while she was on vacation - she has not have a cigarette in 5 days   Woke up sweaty last night - but no fever  Chills yesterday  No body aches   Took some mucinex DM yesterday  Has left over px cough med at home if she needs it   Patient Active Problem List   Diagnosis Date Noted  . Acute bronchitis 02/07/2016  . Viral meningitis 02/15/2013   Past Medical History  Diagnosis Date  . Concussion 2008  . Hx of migraines   . Viral meningitis    No past surgical history on file. Social History  Substance Use Topics  . Smoking status: Current Every Day Smoker -- 0.25 packs/day for 3 years    Types: Cigarettes    Last Attempt to Quit: 08/25/2013  . Smokeless tobacco: Never Used     Comment: no smoking in 5 days  . Alcohol Use: 0.6 oz/week    0 Standard drinks or equivalent, 1 Cans of beer per week     Comment: occ   No family history on file. Allergies  Allergen Reactions  . Vancomycin Rash    Red man reaction   Current Outpatient Prescriptions on File Prior to Visit  Medication Sig Dispense Refill  . vitamin B-12 (CYANOCOBALAMIN) 1000 MCG tablet Take 1,000 mcg by mouth daily.     No current facility-administered medications on file prior to visit.    Review of Systems  Constitutional: Positive for appetite change and fatigue. Negative for fever.  HENT: Positive for congestion, postnasal drip, rhinorrhea, sinus pressure, sneezing and sore throat. Negative for ear pain.   Eyes: Negative for pain and discharge.  Respiratory: Positive for cough. Negative for shortness of breath, wheezing and stridor.    Cardiovascular: Negative for chest pain.  Gastrointestinal: Negative for nausea, vomiting and diarrhea.  Genitourinary: Negative for urgency, frequency and hematuria.  Musculoskeletal: Negative for myalgias and arthralgias.  Skin: Negative for rash.  Neurological: Positive for headaches. Negative for dizziness, weakness and light-headedness.  Psychiatric/Behavioral: Negative for confusion and dysphoric mood.   Review of Systems         Objective:   Physical Exam  Constitutional: She appears well-developed and well-nourished. No distress.  obese and well appearing   HENT:  Head: Normocephalic and atraumatic.  Right Ear: External ear normal.  Left Ear: External ear normal.  Mouth/Throat: Oropharynx is clear and moist.  Nares are injected and congested  No sinus tenderness Clear rhinorrhea and post nasal drip   Eyes: Conjunctivae and EOM are normal. Pupils are equal, round, and reactive to light. Right eye exhibits no discharge. Left eye exhibits no discharge.  Neck: Normal range of motion. Neck supple.  Cardiovascular: Normal rate and normal heart sounds.   Pulmonary/Chest: Effort normal and breath sounds normal. No respiratory distress. She has no wheezes. She has no rales. She exhibits no tenderness.  Harsh bs Few rhonchi No rales   Musculoskeletal: She exhibits no edema.  Lymphadenopathy:    She has no cervical adenopathy.  Neurological:  She is alert.  Skin: Skin is warm and dry. No rash noted.  Psychiatric: She has a normal mood and affect.          Assessment & Plan:   Problem List Items Addressed This Visit      Respiratory   Acute bronchitis - Primary    Viral  Reassuring exam Pt has left over px cough med at home  Drink lots of fluids and rest  Try the cough medicine you have already  Zyrtec 10 mg otc may help runny nose and drip  Acetaminophen for pain or fever   Let us know if you think you need an inhaler Update if not starting to improve in a  week or if worsening   [place] with smoking cessation so far!

## 2016-02-07 NOTE — Assessment & Plan Note (Signed)
Viral  Reassuring exam Pt has left over px cough med at home  Drink lots of fluids and rest  Try the cough medicine you have already  Zyrtec 10 mg otc may help runny nose and drip  Acetaminophen for pain or fever   Let us know if you think you need an inhaler Update if not starting to improve in a week or if worsening   [place] with smoking cessation so far!

## 2016-02-07 NOTE — Progress Notes (Signed)
Pre visit review using our clinic review tool, if applicable. No additional management support is needed unless otherwise documented below in the visit note. 

## 2016-02-07 NOTE — Patient Instructions (Addendum)
Drink lots of fluids and rest  Try the cough medicine you have already  Zyrtec 10 mg otc may help runny nose and drip  Acetaminophen for pain or fever   Let us know if you think you need an inhaler Update if not starting to improve in a week or if worsening   [place] job with smoking cessation so far!

## 2016-03-28 ENCOUNTER — Encounter: Payer: Self-pay | Admitting: Nurse Practitioner

## 2016-03-28 ENCOUNTER — Telehealth: Payer: Self-pay | Admitting: Family Medicine

## 2016-03-28 ENCOUNTER — Ambulatory Visit (INDEPENDENT_AMBULATORY_CARE_PROVIDER_SITE_OTHER): Payer: BLUE CROSS/BLUE SHIELD | Admitting: Nurse Practitioner

## 2016-03-28 VITALS — BP 112/66 | HR 105 | Temp 99.8°F | Ht 69.0 in | Wt 267.0 lb

## 2016-03-28 DIAGNOSIS — R350 Frequency of micturition: Secondary | ICD-10-CM

## 2016-03-28 LAB — POCT URINALYSIS DIP (MANUAL ENTRY)
BILIRUBIN UA: NEGATIVE
GLUCOSE UA: NEGATIVE
Ketones, POC UA: NEGATIVE
LEUKOCYTES UA: NEGATIVE
NITRITE UA: NEGATIVE
PH UA: 5.5
Protein Ur, POC: NEGATIVE
RBC UA: NEGATIVE
Spec Grav, UA: >=1.03
UROBILINOGEN UA: 0.2

## 2016-03-28 MED ORDER — NITROFURANTOIN MONOHYD MACRO 100 MG PO CAPS: 100.0000 mg | ORAL_CAPSULE | Freq: Two times a day (BID) | ORAL | Status: DC

## 2016-03-28 NOTE — Assessment & Plan Note (Signed)
POCT was not as convincing for UTI as symptoms were. Nitrofurantoin sent to pharmacy for 5 days- send culture results via Mychart.  FU prn worsening/failure to improve.

## 2016-03-28 NOTE — Patient Instructions (Signed)

## 2016-03-28 NOTE — Telephone Encounter (Signed)
Needs f/u with first available -when you are able to reach her (or urgent care)

## 2016-03-28 NOTE — Telephone Encounter (Signed)
Unable to reach pt by phone.

## 2016-03-28 NOTE — Telephone Encounter (Signed)
Patient Name: Julie DankerLARK Kendrix DOB: 09/04/1966 Initial Comment Caller States chills, nausea, back pain, frequent urination Nurse Assessment Nurse: Elijah Birkaldwell, RN, Lynda Date/Time (Eastern Time): 03/28/2016 11:28:46 AM Confirm and document reason for call. If symptomatic, describe symptoms. You must click the next button to save text entered. ---Caller states chills, nausea, sacrum level back pain, frequent urination. Symptoms started yesterday. No fever. Has the patient traveled out of the country within the last 30 days? ---Not Applicable Does the patient have any new or worsening symptoms? ---Yes Will a triage be completed? ---Yes Related visit to physician within the last 2 weeks? ---No Does the PT have any chronic conditions? (i.e. diabetes, asthma, etc.) ---No Is the patient pregnant or possibly pregnant? (Ask all females between the ages of 1812-55) ---No Is this a behavioral health or substance abuse call? ---No Guidelines Guideline Title Affirmed Question Affirmed Notes Nausea Unexplained nausea (all triage questions negative) Final Disposition User See PCP When Office is Open (within 3 days) Elijah Birkaldwell, RN, Stark BrayLynda Comments Nurse upgraded outcome as she is experiencing other bothersome symptoms, the nausea & chills mostly today. The caller has no availability tomorrow or Thursday, and schedule booked today at office. Referrals REFERRED TO PCP OFFICE Disagree/Comply: Comply

## 2016-03-28 NOTE — Progress Notes (Signed)
Pre visit review using our clinic review tool, if applicable. No additional management support is needed unless otherwise documented below in the visit note. 

## 2016-03-28 NOTE — Progress Notes (Signed)
Patient ID: [name], female    DOB: [birth year]  Age: 50 y.o. MRN: [id number]  CC: Flank Pain and Urinary Frequency   HPI Julie HillockLark R Mcknight presents for CC of flank pain and urinary frequency x 1 day.   1) Tmax 99.8  Chills  Nausea  Low back pain- sees a chiropractor  Lower abdominal discomfort after sneezing once Slight malodor   Diarrhea this morning   History [name] has a past medical history of Concussion (2008); migraines; and Viral meningitis.   She has no past surgical history on file.   Her family history is not on file.She reports that she has been smoking Cigarettes.  She has a .75 pack-year smoking history. She has never used smokeless tobacco. She reports that she drinks about 0.6 oz of alcohol per week. She reports that she does not use illicit drugs.  Outpatient Prescriptions Prior to Visit  Medication Sig Dispense Refill  . vitamin B-12 (CYANOCOBALAMIN) 1000 MCG tablet Take 1,000 mcg by mouth daily.    Marland Kitchen. azithromycin (ZITHROMAX Z-PAK) 250 MG tablet Take 2 pills by mouth today and then 1 pill daily for 4 days 6 tablet 0   No facility-administered medications prior to visit.    ROS Review of Systems  Constitutional: Positive for fever and chills. Negative for diaphoresis and fatigue.  Gastrointestinal: Positive for nausea and diarrhea. Negative for vomiting and rectal pain.  Genitourinary: Positive for urgency and frequency. Negative for dysuria, hematuria, flank pain, decreased urine volume, difficulty urinating and pelvic pain.  Skin: Negative for rash.    Objective:  BP 112/66 mmHg  Pulse 105  Temp(Src) 99.8 F (37.7 C) (Oral)  Ht 5\' 9"  (1.753 m)  Wt 267 lb (121.11 kg)  BMI 39.41 kg/m2  SpO2 97%  Physical Exam  Constitutional: She is oriented to person, place, and time. She appears well-developed and well-nourished. No distress.  HENT:  Head: Normocephalic and atraumatic.  Right Ear: External ear normal.  Left Ear: External ear normal.   Cardiovascular:  Tachycardia  Abdominal: There is no CVA tenderness.  Neurological: She is alert and oriented to person, place, and time.  Skin: Skin is warm and dry. No rash noted. She is not diaphoretic.  Psychiatric: She has a normal mood and affect. Her behavior is normal. Judgment and thought content normal.   Assessment & Plan:   [name] was seen today for flank pain and urinary frequency.  Diagnoses and all orders for this visit:  Urinary frequency -     POCT urinalysis dipstick -     Urine culture  Other orders -     nitrofurantoin, macrocrystal-monohydrate, (MACROBID) 100 MG capsule; Take 1 capsule (100 mg total) by mouth 2 (two) times daily.   I have discontinued Ms. Curet's azithromycin. I am also having her start on nitrofurantoin (macrocrystal-monohydrate). Additionally, I am having her maintain her vitamin B-12.  Meds ordered this encounter  Medications  . nitrofurantoin, macrocrystal-monohydrate, (MACROBID) 100 MG capsule    Sig: Take 1 capsule (100 mg total) by mouth 2 (two) times daily.    Dispense:  10 capsule    Refill:  0    Order Specific Question:  Supervising Provider    Answer:  Sherlene ShamsULLO, TERESA L [[id number]]     Follow-up: Return if symptoms worsen or fail to improve.

## 2016-03-28 NOTE — Telephone Encounter (Signed)
Pt call back made appointment today with carrie doss in [place]

## 2016-03-29 DIAGNOSIS — R350 Frequency of micturition: Secondary | ICD-10-CM | POA: Diagnosis not present

## 2016-04-01 LAB — URINE CULTURE

## 2016-05-04 ENCOUNTER — Ambulatory Visit (INDEPENDENT_AMBULATORY_CARE_PROVIDER_SITE_OTHER): Payer: BLUE CROSS/BLUE SHIELD | Admitting: Family Medicine

## 2016-05-04 ENCOUNTER — Encounter: Payer: Self-pay | Admitting: Family Medicine

## 2016-05-04 VITALS — BP 156/84 | HR 74 | Temp 98.5°F | Wt 265.8 lb

## 2016-05-04 DIAGNOSIS — J0100 Acute maxillary sinusitis, unspecified: Secondary | ICD-10-CM | POA: Diagnosis not present

## 2016-05-04 MED ORDER — IBUPROFEN 200 MG PO TABS: 800.0000 mg | ORAL_TABLET | Freq: Three times a day (TID) | ORAL | Status: DC | PRN

## 2016-05-04 MED ORDER — AMOXICILLIN-POT CLAVULANATE 875-125 MG PO TABS: 1.0000 | ORAL_TABLET | Freq: Two times a day (BID) | ORAL | Status: DC

## 2016-05-04 NOTE — Patient Instructions (Signed)
Start augmentin.   Warm compresses, rest and fluids, neti put.  Ibuprofen with food, taper as tolerated.  Take care.  Glad to see you.

## 2016-05-04 NOTE — Assessment & Plan Note (Signed)
Nontoxic.  Augmentin, ibuprofen with cautions.  Rest and fluids, see AVS.  F/u prn.

## 2016-05-04 NOTE — Progress Notes (Signed)
Pre visit review using our clinic review tool, if applicable. No additional management support is needed unless otherwise documented below in the visit note.  I thanked her for her effort- she stopped smoking.    Facial pain.  Started yesterday.  Had been sneezing for a few days prior.  No FCNAVD.  Tried neti pot and peppermint oil.  Tried afrin.  She had some relief with OTC meds and the pain returned.  No ear pain.  R maxillary pain.  H/o migraines but this is differnent.  Photophobia today but she attributed that to pain today.  No aura today.  R upper tooth pain.  No ST. No cough.  Meds, vitals, and allergies reviewed.   ROS: Per HPI unless specifically indicated in ROS section   GEN: nad, alert and oriented HEENT: mucous membranes moist, tm w/o erythema, nasal exam w/o erythema, clear discharge noted,  OP with cobblestoning, R max sinus ttp NECK: supple w/o LA CV: rrr.   PULM: ctab, no inc wob EXT: no edema

## 2016-05-16 DIAGNOSIS — S0501XA Injury of conjunctiva and corneal abrasion without foreign body, right eye, initial encounter: Secondary | ICD-10-CM | POA: Diagnosis not present

## 2017-02-04 DIAGNOSIS — J00 Acute nasopharyngitis [common cold]: Secondary | ICD-10-CM | POA: Diagnosis not present

## 2017-02-04 DIAGNOSIS — R070 Pain in throat: Secondary | ICD-10-CM | POA: Diagnosis not present

## 2017-02-14 DIAGNOSIS — R110 Nausea: Secondary | ICD-10-CM | POA: Diagnosis not present

## 2017-02-14 DIAGNOSIS — R197 Diarrhea, unspecified: Secondary | ICD-10-CM | POA: Diagnosis not present

## 2017-02-14 DIAGNOSIS — J029 Acute pharyngitis, unspecified: Secondary | ICD-10-CM | POA: Diagnosis not present

## 2017-10-05 ENCOUNTER — Encounter: Payer: Self-pay | Admitting: Family Medicine

## 2017-10-05 ENCOUNTER — Ambulatory Visit: Payer: BLUE CROSS/BLUE SHIELD | Admitting: Family Medicine

## 2017-10-05 VITALS — BP 124/78 | HR 81 | Temp 98.4°F | Wt 270.0 lb

## 2017-10-05 DIAGNOSIS — R509 Fever, unspecified: Secondary | ICD-10-CM | POA: Diagnosis not present

## 2017-10-05 DIAGNOSIS — J029 Acute pharyngitis, unspecified: Secondary | ICD-10-CM | POA: Diagnosis not present

## 2017-10-05 DIAGNOSIS — B349 Viral infection, unspecified: Secondary | ICD-10-CM

## 2017-10-05 LAB — POC INFLUENZA A&B (BINAX/QUICKVUE)
INFLUENZA A, POC: NEGATIVE
INFLUENZA B, POC: NEGATIVE

## 2017-10-05 LAB — POCT RAPID STREP A (OFFICE): Rapid Strep A Screen: NEGATIVE

## 2017-10-05 NOTE — Progress Notes (Signed)
   Subjective:    Patient ID: [name], female    DOB: 07/09/1966, 51 y.o.   MRN: [id number]  HPI This is a 51 yo female who presents today with fever 102, sore throat since yesterday. Little cough, some headache and muscle aches. No wheeze or SOB. Drank a therapeutic tea from [place] with good relief of fever. 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   Past Medical History:  Diagnosis Date  . Concussion 2008  . Hx of migraines   . Viral meningitis    No past surgical history on file. No family history on file.    Review of Systems Per HPI    Objective:   Physical Exam  Constitutional: She is oriented to person, place, and time. She appears well-developed and well-nourished. No distress.  HENT:  Head: Normocephalic and atraumatic.  Nose: Nose normal.  Mouth/Throat: Oropharynx is clear and moist.  Eyes: Conjunctivae are normal.  Neck: Normal range of motion. Neck supple.  Cardiovascular: Normal rate, regular rhythm and normal heart sounds.  Pulmonary/Chest: Effort normal and breath sounds normal.  Lymphadenopathy:    She has no cervical adenopathy.  Neurological: She is alert and oriented to person, place, and time.  Skin: Skin is warm and dry. She is not diaphoretic.  Psychiatric: She has a normal mood and affect. Her behavior is normal. Judgment and thought content normal.  Vitals reviewed.     BP 124/78 (BP Location: Left Arm, Patient Position: Sitting, Cuff Size: Large)   Pulse 81   Temp 98.4 F (36.9 C) (Oral)   Wt 270 lb (122.5 kg)   SpO2 97%   BMI 39.87 kg/m  Wt Readings from Last 3 Encounters:  10/05/17 270 lb (122.5 kg)  05/04/16 265 lb 12 oz (120.5 kg)  03/28/16 267 lb (121.1 kg)       Assessment & Plan:  1. Sore throat - POCT rapid strep A- negativve  2. Fever, unspecified fever cause - POC Influenza A&B (Binax test)- negative  3. Viral illness -  Patient Instructions  Your flu test was negative  You  likely still have a viral illness-  For nasal congestion you can use Afrin nasal spray for 3 days max, Sudafed, saline nasal spray (generic is fine for all). For cough you can try Delsym. Drink enough fluids to make your urine light yellow. For fever/chill/muscle aches you can take over the counter acetaminophen or ibuprofen.  Please come back in if you are not better in 5-7 days or if you develop wheezing, shortness of breath or persistent vomiting.       Olean Reeeborah Yuma Pacella, FNP-BC  [facility], [facility]  10/05/2017 10:56 AM

## 2017-10-05 NOTE — Patient Instructions (Signed)
Your flu test was negative  You likely still have a viral illness-  For nasal congestion you can use Afrin nasal spray for 3 days max, Sudafed, saline nasal spray (generic is fine for all). For cough you can try Delsym. Drink enough fluids to make your urine light yellow. For fever/chill/muscle aches you can take over the counter acetaminophen or ibuprofen.  Please come back in if you are not better in 5-7 days or if you develop wheezing, shortness of breath or persistent vomiting.

## 2017-10-10 ENCOUNTER — Ambulatory Visit: Payer: Self-pay | Admitting: *Deleted

## 2017-10-10 ENCOUNTER — Telehealth: Payer: Self-pay | Admitting: Family Medicine

## 2017-10-10 NOTE — Telephone Encounter (Signed)
Attempted to call pt. To obtain more information about symptoms; left message to call the office back.

## 2017-10-10 NOTE — Telephone Encounter (Signed)
  Reason for Disposition . Cough  Answer Assessment - Initial Assessment Questions 1. ONSET: "When did the cough begin?"      2 days ago 2. SEVERITY: "How bad is the cough today?"      Dry annoying cough 3. RESPIRATORY DISTRESS: "Describe your breathing."      Slightly winded 4. FEVER: "Do you have a fever?" If so, ask: "What is your temperature, how was it measured, and when did it start?"     None at this time 5. SPUTUM: "Describe the color of your sputum" (clear, white, yellow, green)     Green and chuncky 6. HEMOPTYSIS: "Are you coughing up any blood?" If so ask: "How much?" (flecks, streaks, tablespoons, etc.)     no 7. CARDIAC HISTORY: "Do you have any history of heart disease?" (e.g., heart attack, congestive heart failure)     no 8. LUNG HISTORY: "Do you have any history of lung disease?"  (e.g., pulmonary embolus, asthma, emphysema)     no 9. PE RISK FACTORS: "Do you have a history of blood clots?" (or: recent major surgery, recent prolonged travel, bedridden )     no 10. OTHER SYMPTOMS: "Do you have any other symptoms?" (e.g., runny nose, wheezing, chest pain)       no 11. PREGNANCY: "Is there any chance you are pregnant?" "When was your last menstrual period?"     none 12. TRAVEL: "Have you traveled out of the country in the last month?" (e.g., travel history, exposures)       *No Answer*  Protocols used: COUGH - ACUTE PRODUCTIVE-A-AH

## 2017-10-10 NOTE — Telephone Encounter (Signed)
Copied from CRM #[id number]. Topic: General - Other >> Oct 10, 2017  9:08 AM Elliot GaultBell, Tiffany M wrote: Relation to ZO:XWRUpt:self  Call back number: [phone]  [id number] [place] [address] [phone] (Phone) [phone] (Fax)  Reason for call:  Patient was seen 10/05/17 for cold like symptoms and states symtomps have not improved, patient states she coughing green phlegm requesting Rx, patient states please leave detail message.

## 2018-02-26 ENCOUNTER — Telehealth: Payer: Self-pay | Admitting: Family Medicine

## 2018-02-26 ENCOUNTER — Ambulatory Visit (INDEPENDENT_AMBULATORY_CARE_PROVIDER_SITE_OTHER)
Admission: RE | Admit: 2018-02-26 | Discharge: 2018-02-26 | Disposition: A | Discharge status: Home / Self Care | Payer: BLUE CROSS/BLUE SHIELD | Source: Ambulatory Visit | Attending: Family Medicine | Admitting: Family Medicine

## 2018-02-26 ENCOUNTER — Ambulatory Visit: Payer: BLUE CROSS/BLUE SHIELD | Admitting: Family Medicine

## 2018-02-26 ENCOUNTER — Encounter: Payer: Self-pay | Admitting: Family Medicine

## 2018-02-26 VITALS — BP 110/66 | HR 73 | Temp 98.3°F | Ht 69.0 in | Wt 277.5 lb

## 2018-02-26 DIAGNOSIS — M79642 Pain in left hand: Secondary | ICD-10-CM | POA: Diagnosis not present

## 2018-02-26 DIAGNOSIS — M545 Low back pain, unspecified: Secondary | ICD-10-CM | POA: Insufficient documentation

## 2018-02-26 DIAGNOSIS — G8929 Other chronic pain: Secondary | ICD-10-CM

## 2018-02-26 DIAGNOSIS — M79645 Pain in left finger(s): Secondary | ICD-10-CM

## 2018-02-26 NOTE — Assessment & Plan Note (Signed)
4th finger pain w/ tenderness but no swelling Xray today

## 2018-02-26 NOTE — Progress Notes (Signed)
Subjective:    Patient ID: [name], female    DOB: [birth year], 52 y.o.   MRN: [id number]  HPI Here for back and finger stiffness  Wt Readings from Last 3 Encounters:  02/26/18 277 lb 8 oz (125.9 kg)  10/05/17 270 lb (122.5 kg)  05/04/16 265 lb 12 oz (120.5 kg)   40.98 kg/m   Pain in low back after sleeping It does wake her up at night  Works for a chiropractor- gets adjustments (does not help with this pain)  Stiff Once she gets up and moves it improves  It does wake her up at night No radiation to either leg No neuro symptoms  Tender around low back /sacrum to the touch   Has not had a recent xray   L ring finger is stiff and sore and tender to the touch  No swelling or redness   Glucosamine did not help   Mother has OA  Last gyn exam was several years ago  Periods are few and far between  Added progesterone cream and magnesium prn for sleep     Patient Active Problem List   Diagnosis Date Noted  . Low back pain 02/26/2018  . Finger pain, left 02/26/2018   Past Medical History:  Diagnosis Date  . Concussion 2008  . Hx of migraines   . Viral meningitis    History reviewed. No pertinent surgical history. Social History   Tobacco Use  . Smoking status: Former Smoker    Packs/day: 0.25    Years: 3.00    Pack years: 0.75    Types: Cigarettes    Last attempt to quit: 08/25/2013    Years since quitting: 4.5  . Smokeless tobacco: Never Used  . Tobacco comment: no smoking in 5 days  Substance Use Topics  . Alcohol use: Yes    Alcohol/week: 0.6 oz    Types: 1 Cans of beer per week    Comment: occ  . Drug use: No   History reviewed. No pertinent family history. Allergies  Allergen Reactions  . Vancomycin Rash    Red man reaction   Current Outpatient Medications on File Prior to Visit  Medication Sig Dispense Refill  . MAGNESIUM PO Take 1 tablet by mouth daily.    . Progesterone 40 % CREA Apply 1 application topically daily.    .  vitamin B-12 (CYANOCOBALAMIN) 1000 MCG tablet Take 1,000 mcg by mouth daily.     No current facility-administered medications on file prior to visit.      Review of Systems  Constitutional: Negative for activity change, appetite change, fatigue, fever and unexpected weight change.  HENT: Negative for congestion, ear pain, rhinorrhea, sinus pressure and sore throat.   Eyes: Negative for pain, redness and visual disturbance.  Respiratory: Negative for cough, shortness of breath and wheezing.   Cardiovascular: Negative for chest pain and palpitations.  Gastrointestinal: Negative for abdominal pain, blood in stool, constipation and diarrhea.  Endocrine: Negative for polydipsia and polyuria.  Genitourinary: Negative for dysuria, frequency and urgency.  Musculoskeletal: Positive for arthralgias and back pain. Negative for joint swelling and myalgias.  Skin: Negative for pallor and rash.  Allergic/Immunologic: Negative for environmental allergies.  Neurological: Negative for dizziness, syncope and headaches.  Hematological: Negative for adenopathy. Does not bruise/bleed easily.  Psychiatric/Behavioral: Negative for decreased concentration and dysphoric mood. The patient is not nervous/anxious.        Objective:   Physical Exam  Constitutional: She appears well-developed and well-nourished.  No distress.  obese and well appearing   HENT:  Head: Normocephalic and atraumatic.  Eyes: Pupils are equal, round, and reactive to light. Conjunctivae and EOM are normal. No scleral icterus.  Neck: Normal range of motion. Neck supple.  Cardiovascular: Normal rate and regular rhythm.  Pulmonary/Chest: Effort normal and breath sounds normal. She has no wheezes. She has no rales.  Abdominal: Soft. Bowel sounds are normal. She exhibits no distension. There is no tenderness.  Musculoskeletal: She exhibits tenderness.       Right shoulder: She exhibits tenderness and bony tenderness. She exhibits normal  range of motion, no crepitus, no deformity, normal pulse and normal strength.       Lumbar back: She exhibits decreased range of motion, tenderness and spasm. She exhibits no bony tenderness and no edema.       Left hand: She exhibits tenderness. She exhibits normal range of motion, normal two-point discrimination, normal capillary refill, no laceration and no swelling. Normal sensation noted. Normal strength noted.  L 4th finger-slt tenderness at MCP joint  No deformity/swelling or crepitus   LS- tender L3- S1  Also bilat lumbar musculature  Nl rom of hips Neg SLR Nl gait and rom   Lymphadenopathy:    She has no cervical adenopathy.  Neurological: She is alert. She has normal strength and normal reflexes. She displays no atrophy. No cranial nerve deficit or sensory deficit. She exhibits normal muscle tone. Coordination normal.  Negative SLR  Skin: Skin is warm and dry. No rash noted. No erythema. No pallor.  Psychiatric: She has a normal mood and affect.  pleasant          Assessment & Plan:   Problem List Items Addressed This Visit      Other   Finger pain, left    4th finger pain w/ tenderness but no swelling Xray today        Relevant Orders   DG Hand Complete Left   Low back pain - Primary    Chronic- pm and stiff after inactivity  xr today  Does see chiropractor for alignment  rec heat and stretching  Rehab handout given  Consider PT if nl xr  Wt loss enc as well as regular exercise       Relevant Orders   DG Lumbar Spine Complete

## 2018-02-26 NOTE — Telephone Encounter (Signed)
-----   Message from Shon MilletShapale M [place], [place] sent at 02/26/2018  4:51 PM EDT ----- Pt notified of xray results and Dr. Royden Purlower's comments, pt agrees with PT, please put order in and I advised pt [facility] will [facility] to schedule appt

## 2018-02-26 NOTE — Telephone Encounter (Signed)
Ref done  Will route to [facility] 

## 2018-02-26 NOTE — Patient Instructions (Signed)
Continue stretching  Heat on back as needed   Tylenol for pain as needed   xrays of hand and spine today   May consider PT

## 2018-02-26 NOTE — Assessment & Plan Note (Signed)
Chronic- pm and stiff after inactivity  xr today  Does see chiropractor for alignment  rec heat and stretching  Rehab handout given  Consider PT if nl xr  Wt loss enc as well as regular exercise

## 2018-02-27 NOTE — Telephone Encounter (Signed)
Working on this-Anastasiya V Hopkins, RMA  

## 2018-03-11 ENCOUNTER — Telehealth: Payer: Self-pay

## 2018-03-11 NOTE — Telephone Encounter (Signed)
Left message for patient to call Anastasiya back in regards to a referral-Anastasiya V Hopkins, RMA   

## 2018-03-29 DIAGNOSIS — H1011 Acute atopic conjunctivitis, right eye: Secondary | ICD-10-CM | POA: Diagnosis not present

## 2018-04-24 ENCOUNTER — Ambulatory Visit: Payer: BLUE CROSS/BLUE SHIELD | Admitting: Family Medicine

## 2018-04-24 ENCOUNTER — Encounter: Payer: Self-pay | Admitting: Family Medicine

## 2018-04-24 VITALS — BP 120/80 | HR 86 | Temp 98.6°F | Ht 69.0 in | Wt 276.5 lb

## 2018-04-24 DIAGNOSIS — R51 Headache: Secondary | ICD-10-CM | POA: Diagnosis not present

## 2018-04-24 DIAGNOSIS — R519 Headache, unspecified: Secondary | ICD-10-CM

## 2018-04-24 MED ORDER — KETOROLAC TROMETHAMINE 60 MG/2ML IM SOLN
60.0000 mg | Freq: Once | INTRAMUSCULAR | Status: AC
  Administered 2018-04-24: 60 mg via INTRAMUSCULAR

## 2018-04-24 MED ORDER — PROMETHAZINE HCL 25 MG PO TABS: 25.0000 mg | ORAL_TABLET | Freq: Four times a day (QID) | ORAL | 2 refills | Status: DC | PRN

## 2018-04-24 MED ORDER — BUTALBITAL-APAP-CAFFEINE 50-325-40 MG PO TABS: 1.0000 | ORAL_TABLET | Freq: Four times a day (QID) | ORAL | 1 refills | Status: DC | PRN

## 2018-04-24 NOTE — Progress Notes (Signed)
Dr. Karleen HampshireSpencer T. Aldine Grainger, MD, CAQ [facility] [address] [place], [id number] Phone: [phone] Fax: [phone]  04/24/2018  Patient: Julie HillockLark R Nuccio, MRN: [id number], DOB: 1965-12-28, 52 y.o.  Primary Physician:  Tower, Audrie GallusMarne A, MD   Chief Complaint  Patient presents with  . Headache   Subjective:   Julie HillockLark R Thurmon is a 52 y.o. very pleasant female patient who presents with the following:  Had a headache a couple of days ago and yesterday, took a zyrtec an excedrin migraine. Woke up this morning and took some excedrin migraine.   Forehead and on the R eye. H/o migraine, none in 20 years.   ? If photophobia or phonophobia. Able to drive here today.   Cheek felt a little numb on the R side earlier. Had this similar with prior migraines.   In 2008 she had a significant caution with about 18 months of postconcussive syndrome and postconcussive headache for about 12 months.  Past Medical History, Surgical History, Social History, Family History, Problem List, Medications, and Allergies have been reviewed and updated if relevant.  Patient Active Problem List   Diagnosis Date Noted  . Low back pain 02/26/2018  . Finger pain, left 02/26/2018    Past Medical History:  Diagnosis Date  . Concussion 2008  . Hx of migraines   . Viral meningitis     History reviewed. No pertinent surgical history.  Social History   Socioeconomic History  . Marital status: Married    Spouse name: Not on file  . Number of children: Not on file  . Years of education: Not on file  . Highest education level: Not on file  Occupational History  . Occupation: [employer] - in [employer]  Social Needs  . Financial resource strain: Not on file  . Food insecurity:    Worry: Not on file    Inability: Not on file  . Transportation needs:    Medical: Not on file    Non-medical: Not on file  Tobacco Use  . Smoking status: Former Smoker   Packs/day: 0.25    Years: 3.00    Pack years: 0.75    Types: Cigarettes    Last attempt to quit: 08/25/2013    Years since quitting: 4.6  . Smokeless tobacco: Never Used  . Tobacco comment: no smoking in 5 days  Substance and Sexual Activity  . Alcohol use: Yes    Alcohol/week: 0.6 oz    Types: 1 Cans of beer per week    Comment: occ  . Drug use: No  . Sexual activity: Not on file  Lifestyle  . Physical activity:    Days per week: Not on file    Minutes per session: Not on file  . Stress: Not on file  Relationships  . Social connections:    Talks on phone: Not on file    Gets together: Not on file    Attends religious service: Not on file    Active member of club or organization: Not on file    Attends meetings of clubs or organizations: Not on file    Relationship status: Not on file  . Intimate partner violence:    Fear of current or ex partner: Not on file    Emotionally abused: Not on file    Physically abused: Not on file    Forced sexual activity: Not on file  Other Topics Concern  . Not on file  Social  History Narrative  . Not on file    History reviewed. No pertinent family history.  Allergies  Allergen Reactions  . Vancomycin Rash    Red man reaction    Medication list reviewed and updated in full in [place] [facility].   GEN: No acute illnesses, no fevers, chills. GI: No n/v/d, eating normally Pulm: No SOB Interactive and getting along well at home.  Otherwise, ROS is as per the HPI.  Objective:   BP 120/80   Pulse 86   Temp 98.6 F (37 C) (Oral)   Ht 5\' 9"  (1.753 m)   Wt 276 lb 8 oz (125.4 kg)   BMI 40.83 kg/m   GEN: WDWN, NAD, Non-toxic, A & O x 3 HEENT: Atraumatic, Normocephalic. Neck supple. No masses, No LAD. Ears and Nose: No external deformity. CV: RRR, No M/G/R. No JVD. No thrill. No extra heart sounds. PULM: CTA B, no wheezes, crackles, rhonchi. No retractions. No resp. distress. No accessory muscle use. EXTR: No c/c/e NEURO  Normal gait.  PSYCH: Normally interactive. Conversant. Not depressed or anxious appearing.  Calm demeanor.   Neuro: CN 2-12 grossly intact. PERRLA. EOMI. Sensation intact throughout. Str 5/5 all extremities. DTR 2+. No clonus. A and o x 4. Romberg neg. Finger nose neg. Heel -shin neg.   Laboratory and Imaging Data:  Assessment and Plan:   Acute intractable headache, unspecified headache type - Plan: ketorolac (TORADOL) injection 60 mg  More likely migraine variant.  Questionable photophobia.  We will treat conservatively, give the patient some Toradol in the office as well as some Fioricet as well as Phenergan p.o.  Rest for 24 hours.  I reassured the patient that this should not be coming from her closed head injury from 11 years ago.  Follow-up: No follow-ups on file.  Meds ordered this encounter  Medications  . butalbital-acetaminophen-caffeine (FIORICET, ESGIC) 50-325-40 MG tablet    Sig: Take 1-2 tablets by mouth every 6 (six) hours as needed for headache. Max 6 per day    Dispense:  30 tablet    Refill:  1  . promethazine (PHENERGAN) 25 MG tablet    Sig: Take 1 tablet (25 mg total) by mouth every 6 (six) hours as needed for nausea or vomiting (headache).    Dispense:  30 tablet    Refill:  2  . ketorolac (TORADOL) injection 60 mg   Medications Discontinued During This Encounter  Medication Reason  . vitamin B-12 (CYANOCOBALAMIN) 1000 MCG tablet Completed Course  . MAGNESIUM PO Completed Course  . Progesterone 40 % CREA Completed Course   No orders of the defined types were placed in this encounter.   Signed,  Elpidio Galea. Jenilyn Magana, MD   Allergies as of 04/24/2018      Reactions   Vancomycin Rash   Red man reaction      Medication List        Accurate as of 04/24/18  1:50 PM. Always use your most recent med list.          butalbital-acetaminophen-caffeine 50-325-40 MG tablet Commonly known as:  FIORICET, ESGIC Take 1-2 tablets by mouth every 6 (six) hours as  needed for headache. Max 6 per day   promethazine 25 MG tablet Commonly known as:  PHENERGAN Take 1 tablet (25 mg total) by mouth every 6 (six) hours as needed for nausea or vomiting (headache).

## 2018-04-26 ENCOUNTER — Encounter: Payer: Self-pay | Admitting: Internal Medicine

## 2018-04-26 ENCOUNTER — Ambulatory Visit: Payer: Self-pay

## 2018-04-26 ENCOUNTER — Ambulatory Visit: Payer: BLUE CROSS/BLUE SHIELD | Admitting: Internal Medicine

## 2018-04-26 VITALS — BP 118/80 | HR 84 | Temp 98.4°F | Ht 69.0 in | Wt 275.0 lb

## 2018-04-26 DIAGNOSIS — R51 Headache: Secondary | ICD-10-CM | POA: Diagnosis not present

## 2018-04-26 DIAGNOSIS — G43809 Other migraine, not intractable, without status migrainosus: Secondary | ICD-10-CM

## 2018-04-26 DIAGNOSIS — R519 Headache, unspecified: Secondary | ICD-10-CM

## 2018-04-26 MED ORDER — PREDNISONE 10 MG PO TABS: ORAL_TABLET | ORAL | 0 refills | Status: DC

## 2018-04-26 MED ORDER — SUMATRIPTAN SUCCINATE 25 MG PO TABS: 25.0000 mg | ORAL_TABLET | ORAL | 0 refills | Status: DC | PRN

## 2018-04-26 NOTE — Telephone Encounter (Signed)
Pt has appt with R Baity NP 04/26/18 at 2:30

## 2018-04-26 NOTE — Patient Instructions (Signed)
Trigeminal Neuralgia Trigeminal neuralgia is a nerve disorder that causes attacks of severe facial pain. The attacks last from a few seconds to several minutes. They can happen for days, weeks, or months and then go away for months or years. Trigeminal neuralgia is also called tic douloureux. What are the causes? This condition is caused by damage to a nerve in the face that is called the trigeminal nerve. An attack can be triggered by:  Talking.  Chewing.  Putting on makeup.  Washing your face.  Shaving your face.  Brushing your teeth.  Touching your face.  What increases the risk? This condition is more likely to develop in:  Women.  People who are 50 years of age or older.  What are the signs or symptoms? The main symptom of this condition is pain in the jaw, lips, eyes, nose, scalp, forehead, and face. The pain may be intense, stabbing, electric, or shock-like. How is this diagnosed? This condition is diagnosed with a physical exam. A CT scan or MRI may be done to rule out other conditions that can cause facial pain. How is this treated? This condition may be treated with:  Avoiding the things that trigger your attacks.  Pain medicine.  Surgery. This may be done in severe cases if other medical treatment does not provide relief.  Follow these instructions at home:  Take over-the-counter and prescription medicines only as told by your health care provider.  If you wish to get pregnant, talk with your health care provider before you start trying to get pregnant.  Avoid the things that trigger your attacks. It may help to: ? Chew on the unaffected side of your mouth. ? Avoid touching your face. ? Avoid blasts of hot or cold air. Contact a health care provider if:  Your pain medicine is not helping.  You develop new, unexplained symptoms, such as: ? Double vision. ? Facial weakness. ? Changes in hearing or balance.  You become pregnant. Get help right away  if:  Your pain is unbearable, and your pain medicine does not help. This information is not intended to replace advice given to you by your health care provider. Make sure you discuss any questions you have with your health care provider. Document Released: 11/03/2000 Document Revised: 07/09/2016 Document Reviewed: 03/01/2015 Elsevier Interactive Patient Education  2018 Elsevier Inc.  

## 2018-04-26 NOTE — Progress Notes (Signed)
Subjective:    Patient ID: [name], female    DOB: [birth year], 52 y.o.   MRN: [id number]  HPI  Pt presents to the clinic today with c.o right side headache. She reports this started 4-5 days ago. The headache is located in front of her right ear. She describes the pain as sharp, throbbing. The pain radiates into her right cheekbone and right lower jaw. She denies dizziness or visual changes. She was seen 6/5 for the same, diagnosed with a migraine. She was given Toradol 60 mg with minimal relief. She was also given a RX for Fioricet which she reports only provided minimal relief. She denies any neck or shoulder pain. She has had migraines in the past, but reports this feels different from her typical migraines.   Review of Systems      Past Medical History:  Diagnosis Date  . Concussion 2008  . Hx of migraines   . Viral meningitis     Current Outpatient Medications  Medication Sig Dispense Refill  . butalbital-acetaminophen-caffeine (FIORICET, ESGIC) 50-325-40 MG tablet Take 1-2 tablets by mouth every 6 (six) hours as needed for headache. Max 6 per day 30 tablet 1  . promethazine (PHENERGAN) 25 MG tablet Take 1 tablet (25 mg total) by mouth every 6 (six) hours as needed for nausea or vomiting (headache). 30 tablet 2   No current facility-administered medications for this visit.     Allergies  Allergen Reactions  . Vancomycin Rash    Red man reaction    History reviewed. No pertinent family history.  Social History   Socioeconomic History  . Marital status: Married    Spouse name: Not on file  . Number of children: Not on file  . Years of education: Not on file  . Highest education level: Not on file  Occupational History  . Occupation: [employer] - in [employer]  Social Needs  . Financial resource strain: Not on file  . Food insecurity:    Worry: Not on file    Inability: Not on file  . Transportation needs:    Medical: Not on file   Non-medical: Not on file  Tobacco Use  . Smoking status: Former Smoker    Packs/day: 0.25    Years: 3.00    Pack years: 0.75    Types: Cigarettes    Last attempt to quit: 08/25/2013    Years since quitting: 4.6  . Smokeless tobacco: Never Used  . Tobacco comment: no smoking in 5 days  Substance and Sexual Activity  . Alcohol use: Yes    Alcohol/week: 0.6 oz    Types: 1 Cans of beer per week    Comment: occ  . Drug use: No  . Sexual activity: Not on file  Lifestyle  . Physical activity:    Days per week: Not on file    Minutes per session: Not on file  . Stress: Not on file  Relationships  . Social connections:    Talks on phone: Not on file    Gets together: Not on file    Attends religious service: Not on file    Active member of club or organization: Not on file    Attends meetings of clubs or organizations: Not on file    Relationship status: Not on file  . Intimate partner violence:    Fear of current or ex partner: Not on file    Emotionally abused: Not on file    Physically abused: Not on  file    Forced sexual activity: Not on file  Other Topics Concern  . Not on file  Social History Narrative  . Not on file     Constitutional: Pt reports headache. Denies fever, malaise, fatigue, or abrupt weight changes.  HEENT: Denies eye pain, eye redness, ear pain, ringing in the ears, wax buildup, runny nose, nasal congestion, bloody nose, or sore throat. Respiratory: Denies difficulty breathing, shortness of breath, cough or sputum production.   Cardiovascular: Denies chest pain, chest tightness, palpitations or swelling in the hands or feet.  Skin: Denies redness, rashes, lesions or ulcercations.  Neurological: Denies dizziness, difficulty with memory, difficulty with speech or problems with balance and coordination.    No other specific complaints in a complete review of systems (except as listed in HPI above).  Objective:   Physical Exam  BP 118/80 (BP Location:  Left Arm, Patient Position: Sitting, Cuff Size: Large)   Pulse 84   Temp 98.4 F (36.9 C) (Oral)   Ht 5\' 9"  (1.753 m)   Wt 275 lb (124.7 kg)   BMI 40.61 kg/m  Wt Readings from Last 3 Encounters:  04/26/18 275 lb (124.7 kg)  04/24/18 276 lb 8 oz (125.4 kg)  02/26/18 277 lb 8 oz (125.9 kg)    General: Appears her stated age, well developed, well nourished in NAD. Skin: Warm, dry and intact. No rashes noted. HEENT: Head: normal shape and size; Eyes: sclera white, no icterus, conjunctiva pink, PERRLA and EOMs intact; Ears: Tm's gray and intact, normal light reflex; Throat/Mouth: Teeth present, mucosa pink and moist, no exudate, lesions or ulcerations noted.  Musculoskeletal: Normal flexion, extension and rotation of the cervical spine.  Neurological: Alert and oriented. Coordination normal.    BMET    Component Value Date/Time   NA 138 12/18/2013 1830   K 4.7 12/18/2013 1830   CL 105 12/18/2013 1830   CO2 30 12/18/2013 1830   GLUCOSE 97 12/18/2013 1830   BUN 19 (H) 12/18/2013 1830   CREATININE 1.10 12/18/2013 1830   CALCIUM 9.6 12/18/2013 1830   GFRNONAA >60 12/18/2013 1830   GFRAA >60 12/18/2013 1830    Lipid Panel  No results found for: CHOL, TRIG, HDL, CHOLHDL, VLDL, LDLCALC  CBC    Component Value Date/Time   WBC 10.7 12/18/2013 1830   WBC 8.6 05/02/2013 1557   RBC 4.98 12/18/2013 1830   RBC 5.04 05/02/2013 1557   HGB 13.7 12/18/2013 1830   HCT 42.6 12/18/2013 1830   PLT 256 12/18/2013 1830   MCV 86 12/18/2013 1830   MCH 27.4 12/18/2013 1830   MCH 27.6 05/02/2013 1557   MCHC 32.1 12/18/2013 1830   MCHC 33.9 05/02/2013 1557   RDW 14.0 12/18/2013 1830   LYMPHSABS 4.0 (H) 12/18/2013 1830   MONOABS 0.6 12/18/2013 1830   EOSABS 0.4 12/18/2013 1830   BASOSABS 0.1 12/18/2013 1830    Hgb A1C No results found for: HGBA1C          Assessment & Plan:   Acute Migraine, Right Facial Pain:  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

## 2018-04-26 NOTE — Telephone Encounter (Signed)
Pt c/o severe headache that pt describes as "throbbing" pain. The headache starts along the right cheekbone to the temple then down 1 inch below ear lobe. Pt was seen in office 04/24/18 for acute intractable headache and was given a shot of Toradol and an Rx for Fiorcet and phenergan. Pt states she has a h/o migraines several years ago but was relieved by Excedrin migraine.  Pt took 1 Fiorcet about 0800 this am then 1 more at 1000 without relief.  Pt has tried ice, massage,rest but is not getting relief. Care advice given and appointment given for today at 2:30. Advised pt take her Fiorcet when it is due. Pt advised to try and go home from work and rest in a dark room. Pt advised to have someone drive her to her appt today. Pt verbalized understanding.  Reason for Disposition . [1] SEVERE headache (e.g., excruciating) AND [2] not improved after 2 hours of pain medicine  Answer Assessment - Initial Assessment Questions 1. LOCATION: "Where does it hurt?"      Right side of face along the cheekbone goes down 1 inch down from bottom of ear 2. ONSET: "When did the headache start?" (Minutes, hours or days)      2 days 3. PATTERN: "Does the pain come and go, or has it been constant since it started?"     Comes and goes 4. SEVERITY: "How bad is the pain?" and "What does it keep you from doing?"  (e.g., Scale 1-10; mild, moderate, or severe)   - MILD (1-3): doesn't interfere with normal activities    - MODERATE (4-7): interferes with normal activities or awakens from sleep    - SEVERE (8-10): excruciating pain, unable to do any normal activities        severe 5. RECURRENT SYMPTOM: "Have you ever had headaches before?" If so, ask: "When was the last time?" and "What happened that time?"      Yes-headache after head injury 11 years ago then migraine that was helped with Excedrin  Migraine 6. CAUSE: "What do you think is causing the headache?"     Pt does not know  7. MIGRAINE: "Have you been diagnosed with  migraine headaches?" If so, ask: "Is this headache similar?"    Yes-similar just in the severity 8. HEAD INJURY: "Has there been any recent injury to the head?"      no 9. OTHER SYMPTOMS: "Do you have any other symptoms?" (fever, stiff neck, eye pain, sore throat, cold symptoms)     Nasal congestion 10. PREGNANCY: "Is there any chance you are pregnant?" "When was your last menstrual period?"       N/a off and on perimenopausal  Protocols used: HEADACHE-A-AH

## 2018-05-20 DIAGNOSIS — M797 Fibromyalgia: Secondary | ICD-10-CM | POA: Insufficient documentation

## 2018-07-01 ENCOUNTER — Telehealth: Payer: Self-pay | Admitting: *Deleted

## 2018-07-01 NOTE — Telephone Encounter (Signed)
Copied from CRM [phone]. Topic: General - Other >> Jul 01, 2018 12:47 PM Mcneil, Ja-Kwan wrote: Reason for CRM: Pt states she is having pain in her left foot, hands, and knuckles. Pt also stated that she had been bitten by a sleeping bug about a month ago. Pt did not want to schedule an appt. Pt requests call back from a nurse. Cb# [phone]

## 2018-07-01 NOTE — Telephone Encounter (Signed)
Concern for allergic reaction? Not sure what a sleeping bug is. Any tick bite? Fever?  I recommend f/u if able

## 2018-07-01 NOTE — Telephone Encounter (Signed)
I will see her then  

## 2018-07-01 NOTE — Telephone Encounter (Signed)
Pt said that she said the bug's name wrong it's the kissing bug. Pt said she was bitten over a month ago and just had a red little bump for a while but recently she started having bad body aches and joint pain and googled that there is a rare chance that she could have been infected with a parasite. Pt doesn't have any fever but she does want blood work to rule that out. Pt advise she should come in for an OV to discuss this directly with Dr. Milinda Antisower so pt scheduled appt for tomorrow at 11:15, FYI to Dr. Milinda Antisower

## 2018-07-02 ENCOUNTER — Encounter: Payer: Self-pay | Admitting: Family Medicine

## 2018-07-02 ENCOUNTER — Ambulatory Visit: Payer: BLUE CROSS/BLUE SHIELD | Admitting: Family Medicine

## 2018-07-02 ENCOUNTER — Ambulatory Visit (INDEPENDENT_AMBULATORY_CARE_PROVIDER_SITE_OTHER)
Admission: RE | Admit: 2018-07-02 | Discharge: 2018-07-02 | Disposition: A | Discharge status: Home / Self Care | Payer: BLUE CROSS/BLUE SHIELD | Source: Ambulatory Visit | Attending: Family Medicine | Admitting: Family Medicine

## 2018-07-02 VITALS — BP 128/82 | HR 76 | Temp 97.8°F | Ht 69.0 in | Wt 282.5 lb

## 2018-07-02 DIAGNOSIS — M7989 Other specified soft tissue disorders: Secondary | ICD-10-CM | POA: Diagnosis not present

## 2018-07-02 DIAGNOSIS — M79671 Pain in right foot: Secondary | ICD-10-CM | POA: Diagnosis not present

## 2018-07-02 DIAGNOSIS — M2550 Pain in unspecified joint: Secondary | ICD-10-CM | POA: Insufficient documentation

## 2018-07-02 DIAGNOSIS — M79672 Pain in left foot: Secondary | ICD-10-CM

## 2018-07-02 LAB — CBC WITH DIFFERENTIAL/PLATELET
BASOS ABS: 0.1 10*3/uL (ref 0.0–0.1)
Basophils Relative: 0.7 % (ref 0.0–3.0)
EOS PCT: 7.1 % — AB (ref 0.0–5.0)
Eosinophils Absolute: 0.5 10*3/uL (ref 0.0–0.7)
HEMATOCRIT: 40.9 % (ref 36.0–46.0)
Hemoglobin: 13.7 g/dL (ref 12.0–15.0)
LYMPHS ABS: 2.5 10*3/uL (ref 0.7–4.0)
LYMPHS PCT: 33.9 % (ref 12.0–46.0)
MCHC: 33.4 g/dL (ref 30.0–36.0)
MCV: 83.9 fl (ref 78.0–100.0)
Monocytes Absolute: 0.5 10*3/uL (ref 0.1–1.0)
Monocytes Relative: 6.2 % (ref 3.0–12.0)
NEUTROS ABS: 3.8 10*3/uL (ref 1.4–7.7)
NEUTROS PCT: 52.1 % (ref 43.0–77.0)
Platelets: 284 10*3/uL (ref 150.0–400.0)
RBC: 4.88 Mil/uL (ref 3.87–5.11)
RDW: 14.2 % (ref 11.5–15.5)
WBC: 7.4 10*3/uL (ref 4.0–10.5)

## 2018-07-02 LAB — SEDIMENTATION RATE: SED RATE: 30 mm/h (ref 0–30)

## 2018-07-02 NOTE — Progress Notes (Signed)
Subjective:    Patient ID: [name], female    DOB: [birth year], 52 y.o.   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)  Has not been to endemic area    Wt Readings from Last 3 Encounters:  07/02/18 282 lb 8 oz (128.1 kg)  04/26/18 275 lb (124.7 kg)  04/24/18 276 lb 8 oz (125.4 kg)   41.72 kg/m    Having pain in feet and hands   Was in [place] at 4th of July  Had swelling the whole time she was there (making it hard to hike or kayak)  Improved after coming home  Still a little swollen   Pain on top of her foot -more on L than R  Not her plantar fasciitis   Now hands hurt Some swelling- R hand/ mcp joint Very stiff   fam hx of RA - grandmother  Mother has OA   Also bitten by bug on cheek at the beach in [place] Blood on face  Thought she had the feces on her face  Did not see the bug   No tick bite or rash  No fever  occ headache  No ST  No muscle pain-just joints   Patient Active Problem List   Diagnosis Date Noted  . Multiple joint pain 07/02/2018  . Foot pain, bilateral 07/02/2018  . Low back pain 02/26/2018  . Finger pain, left 02/26/2018   Past Medical History:  Diagnosis Date  . Concussion 2008  . Hx of migraines   . Viral meningitis    History reviewed. No pertinent surgical history. Social History   Tobacco Use  . Smoking status: Former Smoker    Packs/day: 0.25    Years: 3.00    Pack years: 0.75    Types: Cigarettes    Last attempt to quit: 08/25/2013    Years since quitting: 4.8  . Smokeless tobacco: Never Used  . Tobacco comment: no smoking in 5 days  Substance Use Topics  . Alcohol use: Yes    Alcohol/week: 1.0 standard drinks    Types: 1 Cans of beer per week    Comment: occ  . Drug use: No   History reviewed. No pertinent family history. Allergies  Allergen Reactions  . Vancomycin Rash    Red man reaction   No current outpatient medications on file prior  to visit.   No current facility-administered medications on file prior to visit.       Review of Systems  Constitutional: Negative for activity change, appetite change, fatigue, fever and unexpected weight change.  HENT: Negative for congestion, ear pain, rhinorrhea, sinus pressure and sore throat.   Eyes: Negative for pain, redness and visual disturbance.  Respiratory: Negative for cough, shortness of breath and wheezing.   Cardiovascular: Negative for chest pain and palpitations.  Gastrointestinal: Negative for abdominal pain, blood in stool, constipation and diarrhea.  Endocrine: Negative for polydipsia and polyuria.  Genitourinary: Negative for dysuria, frequency and urgency.  Musculoskeletal: Positive for arthralgias and joint swelling. Negative for back pain and myalgias.       Pain and stiffness in feet and hands with some intermittent swelling  Skin: Negative for color change, pallor, rash and wound.  Allergic/Immunologic: Negative for environmental allergies.  Neurological: Negative for dizziness, syncope, light-headedness, numbness and headaches.  Hematological: Negative for adenopathy. Does not bruise/bleed easily.  Psychiatric/Behavioral: Negative for decreased concentration and dysphoric mood. The patient is not  nervous/anxious.        Objective:   Physical Exam  Constitutional: She is oriented to person, place, and time. She appears well-developed and well-nourished. No distress.  obese and well appearing   HENT:  Head: Normocephalic and atraumatic.  Mouth/Throat: Oropharynx is clear and moist.  Eyes: Pupils are equal, round, and reactive to light. Conjunctivae and EOM are normal. No scleral icterus.  Neck: Normal range of motion. Neck supple. No thyromegaly present.  Cardiovascular: Normal rate, regular rhythm and normal heart sounds.  Pulmonary/Chest: Effort normal and breath sounds normal. No respiratory distress. She has no wheezes.  Abdominal: Soft. Bowel sounds  are normal. She exhibits no distension. There is no tenderness.  Musculoskeletal: She exhibits tenderness. She exhibits no edema or deformity.  No hand swelling or tenderness today   R foot- tender at distal 3,4th MTP area  slt puffiness /no pitting edema  Bunion deformity noted   L foot- tender over proximal 5th MTP  No edema  Bunion deformity noted   Pain rep by plantar flexion in both feet but overall nl rom    Lymphadenopathy:    She has no cervical adenopathy.  Neurological: She is alert and oriented to person, place, and time. She displays normal reflexes. No cranial nerve deficit. She exhibits normal muscle tone. Coordination normal.  Skin: Skin is warm and dry. No rash noted. No erythema.  No insect bites or rashes noted    Psychiatric: She has a normal mood and affect.          Assessment & Plan:   Problem List Items Addressed This Visit      Other   Foot pain, bilateral    With intermittent swelling-worse with traveling and also worse with hiking  Xray bilat feet today-tender in different areas (dorsal)  Bunion def noted  Fam hx of arthritis  Want to also r/o stress fx      Relevant Orders   DG Foot Complete Right   DG Foot Complete Left   Multiple joint pain - Primary    Hands and feet  Intermittent swelling after travel No known tick bites  Not in endemic area for kissing bug or malaria   Reassuring exam today Xray bilat feet  Lab for auto immune joint dz as well as lyme ab  Cbc as well  Plan to follow results       Relevant Orders   B. Burgdorfi Antibodies   ANA   Rheumatoid factor   Sedimentation Rate   CBC with Differential/Platelet

## 2018-07-02 NOTE — Assessment & Plan Note (Addendum)
With intermittent swelling-worse with traveling and also worse with hiking  Xray bilat feet today-tender in different areas (dorsal)  Bunion def noted  Fam hx of arthritis  Want to also r/o stress fx

## 2018-07-02 NOTE — Assessment & Plan Note (Signed)
Hands and feet  Intermittent swelling after travel No known tick bites  Not in endemic area for kissing bug or malaria   Reassuring exam today Xray bilat feet  Lab for auto immune joint dz as well as lyme ab  Cbc as well  Plan to follow results

## 2018-07-02 NOTE — Patient Instructions (Signed)
Tests today to screen for auto immune joint disease and lyme disease   Also xray of feet- to look for arthritis or fracture   Drink lots of water  Avoid excessive sodium (processed foods)   Alert me if symptoms change or if you develop rash or fever

## 2018-07-04 LAB — ANA: ANA: NEGATIVE

## 2018-07-04 LAB — B. BURGDORFI ANTIBODIES

## 2018-07-04 LAB — TEST AUTHORIZATION

## 2018-07-04 LAB — RHEUMATOID FACTOR: Rhuematoid fact SerPl-aCnc: <14 IU/mL (ref <14)

## 2018-07-04 LAB — URIC ACID: Uric Acid, Serum: 7.1 mg/dL — ABNORMAL HIGH (ref 2.5–7.0)

## 2018-07-05 ENCOUNTER — Other Ambulatory Visit: Payer: Self-pay | Admitting: Family Medicine

## 2018-07-05 ENCOUNTER — Encounter: Payer: Self-pay | Admitting: Family Medicine

## 2018-07-05 MED ORDER — COLCHICINE 0.6 MG PO TABS: 0.6000 mg | ORAL_TABLET | Freq: Two times a day (BID) | ORAL | 0 refills | Status: DC | PRN

## 2018-07-05 MED ORDER — INDOMETHACIN 50 MG PO CAPS: 50.0000 mg | ORAL_CAPSULE | Freq: Two times a day (BID) | ORAL | 1 refills | Status: DC | PRN

## 2018-07-05 NOTE — Telephone Encounter (Signed)
Spoke to pt and confirmed pharmacy; rx sent

## 2018-07-05 NOTE — Telephone Encounter (Signed)
I will send her in some colchicine-try it twice daily for pain with food (it can upset stomach)  I pended it -please ask which pharmacy and send it  Thanks   Tylenol is also ok as directed along with it

## 2018-07-05 NOTE — Telephone Encounter (Signed)
-----   Message from Desmond DikeWaynetta H Knight, [place] sent at 07/05/2018  8:13 AM EDT ----- Spoke to pt and informed her of results and instruction; f/u appt scheduled. Pt states she is still having "really bad pain" and is wanting to know what she can do for relief until her upcoming appt

## 2018-07-05 NOTE — Telephone Encounter (Signed)
Indomethacin

## 2018-07-09 ENCOUNTER — Ambulatory Visit: Payer: BLUE CROSS/BLUE SHIELD | Admitting: Family Medicine

## 2018-07-09 ENCOUNTER — Encounter: Payer: Self-pay | Admitting: Family Medicine

## 2018-07-09 VITALS — BP 126/70 | HR 68 | Temp 97.8°F | Ht 69.0 in | Wt 284.5 lb

## 2018-07-09 DIAGNOSIS — M8580 Other specified disorders of bone density and structure, unspecified site: Secondary | ICD-10-CM | POA: Diagnosis not present

## 2018-07-09 DIAGNOSIS — M79672 Pain in left foot: Secondary | ICD-10-CM | POA: Diagnosis not present

## 2018-07-09 DIAGNOSIS — M79671 Pain in right foot: Secondary | ICD-10-CM | POA: Diagnosis not present

## 2018-07-09 NOTE — Patient Instructions (Signed)
Don't use the indocin if it does not help  Use heat /cold compresses if they help   Make sure you wear good supportive shoes   We will refer you to rheumatology   Keep hydrated

## 2018-07-09 NOTE — Progress Notes (Signed)
Subjective:    Patient ID: [name], female    DOB: [birth year], 52 y.o.   MRN: [id number]  HPI Here for f/u of joint pain   Seen 8/13 for joint pain mainly in hands and feet  Top of L foot was the worst  Thought the had some swelling in R hand   Labs were done  Results for orders placed or performed in visit on 07/02/18  B. Burgdorfi Antibodies  Result Value Ref Range   B burgdorferi Ab IgG+IgM <0.90 index  ANA  Result Value Ref Range   Anti Nuclear Antibody(ANA) NEGATIVE NEGATIVE  Rheumatoid factor  Result Value Ref Range   Rhuematoid fact SerPl-aCnc <14 <14 IU/mL  Sedimentation Rate  Result Value Ref Range   Sed Rate 30 0 - 30 mm/hr  CBC with Differential/Platelet  Result Value Ref Range   WBC 7.4 4.0 - 10.5 K/uL   RBC 4.88 3.87 - 5.11 Mil/uL   Hemoglobin 13.7 12.0 - 15.0 g/dL   HCT 40.9 81.1 - 91.4 %   MCV 83.9 78.0 - 100.0 fl   MCHC 33.4 30.0 - 36.0 g/dL   RDW 78.2 95.6 - 21.3 %   Platelets 284.0 150.0 - 400.0 K/uL   Neutrophils Relative % 52.1 43.0 - 77.0 %   Lymphocytes Relative 33.9 12.0 - 46.0 %   Monocytes Relative 6.2 3.0 - 12.0 %   Eosinophils Relative 7.1 (H) 0.0 - 5.0 %   Basophils Relative 0.7 0.0 - 3.0 %   Neutro Abs 3.8 1.4 - 7.7 K/uL   Lymphs Abs 2.5 0.7 - 4.0 K/uL   Monocytes Absolute 0.5 0.1 - 1.0 K/uL   Eosinophils Absolute 0.5 0.0 - 0.7 K/uL   Basophils Absolute 0.1 0.0 - 0.1 K/uL  Uric acid  Result Value Ref Range   Uric Acid, Serum 7.1 (H) 2.5 - 7.0 mg/dL  TEST AUTHORIZATION  Result Value Ref Range   TEST NAME: URIC ACID    TEST CODE: 905XLL3    CLIENT CONTACT: KERI WALSH    REPORT ALWAYS MESSAGE SIGNATURE      Neg RF and ANA Uric acid mildly high   XRay  Dg Foot Complete Left  Result Date: 07/03/2018 CLINICAL DATA:  Third and fourth metatarsal region pain. EXAM: LEFT FOOT - COMPLETE 3+ VIEW COMPARISON:  None. FINDINGS: Similar to the right side, there are periarticular erosions at the metatarsal head region of the great  toe suggestive of gout. Mild adjacent soft tissue prominence. Mild hallux valgus deformity of the MTP joint of the great toe. No other bone finding. Mild nonspecific soft tissue swelling of the dorsal tissues of the forefoot. IMPRESSION: Periarticular erosions of the metatarsal head of the great toe consistent with gout. Mild hallux valgus deformity. Mild nonspecific dorsal soft tissue swelling of the forefoot. Electronically Signed   By: Paulina Fusi M.D.   On: 07/03/2018 08:27   Dg Foot Complete Right  Result Date: 07/03/2018 CLINICAL DATA:  Third and fourth metatarsal pain. EXAM: RIGHT FOOT COMPLETE - 3+ VIEW COMPARISON:  None. FINDINGS: Periarticular erosions of the distal aspect of the metatarsal of the great toe are suggestive of gout. Mild adjacent soft tissue prominence. Mild hallux valgus deformity of the MTP joint of the great toe. Mild nonspecific dorsal soft tissue swelling of the forefoot. No sign of fracture or stress fracture. IMPRESSION: Erosions of the metatarsal head of the great toe suggestive of gout. Mild hallux valgus deformity of the MTP joint of the  great toe. Dorsal soft tissue swelling of the forefoot, nonspecific. Electronically Signed   By: Paulina FusiMark  Shogry M.D.   On: 07/03/2018 08:26    Erosive changes at metatarsal head of great toe in both feet  Px indomethacin for symptoms because colchicine was not covered by her insurance  She said it did not help pain and "knocked her out" with sleepiness   Wt Readings from Last 3 Encounters:  07/09/18 284 lb 8 oz (129 kg)  07/02/18 282 lb 8 oz (128.1 kg)  04/26/18 275 lb (124.7 kg)   42.01 kg/m   No diuretic medicines   Diagnostics are consistent with gout  Symptoms are not   No family hx of gout  She is a good water drinker- at least 64 oz per day  She was taking fish oil and tumeric   Still has the same pain  Notes her skin is really dry as well   Patient Active Problem List   Diagnosis Date Noted  . Erosion of bone  07/09/2018  . Multiple joint pain 07/02/2018  . Foot pain, bilateral 07/02/2018  . Low back pain 02/26/2018   Past Medical History:  Diagnosis Date  . Concussion 2008  . Hx of migraines   . Viral meningitis    History reviewed. No pertinent surgical history. Social History   Tobacco Use  . Smoking status: Former Smoker    Packs/day: 0.25    Years: 3.00    Pack years: 0.75    Types: Cigarettes    Last attempt to quit: 08/25/2013    Years since quitting: 4.8  . Smokeless tobacco: Never Used  . Tobacco comment: no smoking in 5 days  Substance Use Topics  . Alcohol use: Yes    Alcohol/week: 1.0 standard drinks    Types: 1 Cans of beer per week    Comment: occ  . Drug use: No   History reviewed. No pertinent family history. Allergies  Allergen Reactions  . Vancomycin Rash    Red man reaction   Current Outpatient Medications on File Prior to Visit  Medication Sig Dispense Refill  . indomethacin (INDOCIN) 50 MG capsule Take 1 capsule (50 mg total) by mouth 2 (two) times daily as needed. With a meal 30 capsule 1   No current facility-administered medications on file prior to visit.     Review of Systems  Constitutional: Positive for fatigue. Negative for activity change, appetite change, fever and unexpected weight change.  HENT: Negative for congestion, ear pain, rhinorrhea, sinus pressure and sore throat.   Eyes: Negative for pain, redness and visual disturbance.  Respiratory: Negative for cough, shortness of breath and wheezing.   Cardiovascular: Negative for chest pain and palpitations.  Gastrointestinal: Negative for abdominal pain, blood in stool, constipation and diarrhea.  Endocrine: Negative for polydipsia and polyuria.  Genitourinary: Negative for dysuria, frequency and urgency.  Musculoskeletal: Positive for arthralgias and joint swelling. Negative for back pain, gait problem and myalgias.  Skin: Negative for pallor and rash.  Allergic/Immunologic: Negative for  environmental allergies.  Neurological: Negative for dizziness, syncope and headaches.  Hematological: Negative for adenopathy. Does not bruise/bleed easily.  Psychiatric/Behavioral: Negative for decreased concentration and dysphoric mood. The patient is not nervous/anxious.        Objective:   Physical Exam  Constitutional: She is oriented to person, place, and time. She appears well-developed and well-nourished. No distress.  obese and well appearing   HENT:  Head: Normocephalic and atraumatic.  Mouth/Throat: Oropharynx is clear  and moist.  Eyes: Pupils are equal, round, and reactive to light. Conjunctivae and EOM are normal. Right eye exhibits no discharge. Left eye exhibits no discharge. No scleral icterus.  Neck: Normal range of motion. Neck supple.  Cardiovascular: Normal rate, regular rhythm and normal heart sounds.  Pulmonary/Chest: Effort normal and breath sounds normal. No respiratory distress. She has no wheezes. She has no rales.  No crackles  Abdominal: Soft. Bowel sounds are normal. There is no tenderness.  Musculoskeletal: She exhibits tenderness and deformity. She exhibits no edema.  Medial bunion deformities in both feet  Tenderness of first MTP joints w/o redness or swelling  Some tenderness over soft tissue on top of feet   Tender MCP joints in both hands w/o swelling Nl rom of fingers and wrists   Lymphadenopathy:    She has no cervical adenopathy.  Neurological: She is alert and oriented to person, place, and time. She displays normal reflexes. No cranial nerve deficit. Coordination normal.  Skin: Skin is warm and dry. No rash noted. No erythema.  Psychiatric: She has a normal mood and affect.          Assessment & Plan:   Problem List Items Addressed This Visit      Musculoskeletal and Integument   Erosion of bone - Primary    Seen in first MTP joints of both feet (great toes) as if in gout flare Symptoms are not classic for gout however - no  sensitivity or severe tenderness/ redness/ warmth or swelling  ? If other process could cause this  Neg RF and ANA No trauma hx  No imp with indocin or other nsaid Will ref to rheumatology for further eval       Relevant Orders   Ambulatory referral to Rheumatology     Other   Foot pain, bilateral    With joint erosion  Not typical symptoms of gout however Will refer to rheumatology

## 2018-07-10 NOTE — Assessment & Plan Note (Signed)
Seen in first MTP joints of both feet (great toes) as if in gout flare Symptoms are not classic for gout however - no sensitivity or severe tenderness/ redness/ warmth or swelling  ? If other process could cause this  Neg RF and ANA No trauma hx  No imp with indocin or other nsaid Will ref to rheumatology for further eval

## 2018-07-10 NOTE — Assessment & Plan Note (Signed)
With joint erosion  Not typical symptoms of gout however Will refer to rheumatology

## 2018-07-17 ENCOUNTER — Telehealth: Payer: Self-pay

## 2018-07-17 DIAGNOSIS — M79641 Pain in right hand: Secondary | ICD-10-CM | POA: Diagnosis not present

## 2018-07-17 DIAGNOSIS — M064 Inflammatory polyarthropathy: Secondary | ICD-10-CM | POA: Diagnosis not present

## 2018-07-17 DIAGNOSIS — M79642 Pain in left hand: Secondary | ICD-10-CM | POA: Diagnosis not present

## 2018-07-17 DIAGNOSIS — M545 Low back pain: Secondary | ICD-10-CM | POA: Diagnosis not present

## 2018-07-17 DIAGNOSIS — M549 Dorsalgia, unspecified: Secondary | ICD-10-CM | POA: Diagnosis not present

## 2018-07-17 DIAGNOSIS — M1712 Unilateral primary osteoarthritis, left knee: Secondary | ICD-10-CM | POA: Diagnosis not present

## 2018-07-17 DIAGNOSIS — M1990 Unspecified osteoarthritis, unspecified site: Secondary | ICD-10-CM | POA: Diagnosis not present

## 2018-07-17 DIAGNOSIS — M1711 Unilateral primary osteoarthritis, right knee: Secondary | ICD-10-CM | POA: Diagnosis not present

## 2018-07-17 DIAGNOSIS — M25569 Pain in unspecified knee: Secondary | ICD-10-CM | POA: Diagnosis not present

## 2018-07-17 DIAGNOSIS — M069 Rheumatoid arthritis, unspecified: Secondary | ICD-10-CM | POA: Diagnosis not present

## 2018-07-17 NOTE — Telephone Encounter (Signed)
Done, patient notified

## 2018-07-17 NOTE — Telephone Encounter (Signed)
Copied from CRM #[id number]. Topic: [id number] - Request >> Jul 17, 2018  3:19 PM Waymon AmatoBurton, Donna F wrote:  Pt is calling stating that the rheumatologist  that we referred her to is requesting a copy of feet x-rays -physical copy   Best number [phone]

## 2018-08-01 DIAGNOSIS — M1990 Unspecified osteoarthritis, unspecified site: Secondary | ICD-10-CM | POA: Diagnosis not present

## 2018-08-01 DIAGNOSIS — M064 Inflammatory polyarthropathy: Secondary | ICD-10-CM | POA: Diagnosis not present

## 2018-08-01 DIAGNOSIS — M549 Dorsalgia, unspecified: Secondary | ICD-10-CM | POA: Diagnosis not present

## 2018-08-01 DIAGNOSIS — M069 Rheumatoid arthritis, unspecified: Secondary | ICD-10-CM | POA: Diagnosis not present

## 2018-08-08 ENCOUNTER — Other Ambulatory Visit: Payer: Self-pay | Admitting: Rheumatology

## 2018-08-08 DIAGNOSIS — M461 Sacroiliitis, not elsewhere classified: Secondary | ICD-10-CM

## 2018-08-08 DIAGNOSIS — M549 Dorsalgia, unspecified: Secondary | ICD-10-CM

## 2018-08-08 DIAGNOSIS — Z1589 Genetic susceptibility to other disease: Secondary | ICD-10-CM

## 2018-08-08 DIAGNOSIS — M1990 Unspecified osteoarthritis, unspecified site: Secondary | ICD-10-CM

## 2018-08-08 DIAGNOSIS — M1380 Other specified arthritis, unspecified site: Secondary | ICD-10-CM

## 2018-08-17 ENCOUNTER — Ambulatory Visit
Admission: RE | Admit: 2018-08-17 | Discharge: 2018-08-17 | Disposition: A | Discharge status: Home / Self Care | Payer: BLUE CROSS/BLUE SHIELD | Source: Ambulatory Visit | Attending: Rheumatology | Admitting: Rheumatology

## 2018-08-17 DIAGNOSIS — M533 Sacrococcygeal disorders, not elsewhere classified: Secondary | ICD-10-CM | POA: Diagnosis not present

## 2018-08-17 DIAGNOSIS — M549 Dorsalgia, unspecified: Secondary | ICD-10-CM

## 2018-08-17 DIAGNOSIS — Z1589 Genetic susceptibility to other disease: Secondary | ICD-10-CM

## 2018-08-17 DIAGNOSIS — M461 Sacroiliitis, not elsewhere classified: Secondary | ICD-10-CM

## 2018-08-17 DIAGNOSIS — M1990 Unspecified osteoarthritis, unspecified site: Secondary | ICD-10-CM

## 2018-08-17 MED ORDER — GADOBENATE DIMEGLUMINE 529 MG/ML IV SOLN
20.0000 mL | Freq: Once | INTRAVENOUS | Status: AC | PRN
  Administered 2018-08-17: 20 mL via INTRAVENOUS

## 2018-10-28 DIAGNOSIS — M1990 Unspecified osteoarthritis, unspecified site: Secondary | ICD-10-CM | POA: Diagnosis not present

## 2018-10-28 DIAGNOSIS — M064 Inflammatory polyarthropathy: Secondary | ICD-10-CM | POA: Diagnosis not present

## 2018-10-28 DIAGNOSIS — M549 Dorsalgia, unspecified: Secondary | ICD-10-CM | POA: Diagnosis not present

## 2018-10-28 DIAGNOSIS — M069 Rheumatoid arthritis, unspecified: Secondary | ICD-10-CM | POA: Diagnosis not present

## 2018-11-21 DIAGNOSIS — J209 Acute bronchitis, unspecified: Secondary | ICD-10-CM | POA: Diagnosis not present

## 2018-11-21 DIAGNOSIS — R05 Cough: Secondary | ICD-10-CM | POA: Diagnosis not present

## 2019-01-31 ENCOUNTER — Ambulatory Visit: Payer: BLUE CROSS/BLUE SHIELD | Admitting: Family Medicine

## 2019-01-31 ENCOUNTER — Encounter: Payer: Self-pay | Admitting: Family Medicine

## 2019-01-31 ENCOUNTER — Other Ambulatory Visit: Payer: Self-pay

## 2019-01-31 VITALS — BP 116/74 | HR 74 | Temp 98.8°F | Ht 69.0 in | Wt 284.1 lb

## 2019-01-31 DIAGNOSIS — J111 Influenza due to unidentified influenza virus with other respiratory manifestations: Secondary | ICD-10-CM | POA: Diagnosis not present

## 2019-01-31 MED ORDER — GUAIFENESIN-CODEINE 100-10 MG/5ML PO SYRP: 5.0000 mL | ORAL_SOLUTION | Freq: Three times a day (TID) | ORAL | 0 refills | Status: DC | PRN

## 2019-01-31 MED ORDER — ALBUTEROL SULFATE HFA 108 (90 BASE) MCG/ACT IN AERS: 2.0000 | INHALATION_SPRAY | RESPIRATORY_TRACT | 0 refills | Status: DC | PRN

## 2019-01-31 MED ORDER — PREDNISONE 10 MG PO TABS: ORAL_TABLET | ORAL | 0 refills | Status: DC

## 2019-01-31 NOTE — Patient Instructions (Signed)
Finish your tamiflu Drink fluids   Tessalon for cough Also the codeine cough syrup  Delsym is ok also   Get rest when you can   Prednisone as directed  Albuterol inhaler as needed - wheeze or shortness of breath-see handout  Update if not starting to improve in a week or if worsening

## 2019-01-31 NOTE — Progress Notes (Signed)
Subjective:    Patient ID: [name], female    DOB: 04-18-1966, 53 y.o.   MRN: [id number]  HPI  Here with cough/congestion and fever with recent diagnosis of influenza on 3/10   Yesterday afternoon - temp went up (101)  Severe cough- cannot sleep  Not a lot of phlegm  Taking tessalon pearles  Otc: plain mucinex  For fever - took combo pill with tylenol   Taking tamiflu   Drinking tea and trying to sleep sitting up   Wheezing ?  (sounds like a creaky door when she exhales)    Today  Temp 98.8 Pulse ox 95%  Patient Active Problem List   Diagnosis Date Noted  . Influenza 01/31/2019  . 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 Multiple joint pain 07/02/2018  . Foot pain, bilateral 07/02/2018  . Low back pain 02/26/2018   Past Medical History:  Diagnosis Date  . Concussion 2008  . Hx of migraines   . Viral meningitis    No past surgical history on file. Social History   Tobacco Use  . Smoking status: Former Smoker    Packs/day: 0.25    Years: 3.00    Pack years: 0.75    Types: Cigarettes    Last attempt to quit: 08/25/2013    Years since quitting: 5.4  . Smokeless tobacco: Never Used  . Tobacco comment: no smoking in 5 days  Substance Use Topics  . Alcohol use: Yes    Alcohol/week: 1.0 standard drinks    Types: 1 Cans of beer per week    Comment: occ  . Drug use: No   No family history on file. Allergies  Allergen Reactions  . Vancomycin Rash    Red man reaction   Current Outpatient Medications on File Prior to Visit  Medication Sig Dispense Refill  . benzonatate (TESSALON) 200 MG capsule Take 200 mg by mouth 3 (three) times daily as needed for cough.    Marland Kitchen oseltamivir (TAMIFLU) 75 MG capsule Take 1 capsule by mouth daily.     No current facility-administered medications on file prior to visit.     Review of Systems  Constitutional: Positive for appetite change, fatigue and fever.  HENT: Positive for congestion, postnasal drip, rhinorrhea, sinus  pressure, sneezing and sore throat. Negative for ear pain.   Eyes: Negative for pain and discharge.  Respiratory: Positive for cough and wheezing. Negative for shortness of breath and stridor.   Cardiovascular: Negative for chest pain.  Gastrointestinal: Negative for diarrhea, nausea and vomiting.  Genitourinary: Negative for frequency, hematuria and urgency.  Musculoskeletal: Negative for arthralgias and myalgias.  Skin: Negative for rash.  Neurological: Positive for headaches. Negative for dizziness, weakness and light-headedness.  Psychiatric/Behavioral: Negative for confusion and dysphoric mood.       Objective:   Physical Exam Constitutional:      General: She is not in acute distress.    Appearance: Normal appearance. She is well-developed. She is not ill-appearing, toxic-appearing or diaphoretic.  HENT:     Head: Normocephalic and atraumatic.     Comments: Nares are injected and congested      Right Ear: Tympanic membrane, ear canal and external ear normal.     Left Ear: Tympanic membrane, ear canal and external ear normal.     Nose: Congestion and rhinorrhea present.     Mouth/Throat:     Mouth: Mucous membranes are moist.     Pharynx: Oropharynx is clear. No oropharyngeal exudate or posterior oropharyngeal  erythema.     Comments: Clear pnd  Eyes:     General:        Right eye: No discharge.        Left eye: No discharge.     Conjunctiva/sclera: Conjunctivae normal.     Pupils: Pupils are equal, round, and reactive to light.  Neck:     Musculoskeletal: Normal range of motion and neck supple.  Cardiovascular:     Rate and Rhythm: Normal rate.     Heart sounds: Normal heart sounds.  Pulmonary:     Effort: Pulmonary effort is normal. No respiratory distress.     Breath sounds: Normal breath sounds. No stridor. No wheezing, rhonchi or rales.     Comments: Good air exch Wheeze on forced exp No rales/rhonchi   Chest:     Chest wall: No tenderness.  Lymphadenopathy:      Cervical: No cervical adenopathy.  Skin:    General: Skin is warm and dry.     Capillary Refill: Capillary refill takes less than 2 seconds.     Findings: No rash.  Neurological:     Mental Status: She is alert.     Cranial Nerves: No cranial nerve deficit.  Psychiatric:        Mood and Affect: Mood normal.           Assessment & Plan:   Problem List Items Addressed This Visit      Respiratory   Influenza - Primary    Some gradual improvement -but cough and wheeze is still worst symptom Prednisone taper px- (disc side eff)  Albuterol mdi guaif -codeine with caution Disc symptomatic care - see instructions on AVS AVS: Finish your tamiflu Drink fluids   Tessalon for cough Also the codeine cough syrup  Delsym is ok also   Get rest when you can   Prednisone as directed  Albuterol inhaler as needed - wheeze or shortness of breath-see handout  Update if not starting to improve in a week or if worsening       Relevant Medications   oseltamivir (TAMIFLU) 75 MG capsule

## 2019-02-02 NOTE — Assessment & Plan Note (Signed)
Some gradual improvement -but cough and wheeze is still worst symptom Prednisone taper px- (disc side eff)  Albuterol mdi guaif -codeine with caution Disc symptomatic care - see instructions on AVS AVS: Finish your tamiflu Drink fluids   Tessalon for cough Also the codeine cough syrup  Delsym is ok also   Get rest when you can   Prednisone as directed  Albuterol inhaler as needed - wheeze or shortness of breath-see handout  Update if not starting to improve in a week or if worsening

## 2019-02-03 ENCOUNTER — Telehealth: Payer: Self-pay

## 2019-02-03 DIAGNOSIS — R05 Cough: Secondary | ICD-10-CM

## 2019-02-03 DIAGNOSIS — R059 Cough, unspecified: Secondary | ICD-10-CM

## 2019-02-03 MED ORDER — AZITHROMYCIN 250 MG PO TABS: ORAL_TABLET | ORAL | 0 refills | Status: DC

## 2019-02-03 NOTE — Telephone Encounter (Signed)
Pt said she was diagnosed over the phone with the flu and did not have flu test. Pt was seen on 01/31/19.pt has not had fever since 01/31/19.pt cough is better but pt is still coughing productively with yellow phlegm. Inhaler does help the cough and pt is still taking prednisone. No SOB unless talking and then has some SOB. Pt has not traveled and no + covid contact to pts knowledge. 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 Pt wants to know if she could be tested for flu and covid. Pt request cb.

## 2019-02-03 NOTE — Telephone Encounter (Signed)
Patient advised.

## 2019-02-03 NOTE — Telephone Encounter (Signed)
Since she has not had a fever since 3/13 -the flu is on it's way down  (testing would not change management)  Also without travel we do not test for covid at this time , and again since fever is gone that would be very unlikely   If she is coughing a lot- she may need to stay out of work another several days given nature of her job   In light of productive ongoing cough with yellow phlegm, I pended a px for zpak to send to her pref pharmacy  Please send if she is agreeable   Thanks

## 2019-02-03 NOTE — Telephone Encounter (Signed)
I think the drive through testing [place] will be at [address].  I am waiting for word on what type of order they need so please let her know I will try to find out in the am what I will need to do to order - so please send this back to me   Thanks

## 2019-02-03 NOTE — Telephone Encounter (Signed)
Pt notified of Dr. Royden Purl comments. Rx sent to pharmacy.   Pt did want me to ask if she could do the drive through Covid 19 testing. Pt said just due to the nature of her job it would make her feel a lot better if she was tested, because pt said her spouse has been around her and his job is wanting him to be quarantined if she is positive

## 2019-02-04 DIAGNOSIS — R059 Cough, unspecified: Secondary | ICD-10-CM | POA: Insufficient documentation

## 2019-02-04 DIAGNOSIS — R05 Cough: Secondary | ICD-10-CM | POA: Diagnosis not present

## 2019-02-04 NOTE — Telephone Encounter (Signed)
Pt notified of Dr. Royden Purl comments and address given to pt so she can go get tested

## 2019-02-04 NOTE — Telephone Encounter (Signed)
Per Denny Peon we are to put in a [facility] order and she can just go   I put the order in  She can double check on line - but I think it is [address] My understanding is that they are open   Keep me posted

## 2019-02-06 ENCOUNTER — Encounter: Payer: Self-pay | Admitting: Family Medicine

## 2019-02-06 ENCOUNTER — Telehealth: Payer: Self-pay

## 2019-02-06 NOTE — Telephone Encounter (Signed)
Pt said the last 15' pt having dull consistent upper rt chest pain; pain level now is 3. Last night and this morning when coughs up sputum has one bright red streak of blood in phlegm; chest is a little tight and tender to touch. No fever,no N&V, no sweats, no radiation of pain. Movement and coughing makes no difference with pain, prednisone and inhaler is helping with wheezing. For 5 ' this morning pt had lightheadedness. I spoke with Dr Milinda Antis and she said with the chest being tender to touch and coughing up one streak of bright red blood that could be from coughing so much and pulling a muscle. Dr Milinda Antis advised she has 2 choices to monitor at home to see if gets better or go to ED due to being on quarantine. If pt condition changes or worsens or pt CP worsens,SOB, pain radiates to arm,neck or shoulder,N&V or sudden sweats pt should call 911 and let them know pt is on quarantine; has had covid testing but not report back. Whichever ED pt will be going to needs to be called with same info. Pt voiced understanding. Pt said she will monitor and see how she does and if needed will call 911 and go to ED.pt said she was going to rest because while she was waiting on me to talk with Dr Milinda Antis she started getting dressed and felt slightly SOB. The SOB is better now. FYI to Dr Milinda Antis.

## 2019-02-09 ENCOUNTER — Telehealth: Payer: BLUE CROSS/BLUE SHIELD | Admitting: Family

## 2019-02-09 DIAGNOSIS — R0602 Shortness of breath: Secondary | ICD-10-CM

## 2019-02-09 DIAGNOSIS — R05 Cough: Secondary | ICD-10-CM

## 2019-02-09 DIAGNOSIS — R059 Cough, unspecified: Secondary | ICD-10-CM

## 2019-02-09 DIAGNOSIS — R0789 Other chest pain: Secondary | ICD-10-CM

## 2019-02-09 NOTE — Progress Notes (Signed)
Based on what you shared with me, I feel your condition warrants further evaluation and I recommend that you be seen for a face to face office visit.  You need to be seen face to face to rule out pneumonia given your symptoms and the duration of your symptoms.   Approximately 5 minutes was spent documenting and reviewing patient's chart.     NOTE: If you entered your credit card information for this eVisit, you will not be charged. You may see a "hold" on your card for the $35 but that hold will drop off and you will not have a charge processed.  If you are having a true medical emergency please call 911.  If you need an urgent face to face visit, [facility] has four urgent care centers for your convenience.    PLEASE NOTE: THE [facility] LOCATIONS AND URGENT CARE CLINICS DO NOT HAVE THE TESTING FOR CORONAVIRUS COVID19 AVAILABLE.  IF YOU FEEL YOU NEED THIS TEST YOU MUST HAVE AN ORDER TO GO TO A TESTING LOCATION FROM YOUR PROVIDER OR FROM A SCREENING E-VISIT     [email or web address] to reserve your spot online an avoid wait times  [facility] [address] [place], [place] [address] 8 am to 8 pm Monday-Friday 10 am to 4 pm Saturday-Sunday *Across the street from [employer]  [address] [place], [address] 8 am to 5 pm Monday-Friday * In the [facility] on the [facility]   The following sites will take your insurance:  . [facility]  [phone] Get Driving Directions Find a Provider at this Location  [address] [place], [place] [address] . 10 am to 8 pm Monday-Friday . 12 pm to 8 pm Saturday-Sunday   . [facility] at [facility]  [phone] Get Driving Directions Find a Provider at this Location  [address] [place], [place] [address] . 8 am to 8 pm Monday-Friday . 9 am to 6 pm Saturday . 11 am to 6 pm Sunday   . [facility]  [facility]gent Care at [facility]  [phone] Get Driving Directions  [address] [place], [place] [address] . 8 am to 8 pm Monday-Friday . 8 am to 4 pm Saturday-Sunday   Your e-visit answers were reviewed by a board certified advanced clinical practitioner to complete your personal care plan.  Thank you for using e-Visits.

## 2019-02-10 ENCOUNTER — Telehealth: Payer: Self-pay

## 2019-02-10 DIAGNOSIS — R509 Fever, unspecified: Secondary | ICD-10-CM | POA: Diagnosis not present

## 2019-02-10 DIAGNOSIS — R05 Cough: Secondary | ICD-10-CM | POA: Diagnosis not present

## 2019-02-10 NOTE — Telephone Encounter (Signed)
Pt said has been on quarantine for 14 days and got negative covid test on 02/09/19. Pt continues with deep prod cough with yellow phlegm, temp last night 99.7 and now 98.8 but pt feels hot, pt has not taken tylenol. Pt has been dizzy on and off this morning,if talks has SOB,pressure in chest for 3 days on and off; pressure started on rt side but now is all the way across chest. No CP. Pt wants to get chest xray; I spoke with Annice Pih at [facility] and pt can come there to be eval and if needed can get CXR there. Pt voiced understanding and will go to [facility].

## 2019-02-10 NOTE — Telephone Encounter (Signed)
Aware, thanks- recommend that she go there

## 2019-02-11 DIAGNOSIS — S92501A Displaced unspecified fracture of right lesser toe(s), initial encounter for closed fracture: Secondary | ICD-10-CM | POA: Diagnosis not present

## 2019-02-19 LAB — NOVEL CORONAVIRUS, NAA: SARS-CoV-2, NAA: NOT DETECTED

## 2019-03-31 ENCOUNTER — Encounter: Payer: Self-pay | Admitting: Family Medicine

## 2019-03-31 ENCOUNTER — Telehealth: Payer: Self-pay

## 2019-03-31 ENCOUNTER — Ambulatory Visit (INDEPENDENT_AMBULATORY_CARE_PROVIDER_SITE_OTHER): Payer: BLUE CROSS/BLUE SHIELD | Admitting: Family Medicine

## 2019-03-31 DIAGNOSIS — R509 Fever, unspecified: Secondary | ICD-10-CM | POA: Diagnosis not present

## 2019-03-31 NOTE — Telephone Encounter (Signed)
Visit done today

## 2019-03-31 NOTE — Assessment & Plan Note (Signed)
Fever with mild ST/ body aches and headache  No nasal symptoms or cough at this time  She was sick with flu like illness in march-tested neg for covid  This improved before new symptoms started this weekend She is not in distress Had considerable improvement in symptoms with naproxen otc (today) Enc fluids/rest/analgesics Watch for cough or other new symptoms  Would consider covid testing again if it becomes available Update if not starting to improve in a week or if worsening

## 2019-03-31 NOTE — Progress Notes (Signed)
Virtual Visit via Video Note  I connected with [name] on 03/31/19 at  4:15 PM EDT by a video enabled telemedicine application and verified that I am speaking with the correct person using two identifiers.  Location: Patient: home Provider: office    I discussed the limitations of evaluation and management by telemedicine and the availability of in person appointments. The patient expressed understanding and agreed to proceed.  History of Present Illness: Pt presents with respiratory symptoms   Had a bad uri in march (flu like)  Started getting better until this weekend   For 2 days has had headache and chills and fever - 99-101 degrees ST -not severe/ just persists  (does not feel like strep) Body aches-esp up and down back  Headache  Taking naproxen for this  Stayed in bed all day yesterday   Remote hx of viral meningitis-this does not feel similar  Able to bend neck  A little runny nose /not more than allergies  No sinus pain  Ears hurt a little- this is now better   Chest soreness - pressure/tight sensation  No cough /sob/diarrhea  No n/v   Lost smell/taste back in march - unsure if totally returned   She was tested in march and covid was negative (02/04/19) No covid exposures  No travel    In march had zpak/ prednisone/albuterol/ tussionex  Also cx[facility] at [facility] was normal  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 Negative for diaphoresis and weight loss.  HENT: Positive for sore throat. Negative for congestion, ear discharge and sinus pain.   Eyes: Negative for blurred vision, discharge and redness.  Respiratory: Negative for cough, sputum production, shortness of breath and wheezing.   Cardiovascular: Negative for palpitations and leg swelling.  Gastrointestinal: Negative for diarrhea, heartburn, nausea and vomiting.  Genitourinary: Negative for  dysuria.  Musculoskeletal: Positive for myalgias.  Skin: Negative for itching and rash.  Neurological: Positive for headaches. Negative for dizziness and tingling.    Patient Active Problem List   Diagnosis Date Noted  . Fever 03/31/2019  . Cough 02/04/2019  . Influenza 01/31/2019  . 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 Multiple joint pain 07/02/2018  . Foot pain, bilateral 07/02/2018  . Low back pain 02/26/2018   Past Medical History:  Diagnosis Date  . Concussion 2008  . Hx of migraines   . Viral meningitis    History reviewed. No pertinent surgical history. Social History   Tobacco Use  . Smoking status: Former Smoker    Packs/day: 0.25    Years: 3.00    Pack years: 0.75    Types: Cigarettes    Last attempt to quit: 08/25/2013    Years since quitting: 5.6  . Smokeless tobacco: Never Used  . Tobacco comment: no smoking in 5 days  Substance Use Topics  . Alcohol use: Yes    Alcohol/week: 1.0 standard drinks    Types: 1 Cans of beer per week    Comment: occ  . Drug use: No   History reviewed. No pertinent family history. Allergies  Allergen Reactions  . Vancomycin Rash    Red man reaction   Current Outpatient Medications on File Prior to Visit  Medication Sig Dispense Refill  . albuterol (PROVENTIL HFA;VENTOLIN HFA) 108 (90 Base) MCG/ACT inhaler Inhale 2 puffs into the lungs every 4 (four) hours as needed for wheezing or shortness  of breath. 1 Inhaler 0  . azithromycin (ZITHROMAX Z-PAK) 250 MG tablet Take 2 pills by mouth today and then 1 pill daily for 4 days 6 tablet 0  . benzonatate (TESSALON) 200 MG capsule Take 200 mg by mouth 3 (three) times daily as needed for cough.    Marland Kitchen. guaiFENesin-codeine (ROBITUSSIN AC) 100-10 MG/5ML syrup Take 5 mLs by mouth 3 (three) times daily as needed for cough. When not working or driving 161100 mL 0  . oseltamivir (TAMIFLU) 75 MG capsule Take 1 capsule by mouth daily.    . predniSONE (DELTASONE) 10 MG tablet Take 3 pills once daily by  mouth for 3 days, then 2 pills once daily for 3 days, then 1 pill once daily for 3 days and then stop 18 tablet 0   No current facility-administered medications on file prior to visit.     Observations/Objective: Patient appears fairly well at the time of visit  She is cheerful and talkative (says she feels better right this minute) No facial swelling or asymmetry  Nl voice-not hoarse No throat clearing or stridor No sob or wheezing or cough  No rash or skin change and no pallor   Assessment and Plan: Problem List Items Addressed This Visit      Other   Fever    Fever with mild ST/ body aches and headache  No nasal symptoms or cough at this time  She was sick with flu like illness in march-tested neg for covid  This improved before new symptoms started this weekend She is not in distress Had considerable improvement in symptoms with naproxen otc (today) Enc fluids/rest/analgesics Watch for cough or other new symptoms  Would consider covid testing again if it becomes available Update if not starting to improve in a week or if worsening            Follow Up Instructions:  IThough you tested negative in march- I cannot now guarantee you do not have covid given new symptoms since this weekend Continue to isolate yourself (until you are totally better and fever from without medication for at least 3 days)  I will investigate options for covid testing in [place]  Fluids/rest Naproxen with food or tylenol for fever/body aches If severe headache or neck stiffness-go to the ER If cough or other new symptoms develop please let us know  We will check in with you later this week ed the assessment and treatment plan with the patient. The patient was provided an opportunity to ask questions and all were answered. The patient agreed with the plan and demonstrated an understanding of the instructions.   The patient was advised to call back or seek an in-person evaluation if the  symptoms worsen or if the condition fails to improve as anticipated.     Roxy MannsMarne Shenee Wignall, MD

## 2019-03-31 NOTE — Telephone Encounter (Signed)
Pt left v/m; for 2 days pt has H/A,chills,fever; has gone from 99 - 101,( pt cannot take temp now; pt downstairs and thermometer is upstairs), S/T in evening and entire body feels like has been beaten; pt started taking naproxen to help with body aches. CP on and off ; last had CP this morning;more of pressure than pain in chest which last for few mins.;happens daily; no distress now.No cough,SOB,diarrhea and pt lost sense of smell and taste in March and not sure if senses are back yet. Pt was tested in March and covid was neg but illness progresses like pt has covid 19.no travel and no known +covid or flu. Pt did in office visit with similar symptoms 01/31/19.

## 2019-03-31 NOTE — Patient Instructions (Signed)
Though you tested negative in march- I cannot now guarantee you do not have covid given new symptoms since this weekend Continue to isolate yourself (until you are totally better and fever from without medication for at least 3 days)  I will investigate options for covid testing in [place]  Fluids/rest Naproxen with food or tylenol for fever/body aches If severe headache or neck stiffness-go to the ER If cough or other new symptoms develop please let us know  We will check in with you later this week

## 2019-04-03 ENCOUNTER — Telehealth: Payer: Self-pay | Admitting: *Deleted

## 2019-04-03 NOTE — Telephone Encounter (Signed)
This is re assuring-continue to keep [place] posted and call at any time if symptoms get worse instead of better

## 2019-04-03 NOTE — Telephone Encounter (Signed)
Per Covid-19 protocol we need to call and f/u with pt.  Pt said today was the 1st day she woke up without a fever, it finally broke this morning. She said that she still does have bodyaches and pain but it has gotten a lot better. She doesn't have any cough or congestion. Pt said 2 days ago her eyes started burning and turned pick, she had some leftover eye drops when she had pinkeye and she started using that and it has helped her eyes.   Pt said the main sxs that are still bothering her is she is still dealing with a HA, and back pain but the back pain has improved also. Also again she still has some bodyaches but they are not as bad

## 2019-04-03 NOTE — Telephone Encounter (Signed)
Pt notified of Dr. Tower's comments  

## 2019-05-27 ENCOUNTER — Telehealth: Payer: Self-pay | Admitting: *Deleted

## 2019-05-27 NOTE — Telephone Encounter (Signed)
Patient called stating that her blood pressure is usually normal, but has been elevated recently. Patient stated that she had it checked recently and it was 189/100.and after relaxing a few minutes it came down. . Patient stated that her friend which is a physical therapist suggested that she get a wrist BP monitor kit, so she did. Patient stated that she checked her blood pressure today and it was 158/78. Patient stated that she is not having any symptoms. Appointment scheduled with Dr.Tower 05/28/19 at 3:15. Advised patient that if her blood pressure goes up, she develops headache, dizziness, SOB, numbness or weakness before her appointment tomorrow she should call 911 or ER and she verbalized understanding.

## 2019-05-27 NOTE — Telephone Encounter (Signed)
Aware- I will see her then Always check bp when totally relaxed with both feet on the floor  Bring cuff with her to visit  Thanks

## 2019-05-27 NOTE — Telephone Encounter (Signed)
Pt notified of Dr. Marliss Coots instructions and she will bring BP cuff tomorrow to OV

## 2019-05-28 ENCOUNTER — Other Ambulatory Visit: Payer: Self-pay

## 2019-05-28 ENCOUNTER — Ambulatory Visit: Payer: BC Managed Care – PPO | Admitting: Family Medicine

## 2019-05-28 ENCOUNTER — Encounter: Payer: Self-pay | Admitting: Family Medicine

## 2019-05-28 VITALS — BP 132/90 | HR 78 | Temp 98.2°F | Ht 69.0 in | Wt 290.5 lb

## 2019-05-28 DIAGNOSIS — E669 Obesity, unspecified: Secondary | ICD-10-CM | POA: Insufficient documentation

## 2019-05-28 DIAGNOSIS — I10 Essential (primary) hypertension: Secondary | ICD-10-CM | POA: Insufficient documentation

## 2019-05-28 NOTE — Progress Notes (Signed)
Subjective:    Patient ID: [name], female    DOB: 10/25/1966, 53 y.o.   MRN: [id number]  HPI Here for elevated blood pressure   Has suffered from shoulder problems lately -went to PT office (friend works there)  [facility] helped her pain however -working with neck     Wt Readings from Last 3 Encounters:  05/28/19 290 lb 8 oz (131.8 kg)  01/31/19 284 lb 2 oz (128.9 kg)  07/09/18 284 lb 8 oz (129 kg)  doing opta via program for wt loss  [id number] cal per day  Less sodium  Has lost 5 lb  42.90 kg/m   Pt called yesterday that her usually nl bp has been elevated  At one time it was 189/100 (came down after resting) -at PT (then 149)  She did get a wrist monitor Yesterday 158/78 at home  No symptoms from BP   BP Readings from Last 3 Encounters:  05/28/19 136/90  01/31/19 116/74  07/09/18 126/70   Her cuff gave reading of 161/86 and ours 136/90  She was taking some ibuprofen on and off for shoulder pain  Last dose yesterday evening  [place] the beach (mild)   No menses for a year   fam hx  Mother had high cholesterol but no bp problems Never knew her father (but his side of the family had young deaths from heart dz she thinks)  Pt herself had a stress test in the past   Exercise: was walking 1-2 mi per day  Then "popped" her achilles at the beach - that is slowly getting better     Lab Results  Component Value Date   CREATININE 1.10 12/18/2013   BUN 19 (H) 12/18/2013   NA 138 12/18/2013   K 4.7 12/18/2013   CL 105 12/18/2013   CO2 30 12/18/2013   Lab Results  Component Value Date   WBC 7.4 07/02/2018   HGB 13.7 07/02/2018   HCT 40.9 07/02/2018   MCV 83.9 07/02/2018   PLT 284.0 07/02/2018    Patient Active Problem List   Diagnosis Date Noted  . Morbid obesity (HCC) 05/28/2019  . Hypertension, essential 05/28/2019  . Fever 03/31/2019  . 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 Multiple joint pain 07/02/2018  . Foot pain,  bilateral 07/02/2018  . Low back pain 02/26/2018   Past Medical History:  Diagnosis Date  . Concussion 2008  . Hx of migraines   . Viral meningitis    No past surgical history on file. Social History   Tobacco Use  . Smoking status: Former Smoker    Packs/day: 0.25    Years: 3.00    Pack years: 0.75    Types: Cigarettes    Quit date: 08/25/2013    Years since quitting: 5.7  . Smokeless tobacco: Never Used  . Tobacco comment: no smoking in 5 days  Substance Use Topics  . Alcohol use: Yes    Alcohol/week: 1.0 standard drinks    Types: 1 Cans of beer per week    Comment: occ  . Drug use: No   No family history on file. Allergies  Allergen Reactions  . Vancomycin Rash    Red man reaction   Current Outpatient Medications on File Prior to Visit  Medication Sig Dispense Refill  . ibuprofen (ADVIL) 600 MG tablet Take 600 mg by mouth every 6 (six) hours as needed.     No current facility-administered medications on file prior to  visit.      Review of Systems  Constitutional: Negative for activity change, appetite change, fatigue, fever and unexpected weight change.  HENT: Negative for congestion, ear pain, rhinorrhea, sinus pressure and sore throat.   Eyes: Negative for pain, redness and visual disturbance.  Respiratory: Negative for cough, shortness of breath and wheezing.   Cardiovascular: Negative for chest pain and palpitations.  Gastrointestinal: Negative for abdominal pain, blood in stool, constipation and diarrhea.  Endocrine: Negative for polydipsia and polyuria.  Genitourinary: Negative for dysuria, frequency and urgency.       Amenorrhea ? Menopause   Musculoskeletal: Negative for arthralgias, back pain and myalgias.  Skin: Negative for pallor and rash.  Allergic/Immunologic: Negative for environmental allergies.  Neurological: Negative for dizziness, syncope and headaches.  Hematological: Negative for adenopathy. Does not bruise/bleed easily.   Psychiatric/Behavioral: Negative for decreased concentration and dysphoric mood. The patient is not nervous/anxious.        Objective:   Physical Exam Constitutional:      General: She is not in acute distress.    Appearance: Normal appearance. She is well-developed. She is obese. She is not ill-appearing or diaphoretic.  HENT:     Head: Normocephalic and atraumatic.     Mouth/Throat:     Mouth: Mucous membranes are moist.     Pharynx: Oropharynx is clear.  Eyes:     Conjunctiva/sclera: Conjunctivae normal.     Pupils: Pupils are equal, round, and reactive to light.  Neck:     Musculoskeletal: Normal range of motion and neck supple.     Thyroid: No thyromegaly.     Vascular: No carotid bruit or JVD.  Cardiovascular:     Rate and Rhythm: Normal rate and regular rhythm.     Pulses: Normal pulses.     Heart sounds: Normal heart sounds. No gallop.   Pulmonary:     Effort: Pulmonary effort is normal. No respiratory distress.     Breath sounds: Normal breath sounds. No wheezing or rales.     Comments: Good air exch Abdominal:     General: Bowel sounds are normal. There is no distension or abdominal bruit.     Palpations: Abdomen is soft. There is no mass.     Tenderness: There is no abdominal tenderness.  Musculoskeletal:     Right lower leg: No edema.     Left lower leg: No edema.  Lymphadenopathy:     Cervical: No cervical adenopathy.  Skin:    General: Skin is warm and dry.     Findings: No rash.  Neurological:     Mental Status: She is alert. Mental status is at baseline.     Coordination: Coordination normal.     Gait: Gait normal.     Deep Tendon Reflexes: Reflexes are normal and symmetric. Reflexes normal.  Psychiatric:        Mood and Affect: Mood normal.           Assessment & Plan:   Problem List Items Addressed This Visit      Cardiovascular and Mediastinum   Hypertension, essential - Primary    New diagnosis in obese female /mild  Pt would like to  begin by working on lifestyle change which is reasonable BP: 132/90  Handouts given  She may look into getting an omron cuff for the arm  Working hard on wt loss  Disc DASH eating and exercise Lab today  F/u 3 mo      Relevant Orders  CBC with Differential/Platelet   Comprehensive metabolic panel   Lipid panel   TSH     Other   Morbid obesity (HCC)    Discussed how this problem influences overall health and the risks it imposes  Reviewed plan for weight loss with lower calorie diet (via better food choices and also portion control or program like weight watchers) and exercise building up to or more than 30 minutes 5 days per week including some aerobic activity   Per pt -recently started wt loss program and lost 5 lb Enc regular exercise  F/u 3 mo

## 2019-05-28 NOTE — Assessment & Plan Note (Signed)
New diagnosis in obese female /mild  Pt would like to begin by working on lifestyle change which is reasonable BP: 132/90  Handouts given  She may look into getting an omron cuff for the arm  Working hard on wt loss  Disc DASH eating and exercise Lab today  F/u 3 mo

## 2019-05-28 NOTE — Patient Instructions (Addendum)
Exercise when you can safely do it  Keep loosing weight   Eat low sodium  Walk when calf heals   Your cuff probably runs high  I like the omron cuff for the arm/size large -if you want to buy another one   Let's do some labs today   Follow up in 3 month

## 2019-05-28 NOTE — Assessment & Plan Note (Signed)
Discussed how this problem influences overall health and the risks it imposes  Reviewed plan for weight loss with lower calorie diet (via better food choices and also portion control or program like weight watchers) and exercise building up to or more than 30 minutes 5 days per week including some aerobic activity   Per pt -recently started wt loss program and lost 5 lb Enc regular exercise  F/u 3 mo

## 2019-05-29 LAB — CBC WITH DIFFERENTIAL/PLATELET
Basophils Absolute: 0.1 10*3/uL (ref 0.0–0.1)
Basophils Relative: 1.3 % (ref 0.0–3.0)
Eosinophils Absolute: 0.5 10*3/uL (ref 0.0–0.7)
Eosinophils Relative: 6.1 % — ABNORMAL HIGH (ref 0.0–5.0)
HCT: 40.6 % (ref 36.0–46.0)
Hemoglobin: 13.1 g/dL (ref 12.0–15.0)
Lymphocytes Relative: 32.6 % (ref 12.0–46.0)
Lymphs Abs: 2.5 10*3/uL (ref 0.7–4.0)
MCHC: 32.2 g/dL (ref 30.0–36.0)
MCV: 84.2 fl (ref 78.0–100.0)
Monocytes Absolute: 0.5 10*3/uL (ref 0.1–1.0)
Monocytes Relative: 6.8 % (ref 3.0–12.0)
Neutro Abs: 4.1 10*3/uL (ref 1.4–7.7)
Neutrophils Relative %: 53.2 % (ref 43.0–77.0)
Platelets: 289 10*3/uL (ref 150.0–400.0)
RBC: 4.82 Mil/uL (ref 3.87–5.11)
RDW: 14.4 % (ref 11.5–15.5)
WBC: 7.8 10*3/uL (ref 4.0–10.5)

## 2019-05-29 LAB — COMPREHENSIVE METABOLIC PANEL
ALT: 27 U/L (ref 0–35)
AST: 24 U/L (ref 0–37)
Albumin: 4.3 g/dL (ref 3.5–5.2)
Alkaline Phosphatase: 65 U/L (ref 39–117)
BUN: 28 mg/dL — ABNORMAL HIGH (ref 6–23)
CO2: 30 mEq/L (ref 19–32)
Calcium: 9.7 mg/dL (ref 8.4–10.5)
Chloride: 102 mEq/L (ref 96–112)
Creatinine, Ser: 0.92 mg/dL (ref 0.40–1.20)
GFR: 64.18 mL/min (ref >60.00)
Glucose, Bld: 101 mg/dL — ABNORMAL HIGH (ref 70–99)
Potassium: 4.9 mEq/L (ref 3.5–5.1)
Sodium: 139 mEq/L (ref 135–145)
Total Bilirubin: 0.3 mg/dL (ref 0.2–1.2)
Total Protein: 6.8 g/dL (ref 6.0–8.3)

## 2019-05-29 LAB — LIPID PANEL
Cholesterol: 185 mg/dL (ref 0–200)
HDL: 51.3 mg/dL (ref >39.00)
NonHDL: 133.68
Total CHOL/HDL Ratio: 4
Triglycerides: 247 mg/dL — ABNORMAL HIGH (ref 0.0–149.0)
VLDL: 49.4 mg/dL — ABNORMAL HIGH (ref 0.0–40.0)

## 2019-05-29 LAB — LDL CHOLESTEROL, DIRECT: Direct LDL: 121 mg/dL

## 2019-05-29 LAB — TSH: TSH: 2.09 u[IU]/mL (ref 0.35–4.50)

## 2019-06-05 ENCOUNTER — Telehealth: Payer: Self-pay

## 2019-06-05 DIAGNOSIS — M25512 Pain in left shoulder: Secondary | ICD-10-CM | POA: Insufficient documentation

## 2019-06-05 NOTE — Telephone Encounter (Signed)
I ordered a left shoulder xray  Please call her to put her on the schedule for the film  Thanks

## 2019-06-05 NOTE — Telephone Encounter (Signed)
Pt seen on 05/28/19; pt is waking up at night due to lt shoulder pain; the physical therapist exercises are not helping the pain and pt said Dr Glori Bickers told her at 05/28/19 that if the exercises did not help to let her know. Pt wants to know what is next step and does pt need xrays are imaging. Pt request cb. Naproxen is not helping the pain. Please advise. [facility] [address].

## 2019-06-05 NOTE — Telephone Encounter (Signed)
Pt is coming in tomorrow for her xray

## 2019-06-06 ENCOUNTER — Other Ambulatory Visit: Payer: Self-pay

## 2019-06-06 ENCOUNTER — Ambulatory Visit (INDEPENDENT_AMBULATORY_CARE_PROVIDER_SITE_OTHER)
Admission: RE | Admit: 2019-06-06 | Discharge: 2019-06-06 | Disposition: A | Discharge status: Home / Self Care | Payer: BC Managed Care – PPO | Source: Ambulatory Visit | Attending: Family Medicine | Admitting: Family Medicine

## 2019-06-06 DIAGNOSIS — M25512 Pain in left shoulder: Secondary | ICD-10-CM

## 2019-06-13 DIAGNOSIS — M0230 Reiter's disease, unspecified site: Secondary | ICD-10-CM | POA: Diagnosis not present

## 2019-06-13 DIAGNOSIS — M797 Fibromyalgia: Secondary | ICD-10-CM | POA: Diagnosis not present

## 2019-06-18 ENCOUNTER — Encounter: Payer: Self-pay | Admitting: Family Medicine

## 2019-06-18 ENCOUNTER — Other Ambulatory Visit: Payer: Self-pay

## 2019-06-18 ENCOUNTER — Ambulatory Visit: Payer: BC Managed Care – PPO | Admitting: Family Medicine

## 2019-06-18 VITALS — BP 150/70 | HR 77 | Temp 98.2°F | Ht 69.0 in | Wt 287.2 lb

## 2019-06-18 DIAGNOSIS — M25512 Pain in left shoulder: Secondary | ICD-10-CM

## 2019-06-18 DIAGNOSIS — M7502 Adhesive capsulitis of left shoulder: Secondary | ICD-10-CM

## 2019-06-18 NOTE — Progress Notes (Addendum)
[name] T. [name], MD Primary Care and [place] at [facility] [place], [address] Phone: [phone]  FAX: [place] - 53 y.o. female  MRN [id number]  Date of Birth: 25-Feb-1966  Visit Date: 06/18/2019  PCP: Abner Greenspan, MD  Referred by: Tower, Wynelle Fanny, MD  Chief Complaint  Patient presents with  . Shoulder Pain    Left   Subjective:   [name] is a 53 y.o. 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  The patient noted above presents with shoulder pain that has been ongoing for 6 months. there is no history of trauma or accident. The patient denies neck pain or radicular symptoms. No shoulder blade pain Denies dislocation, subluxation, separation of the shoulder. The patient does complain of pain with flexion, abduction, and terminal motion.  Significant restriction of motion. she describes a deep ache around the shoulder, and sometimes it will wake the patient up at night.  6 months ago injured her shoulder and did some masssage and ovr there last month it has gotten much worse.  Works with a Restaurant manager, fast food.  Over the last few days, will work and at the end of the massage and difficulty. She also broke her toe recently and was less active.  Adjustment did not help.  Plain film normal   Medications Tried: OTC tylenol and alleve Ice or Heat: minimal help Tried PT: massage  Prior shoulder Injury: No Prior surgery: No Prior fracture: No  Past Medical History, Surgical History, Social History, Family History, Medications, and allergies reviewed and updated if relevant.   Patient Active Problem List   Diagnosis Date Noted  . Shoulder pain, left 06/05/2019  . Morbid obesity ([place]) 05/28/2019  . Hypertension, essential 05/28/2019  . Fever 03/31/2019  . 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  Multiple joint pain 07/02/2018  . Foot pain, bilateral 07/02/2018  . Low back pain 02/26/2018    Past Medical History:  Diagnosis Date  . Concussion 2008  . Hx of migraines   . Viral meningitis     History reviewed. No pertinent surgical history.  Social History   Socioeconomic History  . Marital status: Married    Spouse name: Not on file  . Number of children: Not on file  . Years of education: Not on file  . Highest education level: Not on file  Occupational History  . Occupation: [employer] - in [place]  . Financial resource strain: Not on file  . Food insecurity    Worry: Not on file    Inability: Not on file  . Transportation needs    Medical: Not on file    Non-medical: Not on file  Tobacco Use  . Smoking status: Former Smoker    Packs/day: 0.25    Years: 3.00    Pack years: 0.75    Types: Cigarettes    Quit date: 08/25/2013    Years since quitting: 5.8  . Smokeless tobacco: Never Used  . Tobacco comment: no smoking in 5 days  Substance and Sexual Activity  . Alcohol use: Yes    Alcohol/week: 1.0 standard drinks    Types: 1 Cans of beer per week    Comment: occ  . Drug use: No  . Sexual activity: Not on file  Lifestyle  . Physical activity    Days  per week: Not on file    Minutes per session: Not on file  . Stress: Not on file  Relationships  . Social Musicianconnections    Talks on phone: Not on file    Gets together: Not on file    Attends religious service: Not on file    Active member of club or organization: Not on file    Attends meetings of clubs or organizations: Not on file    Relationship status: Not on file  . Intimate partner violence    Fear of current or ex partner: Not on file    Emotionally abused: Not on file    Physically abused: Not on file    Forced sexual activity: Not on file  Other Topics Concern  . Not on file  Social History Narrative  . Not on file    History reviewed. No pertinent family  history.  Allergies  Allergen Reactions  . Vancomycin Rash    Red man reaction    Medication list reviewed and updated in full in [facility].  GEN: No fevers, chills. Nontoxic. Primarily MSK c/o today. MSK: Detailed in the HPI GI: tolerating PO intake without difficulty Neuro: No numbness, parasthesias, or tingling associated. Otherwise the pertinent positives of the ROS are noted above.    Objective:   Blood pressure (!) 150/70, pulse 77, temperature 98.2 F (36.8 C), temperature source Temporal, height 5\' 9"  (1.753 m), weight 287 lb 4 oz (130.3 kg), SpO2 97 %.   GEN: WDWN, NAD, Non-toxic, Alert & Oriented x 3 HEENT: Atraumatic, Normocephalic.  Ears and Nose: No external deformity. EXTR: No clubbing/cyanosis/edema NEURO: Normal gait.  PSYCH: Normally interactive. Conversant. Not depressed or anxious appearing.  Calm demeanor.   Shoulder: R and L Inspection: No muscle wasting or winging Ecchymosis/edema: neg  AC joint, scapula, clavicle: NT Cervical spine: NT, full ROM Spurling's: neg ABNORMAL SIDE TESTED: L UNLESS OTHERWISE NOTED, THE CONTRALATERAL SIDE HAS FULL RANGE OF MOTION. Abduction: 5/5, LIMITED TO 100 DEGREES Flexion: 5/5, LIMITED TO 100 DEGNO ROM  IR, lift-off: 5/5. TESTED AT 90 DEGREES OF ABDUCTION, LIMITED TO 0 DEGREES ER at neutral:  5/5, TESTED AT 90 DEGREES OF ABDUCTION, LIMITED TO 10 DEGREES AC crossover and compression: PAIN Drop Test: neg Empty Can: neg Supraspinatus insertion: NT Bicipital groove: NT ALL OTHER SPECIAL TESTING EQUIVOCAL GIVEN LOSS OF MOTION C5-T1 intact Sensation intact Grip 5/5   Assessment and Plan:     ICD-10-CM   1. Adhesive capsulitis of left shoulder  M75.02   2. Left shoulder pain, unspecified chronicity  M25.512   3. Acute pain of left shoulder  M25.512 MR Shoulder Left Wo Contrast    >25 minutes spent in face to face time with patient, >50% spent in counselling or coordination of care  Patient was given a  systematic ROM protocol from [facility] to be done daily. Emphasized importance of adherence, help of PT, daily HEP.  The average length of total symptoms is 12-18 months going through 3 different phases in the freezing and thawing process. Reviewed all with patient.   Tylenol or NSAID of choice prn for pain relief Intraarticular shoulder injections discussed with patient, which have good evidence for accelerating the thawing phase, but she declines.  Patient will be sent for formal PT for aggressive frozen shoulder ROM when approaching the thawing phase. Will need RTC str and scapular stabilization to fix underlying mechanics.  I appreciate the opportunity to evaluate this very friendly patient. If you have any  question regarding her care or prognosis, do not hesitate to ask.  Addendum, 07/04/2019: The patient has called back in excrutiating, severe pain.  She has thus fas failed six months of conservative care.  Obtain an MRI of the left shoulder to evaluate for underlying rotator cuff tear.  Electronically Signed  By: Hannah BeatSpencer Jersee Winiarski, MD On: 07/04/2019 9:13 AM   Follow-up: Return in about 2 months (around 08/19/2019).  No orders of the defined types were placed in this encounter.  Orders Placed This Encounter  Procedures  . MR Shoulder Left Wo Contrast    Signed,  Charlena Haub T. Khylan Sawyer, MD   Patient's Medications  New Prescriptions   No medications on file  Previous Medications   NAPROXEN (NAPROSYN) 500 MG TABLET    Take 500 mg by mouth 2 (two) times daily with a meal.   TRAMADOL (ULTRAM) 50 MG TABLET    Take 50 mg by mouth every 6 (six) hours as needed.  Modified Medications   No medications on file  Discontinued Medications   IBUPROFEN (ADVIL) 600 MG TABLET    Take 600 mg by mouth every 6 (six) hours as needed.

## 2019-07-04 ENCOUNTER — Encounter: Payer: Self-pay | Admitting: Family Medicine

## 2019-07-04 NOTE — Addendum Note (Signed)
Addended by: Owens Loffler on: 07/04/2019 09:13 AM   Modules accepted: Orders

## 2019-07-10 ENCOUNTER — Inpatient Hospital Stay: Admission: RE | Admit: 2019-07-10 | Payer: BC Managed Care – PPO | Source: Ambulatory Visit

## 2019-07-11 ENCOUNTER — Other Ambulatory Visit: Payer: Self-pay

## 2019-07-11 ENCOUNTER — Ambulatory Visit
Admission: RE | Admit: 2019-07-11 | Discharge: 2019-07-11 | Disposition: A | Discharge status: Home / Self Care | Payer: BC Managed Care – PPO | Source: Ambulatory Visit | Attending: Family Medicine | Admitting: Family Medicine

## 2019-07-11 DIAGNOSIS — M19012 Primary osteoarthritis, left shoulder: Secondary | ICD-10-CM | POA: Diagnosis not present

## 2019-07-11 DIAGNOSIS — M25512 Pain in left shoulder: Secondary | ICD-10-CM

## 2019-07-12 ENCOUNTER — Emergency Department: Payer: BC Managed Care – PPO

## 2019-07-12 ENCOUNTER — Emergency Department
Admission: EM | Admit: 2019-07-12 | Discharge: 2019-07-13 | Disposition: A | Discharge status: Home / Self Care | Payer: BC Managed Care – PPO | Attending: Emergency Medicine | Admitting: Emergency Medicine

## 2019-07-12 DIAGNOSIS — R2232 Localized swelling, mass and lump, left upper limb: Secondary | ICD-10-CM | POA: Diagnosis not present

## 2019-07-12 DIAGNOSIS — M7989 Other specified soft tissue disorders: Secondary | ICD-10-CM | POA: Diagnosis not present

## 2019-07-12 DIAGNOSIS — M25512 Pain in left shoulder: Secondary | ICD-10-CM | POA: Diagnosis not present

## 2019-07-12 NOTE — ED Provider Notes (Signed)
History     Chief Complaint   Patient presents with    Arm Swelling     Patient presents the emergency department complaining of left arm swelling.  Patient with a recent left shoulder injury after accidental trip and fall.  Had an MRI performed of her shoulder to evaluate for rotator cuff injury, unknown result.  Recently drove up from [facility], several hours.  Today she noticed significant swelling to wrist and forearm.  Denies redness or warmth.  Denies numbness or weakness.  Denies previous injury to same.  No history of clot.  No history of anticoagulation.      The history is provided by the patient and a relative.        Nursing (triage) note reviewed for the following pertinent information:  left arm swelling    History reviewed. No pertinent past medical history.    History reviewed. No pertinent surgical history.    History reviewed. No pertinent family history.    Social  Social History     Tobacco Use    Smoking status: Not on file   Substance Use Topics    Alcohol use: Not on file    Drug use: Not on file       .     No Known Allergies    Home Medications     No Medications           Review of Systems   Constitutional: Negative.    Respiratory: Negative.    Cardiovascular: Negative.    Gastrointestinal: Negative.    Musculoskeletal: Positive for arthralgias. Negative for back pain, gait problem, joint swelling, myalgias, neck pain and neck stiffness.   Skin: Negative.    Neurological: Negative.    Psychiatric/Behavioral: Negative.    All other systems reviewed and are negative.      Physical Exam    BP: (!) 147/94, Heart Rate: 82, Temp: 98.3 F (36.8 C), Resp Rate: 16, SpO2: 100 %, Weight: 130.4 kg    Physical Exam  Vitals signs and nursing note reviewed.   Constitutional:       General: She is not in acute distress.     Appearance: Normal appearance.   HENT:      Head: Normocephalic.   Musculoskeletal:         General: Swelling and tenderness present. No deformity or signs of injury.       Right shoulder: Normal.      Left shoulder: She exhibits decreased range of motion, tenderness, swelling and pain. She exhibits no bony tenderness, no effusion, no crepitus, no deformity, no laceration, no spasm, normal pulse and normal strength.      Right elbow: Normal.     Left elbow: Normal.      Right wrist: Normal.      Left wrist: Normal.      Left upper arm: She exhibits swelling.      Right lower leg: No edema.      Left lower leg: No edema.      Comments: Anterior left shoulder swelling no erythema.  Mild diminished active range of motion.  No bony tenderness, no AC tenderness, no glenohumeral tenderness.  Mild posterior muscular tenderness.  No numbness or weakness.  No swelling to proximal arm, elbow, wrist.  2+ distal pulses.  No streaking.  No lymphadenopathy.     Skin:     General: Skin is warm.      Capillary Refill: Capillary refill takes less than 2  seconds.   Neurological:      General: No focal deficit present.      Mental Status: She is alert.      Sensory: Sensation is intact.      Motor: Motor function is intact.      Coordination: Coordination is intact.           MDM and ED Course     ED Medication Orders (From admission, onward)    None             MDM  Number of Diagnoses or Management Options  Left arm swelling: new and requires workup  Diagnosis management comments:   I, Adelene Idler, Physician Assistant, have been the primary provider for this pt during their ED stay.     The attending signature signifies review and agreement of the history, physical examination, evaluation, clinical impression and plan except as noted.     02 sat is 100% on ra, which is nml.     Patient with left arm swelling, shoulder swelling.  No bony tenderness, no erythema warmth or streaking.  Patient with recent rotator cuff injury, MRI pending result.,  Recent frozen shoulder.  No neurovascular compromise.    Lab/Rad results reviewed include:     US Venous Dopp Upper Extrem Left   Final Result              No  evidence of deep vein thrombosis in the left upper extremity.        Demetrios Isaacs, MD     07/13/2019 12:35 AM     Discussed results at length with patient.  Stable for discharge home with supportive care.    Pt given typical ED return precautions with worsening or changing symptoms, and verbalizes understanding of need for follow-up.     *This note was generated by the Epic EMR system/ Dragon speech recognition and may contain inherent errors or omissions not intended by the user. Grammatical errors, random word insertions, deletions, pronoun errors and incomplete sentences are occasional consequences of this technology due to software limitations. Not all errors are caught or corrected. If there are questions or concerns about the content of this note or information contained within the body of this dictation they should be addressed directly with the author for clarification         Amount and/or Complexity of Data Reviewed  Tests in the radiology section of CPT: ordered and reviewed  Obtain history from someone other than the patient: yes  Review and summarize past medical records: yes  Discuss the patient with other providers: yes    Risk of Complications, Morbidity, and/or Mortality  Presenting problems: moderate  Diagnostic procedures: moderate  Management options: moderate    Patient Progress  Patient progress: stable                   Procedures    Clinical Impression & Disposition     Clinical Impression  Final diagnoses:   Left arm swelling        ED Disposition     ED Disposition Condition Date/Time Comment    Discharge  Sun Jul 13, 2019 12:51 AM Oswald Hillock discharge to home/self care.    Condition at disposition: Stable           There are no discharge medications for this patient.                Jamse Arn, [place]  07/14/19 [phone]

## 2019-07-12 NOTE — ED Triage Notes (Signed)
Pt states that has been having swelling in her left arm from the elbow to wrist. States that she has a frozen shoulder that she recently had a mri for. Stated pain 3/10-tylenol last taken at 1500, aleve taken at 2200. Patient has been questioned about the presence of weapons so that these items may be recovered and secured appropriately. Denies the current possession of any weapons.

## 2019-07-13 NOTE — Discharge Instructions (Signed)
DVT LE Ruled Out    Follow-up with your physician upon return home to [facility] for MRI results.  You should elevate arm, compression      You were seen for your arm symptoms.    This can include redness, swelling and/or pain. In these cases, the doctor is often worried that you had a blood clot in your arm. This is also called a deep venous thrombosis (DVT).    The diagnosis is often from an ultrasound of the arm. This is also called a duplex Doppler ultrasound. This is a non-invasive (on the outside of your body) test. It can detect a blood clot in the veins. An ultrasound specialist does the test. A radiologist or vascular (blood vessel) specialist looks at it. The test uses sound waves to look at the veins. It can help decide if they have blood flowing in them or if they are clotted. The test is very good at finding blood clots in the upper arm (thigh). It can also find clots in the lower arm (calf). However, the test is not as good in this part of the arm. The smaller the clot, the harder it can be to find. In these cases, the ultrasound can be re-done in a few days. The doctor will decide if this is needed.    The ultrasound of your arm was normal. It did not show any blood clots.    The exact cause of your problem is still not known. We dont know the exact cause of your symptoms. However, your doctor thinks it's safe for you to go home. You may need to see your doctor or referral doctor for more tests.    Medical problems can be very complex and some conditions can be very difficult to figure out, even with advanced medical testing. It is important to understand that even after an Emergency Department (or Urgent Care) visit or an observation stay, you may still have a serious medical problem.    YOU SHOULD SEEK MEDICAL ATTENTION IMMEDIATELY, EITHER HERE OR AT THE NEAREST EMERGENCY DEPARTMENT, IF ANY OF THE FOLLOWING OCCURS:   Your arm swelling gets worse.   Your arms get red and you have pain /  fever (temperature higher than 100.61F / 38C).   You have any chest pain or tightness or pressure over your heart.   You are short of breath, especially if there is pain in your chest when you breathe in.   Your arm feels numb (no feeling) or cool.   Fainting.   Your symptoms or problems get worse.

## 2019-07-14 ENCOUNTER — Telehealth: Payer: Self-pay

## 2019-07-14 MED ORDER — NAPROXEN 500 MG PO TABS: 500.0000 mg | ORAL_TABLET | Freq: Two times a day (BID) | ORAL | 1 refills | Status: DC

## 2019-07-14 NOTE — Telephone Encounter (Signed)
Left message for patient to call back to follow up on arm pain. Per MRI results that Dr Lorelei Pont reviewed and sent message to patient it states he can do injection of the shoulder and order P.T. Need to see if patient want to proceed.

## 2019-07-14 NOTE — Telephone Encounter (Signed)
I sent the naproxen

## 2019-07-14 NOTE — Telephone Encounter (Signed)
[name] Gender: Female DOB: 28-Aug-1966 Age: 53 Y 9 M 28 D Return Phone Number: [phone] (Primary) Address: City/State/Zip: [place] [address] Client  Primary [facility] Night - Client Client Site [place] Physician Copland, Frederico Hamman - MD Contact Type Call Who Is Calling Patient / Member / Family / Caregiver Call Type Triage / Clinical Relationship To Patient Self Return Phone Number [phone] (Primary) Chief Complaint Arm Pain (known cause) Reason for Call Symptomatic / Request for Health Information Initial Comment Caller states Lt arm swelling and pain , she seen the Dr and was Dx with Frozen Shoulder [place] [facility] List[facility] ED Translation No Nurse Assessment Nurse: Orpah Clinton, RN, Lattie Haw Date/Time (Eastern Time): 07/12/2019 9:30:19 PM Confirm and document reason for call. If symptomatic, describe symptoms. ---Caller states Lt arm swelling and increased pain since she saw the Dr Ludwig Clarks and was Dx with Frozen Shoulder. Had MRI on fri. Has the patient had close contact with a person known or suspected to have the novel coronavirus illness OR traveled / lives in area with major community spread (including international travel) in the last 14 days from the onset of symptoms? * If Asymptomatic, screen for exposure and travel within the last 14 days. ---No Does the patient have any new or worsening symptoms? ---Yes Will a triage be completed? ---Yes Related visit to physician within the last 2 weeks? ---Yes Does the PT have any chronic conditions? (i.e. diabetes, asthma, this includes High risk factors for pregnancy, etc.) ---Yes List chronic conditions. ---reactive arthritis, fibromyalgia Is the patient pregnant or possibly pregnant? (Ask all females between the ages of 5-55) ---No Is this a behavioral health or substance abuse call? ---No Guidelines Guideline Title Affirmed Question Affirmed  Notes Nurse Date/Time (Eastern Time) Arm Pain Entire arm is swollen McCasland, RN, Lattie Haw 07/12/2019 9:33:56 PM Disp. Time Eilene Ghazi Time) Disposition Final User PLEASE NOTE: All timestamps contained within this report are represented as [place] Standard Time. CONFIDENTIALTY NOTICE: This fax transmission is intended only for the addressee. It contains information that is legally privileged, confidential or otherwise protected from use or disclosure. If you are not the intended recipient, you are strictly prohibited from reviewing, disclosing, copying using or disseminating any of this information or taking any action in reliance on or regarding this information. If you have received this fax in error, please notify us immediately by telephone so that we can arrange for its return to [place]. Phone: [phone], Toll-Free: [phone], Fax: [phone] Page: 2 of 2 Call Id: [id number] 07/12/2019 9:41:14 PM See HCP within 4 Hours (or PCP triage) Yes McCasland, RN, Leland Johns Disagree/Comply Comply Caller Understands Yes PreDisposition Did not know what to do Care Advice Given Per Guideline SEE HCP WITHIN 4 HOURS (OR PCP TRIAGE): * IF OFFICE WILL BE CLOSED AND NO PCP (PRIMARY CARE PROVIDER) SECOND-LEVEL TRIAGE: You need to be seen within the next 3 or 4 hours. A nearby Urgent Care Center [facility]) is often a good source of care. Another choice is to go to the ED. Go sooner if you become worse. * For pain relief, take acetaminophen, ibuprofen, or naproxen. CALL BACK IF: * You become worse. CARE ADVICE given per Arm Pain (Adult) guideline. Comments User: Larene Beach, RN Date/Time Eilene Ghazi Time): 07/12/2019 9:32:06 PM swollen from wrist to shoulder. Referrals GO TO FACILITY OTHER - SPECIFY

## 2019-07-14 NOTE — Addendum Note (Signed)
Addended by: Loura Pardon A on: 07/14/2019 01:29 PM   Modules accepted: Orders

## 2019-07-14 NOTE — Addendum Note (Signed)
Addended by: Kris Mouton on: 07/14/2019 11:55 AM   Modules accepted: Orders

## 2019-07-14 NOTE — Telephone Encounter (Addendum)
Spoke with patient. She went to [facility] in [place]. She was checked for blood clot and everything was negative/normal. Patient states she has been using ice and taking Aleve. She is still out of town and will be back tomorrow and will call back to schedule an appointment with Dr Lorelei Pont for injection. Patient would like to get her Naproxen refilled so she can use it for the pain. Pharmacy [facility] in [place] as in the chart.

## 2019-07-16 ENCOUNTER — Ambulatory Visit: Payer: BC Managed Care – PPO | Admitting: Family Medicine

## 2019-07-16 ENCOUNTER — Ambulatory Visit: Payer: BC Managed Care – PPO

## 2019-07-16 ENCOUNTER — Encounter: Payer: Self-pay | Admitting: Family Medicine

## 2019-07-16 ENCOUNTER — Other Ambulatory Visit: Payer: Self-pay

## 2019-07-16 VITALS — BP 120/72 | HR 75 | Temp 98.2°F | Ht 69.0 in | Wt 288.5 lb

## 2019-07-16 DIAGNOSIS — M7592 Shoulder lesion, unspecified, left shoulder: Secondary | ICD-10-CM

## 2019-07-16 DIAGNOSIS — M7502 Adhesive capsulitis of left shoulder: Secondary | ICD-10-CM | POA: Diagnosis not present

## 2019-07-16 MED ORDER — METHYLPREDNISOLONE ACETATE 40 MG/ML IJ SUSP
80.0000 mg | Freq: Once | INTRAMUSCULAR | Status: AC
  Administered 2019-07-16: 12:00:00 80 mg via INTRA_ARTICULAR

## 2019-07-16 NOTE — Progress Notes (Signed)
Julie Davidow T. Ardith Lewman, MD Primary Care and Sports Medicine [facility] at [facility] [address] [place], [address] Phone: [phone]  FAX: [phone]  [name] - 53 y.o. female  MRN [id number]  Date of Birth: 1965-12-28  Visit Date: 07/16/2019  PCP: Judy Pimple, MD  Referred by: Tower, Audrie Gallus, MD  Chief Complaint  Patient presents with  . Follow-up    Left Shoulder   Subjective:   [name] is a 53 y.o. very pleasant female patient who presents with the following: shoulder pain  The patient noted above presents with shoulder pain that has been ongoing for 7 mo. there is no history of trauma or accident. The patient denies neck pain or radicular symptoms. No shoulder blade pain Denies dislocation, subluxation, separation of the shoulder. The patient does complain of pain with flexion, abduction, and terminal motion.  Significant restriction of motion. she describes a deep ache around the shoulder, and sometimes it will wake the patient up at night.  F/u L shoulder and f/u MRI 7 mo L shoulder injection  Medications Tried: OTC and nsaids Ice or Heat: minimal help Tried PT: No  Prior shoulder Injury: No Prior surgery: No Prior fracture: No  Past Medical History, Surgical History, Social History, Family History, Medications, and allergies reviewed and updated if relevant.   Patient Active Problem List   Diagnosis Date Noted  . Shoulder pain, left 06/05/2019  . Morbid obesity (HCC) 05/28/2019  . Hypertension, essential 05/28/2019  . Fever 03/31/2019  . 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 Multiple joint pain 07/02/2018  . Foot pain, bilateral 07/02/2018  . Fibromyalgia 05/20/2018  . Low back pain 02/26/2018    Past Medical History:  Diagnosis Date  . Concussion 2008  . Hx of migraines   . Viral meningitis     History reviewed. No pertinent surgical history.  Social History   Socioeconomic History  . Marital  status: Married    Spouse name: Not on file  . Number of children: Not on file  . Years of education: Not on file  . Highest education level: Not on file  Occupational History  . Occupation: [employer] - in [employer]  Social Needs  . Financial resource strain: Not on file  . Food insecurity    Worry: Not on file    Inability: Not on file  . Transportation needs    Medical: Not on file    Non-medical: Not on file  Tobacco Use  . Smoking status: Former Smoker    Packs/day: 0.25    Years: 3.00    Pack years: 0.75    Types: Cigarettes    Quit date: 08/25/2013    Years since quitting: 5.8  . Smokeless tobacco: Never Used  . Tobacco comment: no smoking in 5 days  Substance and Sexual Activity  . Alcohol use: Yes    Alcohol/week: 1.0 standard drinks    Types: 1 Cans of beer per week    Comment: occ  . Drug use: No  . Sexual activity: Not on file  Lifestyle  . Physical activity    Days per week: Not on file    Minutes per session: Not on file  . Stress: Not on file  Relationships  . Social Musician on phone: Not on file    Gets together: Not on file    Attends religious service: Not on file    Active member of  club or organization: Not on file    Attends meetings of clubs or organizations: Not on file    Relationship status: Not on file  . Intimate partner violence    Fear of current or ex partner: Not on file    Emotionally abused: Not on file    Physically abused: Not on file    Forced sexual activity: Not on file  Other Topics Concern  . Not on file  Social History Narrative  . Not on file    History reviewed. No pertinent family history.  Allergies  Allergen Reactions  . Vancomycin Rash    Red man reaction   Medication list reviewed and updated in full in [facility].  GEN: No fevers, chills. Nontoxic. Primarily MSK c/o today. MSK: Detailed in the HPI GI: tolerating PO intake without difficulty Neuro: No numbness, parasthesias,  or tingling associated. Otherwise the pertinent positives of the ROS are noted above.    Objective:   Blood pressure 120/72, pulse 75, temperature 98.2 F (36.8 C), temperature source Temporal, height 5\' 9"  (1.753 m), weight 288 lb 8 oz (130.9 kg), SpO2 96 %.   GEN: WDWN, NAD, Non-toxic, Alert & Oriented x 3 HEENT: Atraumatic, Normocephalic.  Ears and Nose: No external deformity. EXTR: No clubbing/cyanosis/edema NEURO: Normal gait.  PSYCH: Normally interactive. Conversant. Not depressed or anxious appearing.  Calm demeanor.   Shoulder: R and L Inspection: No muscle wasting or winging Ecchymosis/edema: neg  AC joint, scapula, clavicle: NT Cervical spine: NT, full ROM Spurling's: neg ABNORMAL SIDE TESTED: L UNLESS OTHERWISE NOTED, THE CONTRALATERAL SIDE HAS FULL RANGE OF MOTION. Abduction: 5/5, LIMITED TO 110 DEGREES Flexion: 5/5, LIMITED TO 105 DEGNO ROM  IR, lift-off: 5/5. TESTED AT 90 DEGREES OF ABDUCTION, LIMITED TO 0 DEGREES ER at neutral:  5/5, TESTED AT 90 DEGREES OF ABDUCTION, LIMITED TO 0 DEGREES AC crossover and compression: PAIN Drop Test: neg Empty Can: neg Supraspinatus insertion: NT Bicipital groove: NT ALL OTHER SPECIAL TESTING EQUIVOCAL GIVEN LOSS OF MOTION C5-T1 intact Sensation intact Grip 5/5   Assessment and Plan:     ICD-10-CM   1. Adhesive capsulitis of left shoulder  M75.02 methylPREDNISolone acetate (DEPO-MEDROL) injection 80 mg  2. Supraspinatus tendinitis, left  M75.92    >25 minutes spent in face to face time with patient, >50% spent in counselling or coordination of care  Patient was given a systematic ROM protocol from [employer] to be done daily. Emphasized importance of adherence, help of PT, daily HEP.  The average length of total symptoms is 12-18 months going through 3 different phases in the freezing and thawing process. Reviewed all with patient.   Tylenol or NSAID of choice prn for pain relief Intraarticular shoulder injections  discussed with patient, which have good evidence for accelerating the thawing phase.  Patient will be sent for formal PT for aggressive frozen shoulder ROM when ROM improving. Will need RTC str and scapular stabilization to fix underlying mechanics.  Intraarticular Shoulder Aspiration/Injection Procedure Note [name] 04-02-1966 Date of procedure: 07/16/2019  Procedure: Large Joint Aspiration / Injection of Shoulder, Intraarticular, LEFT Indications: Pain  Procedure Details Verbal consent was obtained from the patient. Risks including infection explained and contrasted with benefits and alternatives. Patient prepped with Chloraprep and Ethyl Chloride used for anesthesia. An intraarticular shoulder injection was performed using the posterior approach; needle placed into joint capsule without difficulty. The patient tolerated the procedure well and had decreased pain post injection. No complications. Injection: 8 cc of  Lidocaine 1% and 2 mL Depo-Medrol 40 mg. Needle: 21 gauge, 2 inch   Follow-up: Return in about 6 weeks (around 08/27/2019).  Meds ordered this encounter  Medications  . methylPREDNISolone acetate (DEPO-MEDROL) injection 80 mg   No orders of the defined types were placed in this encounter.   Signed,  Maud Deed. Latanya Hemmer, MD   Patient's Medications  New Prescriptions   No medications on file  Previous Medications   NAPROXEN (NAPROSYN) 500 MG TABLET    Take 1 tablet (500 mg total) by mouth 2 (two) times daily with a meal. As needed for pain  Modified Medications   No medications on file  Discontinued Medications   TRAMADOL (ULTRAM) 50 MG TABLET    Take 50 mg by mouth every 6 (six) hours as needed.

## 2019-07-18 ENCOUNTER — Other Ambulatory Visit: Payer: BC Managed Care – PPO

## 2019-08-06 ENCOUNTER — Other Ambulatory Visit: Payer: BC Managed Care – PPO

## 2019-08-28 ENCOUNTER — Ambulatory Visit (INDEPENDENT_AMBULATORY_CARE_PROVIDER_SITE_OTHER): Payer: BC Managed Care – PPO | Admitting: Family Medicine

## 2019-08-28 ENCOUNTER — Encounter: Payer: Self-pay | Admitting: Family Medicine

## 2019-08-28 ENCOUNTER — Other Ambulatory Visit: Payer: Self-pay

## 2019-08-28 VITALS — BP 126/78 | HR 78 | Temp 97.6°F | Ht 69.0 in | Wt 279.2 lb

## 2019-08-28 DIAGNOSIS — Z0000 Encounter for general adult medical examination without abnormal findings: Secondary | ICD-10-CM | POA: Diagnosis not present

## 2019-08-28 DIAGNOSIS — Z1231 Encounter for screening mammogram for malignant neoplasm of breast: Secondary | ICD-10-CM | POA: Diagnosis not present

## 2019-08-28 DIAGNOSIS — M2550 Pain in unspecified joint: Secondary | ICD-10-CM

## 2019-08-28 DIAGNOSIS — I10 Essential (primary) hypertension: Secondary | ICD-10-CM | POA: Diagnosis not present

## 2019-08-28 DIAGNOSIS — M797 Fibromyalgia: Secondary | ICD-10-CM

## 2019-08-28 NOTE — Assessment & Plan Note (Signed)
Followed by rheumatology with a f/u upcoming  Also some joint problems-possibly auto immune

## 2019-08-28 NOTE — Assessment & Plan Note (Signed)
bp in fair control at this time  BP Readings from Last 1 Encounters:  08/28/19 126/78   No changes needed-no longer needs medication Most recent labs reviewed  Disc lifstyle change with low sodium diet and exercise

## 2019-08-28 NOTE — Patient Instructions (Addendum)
Follow up with your gyn   Find an approximate date of your Tdap vaccine and let us know   Please do ifob kit for colon cancer screening   Stop at check out for mammogram appt   Keep up the good work with diet and exercise

## 2019-08-28 NOTE — Assessment & Plan Note (Signed)
Mammogram ordered She will f/u with gyn for annual exam Enc self breast exams

## 2019-08-28 NOTE — Assessment & Plan Note (Signed)
Seeing rheumatology

## 2019-08-28 NOTE — Assessment & Plan Note (Signed)
Commended on 20 lb wt loss so far with the opti program  Also dong well with exercise Enc her to keep up the good work

## 2019-08-28 NOTE — Assessment & Plan Note (Signed)
Reviewed health habits including diet and exercise and skin cancer prevention Reviewed appropriate screening tests for age  Also reviewed health mt list, fam hx and immunization status , as well as social and family history   See HPI Labs reviewed Commended wt loss Sent for last gyn pap report -pt plans to return for annual exam  Mammogram referral done  Pt cannot take flu vaccine based on previous reaction

## 2019-08-28 NOTE — Progress Notes (Signed)
Subjective:    Patient ID: [name], female    DOB: [birth year], 53 y.o.   MRN: [id number]  HPI  Here for health maintenance exam and to review chronic medical problems   Wt Readings from Last 3 Encounters:  08/28/19 279 lb 4 oz (126.7 kg)  07/16/19 288 lb 8 oz (130.9 kg)  06/18/19 287 lb 4 oz (130.3 kg)  wt is down (20 lb by her scale) Has inc her water  Felt better  Doing opti via program (around 1000 cal per day) - their food and lean protein and greens  Keeps her away from the drive thru ([employer]) - and does not have to prep much Exercise- elliptical and bike and walking  41.24 kg/m   Pap - has been 2 years -getting caught up  She goes to gyn Dr Cherylann Banas  Menstrual status - no period in over a year  No menopausal symptoms   Td 4/08 Thinks she had a T[place]ap since then - in [place]  Flu vaccine -declines  Every time she has one - they make her sick   Mammogram -she has not had one in 2 y  Has been to [facility] No lumps on self exam   Colon cancer screening - not ready for a colonoscopy  Will do ifob kit   HIV screen -not interested In past tested neg   Hypertension -looking good!  bp is stable today  No cp or palpitations or headaches or edema  No treatment at all  BP Readings from Last 3 Encounters:  08/28/19 126/78  07/16/19 120/72  06/18/19 (!) 150/70     She was diagnosed with fibromyalgia and reactive arthritis Sees [facility] - unsure if she has autoimmune dz at this point  Has had joint erosion on xr in the past  Has f/u in 2 weeks  fx a toe  Hurt her shoulder -frozen shoulder /had cortisone shot that helped some   Taking some supplements- has better energy  "fibro energy" by [employer]  Has vitamins and herbs-incl co enz Q and ginsing   Labs in July Lab Results  Component Value Date   CREATININE 0.92 05/28/2019   BUN 28 (H) 05/28/2019   NA 139 05/28/2019   K 4.9 05/28/2019   CL 102 05/28/2019   CO2 30 05/28/2019   Lab Results  Component Value Date    ALT 27 05/28/2019   AST 24 05/28/2019   ALKPHOS 65 05/28/2019   BILITOT 0.3 05/28/2019    Glucose 101 -non- fasting   Lab Results  Component Value Date   WBC 7.8 05/28/2019   HGB 13.1 05/28/2019   HCT 40.6 05/28/2019   MCV 84.2 05/28/2019   PLT 289.0 05/28/2019   Lab Results  Component Value Date   TSH 2.09 05/28/2019    Cholesterol Lab Results  Component Value Date   CHOL 185 05/28/2019   Lab Results  Component Value Date   HDL 51.30 05/28/2019   No results found for: [facility] Lab Results  Component Value Date   TRIG 247.0 (H) 05/28/2019   Lab Results  Component Value Date   CHOLHDL 4 05/28/2019   Lab Results  Component Value Date   LDLDIRECT 121.0 05/28/2019   Not fasting -glucose and triglycerides are high  LDL fair - will continue to work on diet   Patient Active Problem List   Diagnosis Date Noted  . Screening mammogram, encounter for 08/28/2019  . Routine general medical examination at a  health care facility 08/28/2019  . Shoulder pain, left 06/05/2019  . Morbid obesity ([place]) 05/28/2019  . Hypertension, essential 05/28/2019  . 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 Multiple joint pain 07/02/2018  . Foot pain, bilateral 07/02/2018  . Fibromyalgia 05/20/2018  . Low back pain 02/26/2018   Past Medical History:  Diagnosis Date  . Concussion 2008  . Hx of migraines   . Viral meningitis    History reviewed. No pertinent surgical history. Social History   Tobacco Use  . Smoking status: Former Smoker    Packs/day: 0.25    Years: 3.00    Pack years: 0.75    Types: Cigarettes    Quit date: 08/25/2013    Years since quitting: 6.0  . Smokeless tobacco: Never Used  . Tobacco comment: no smoking in 5 days  Substance Use Topics  . Alcohol use: Yes    Alcohol/week: 1.0 standard drinks    Types: 1 Cans of beer per week    Comment: occ  . Drug use: No   History reviewed. No pertinent family history. Allergies  Allergen Reactions  . Influenza A (H1n1)  Monovalent Vaccine     Fever and aches and flu symptoms and n/v  . Vancomycin Rash    Red man reaction   Current Outpatient Medications on File Prior to Visit  Medication Sig Dispense Refill  . naproxen (NAPROSYN) 500 MG tablet Take 1 tablet (500 mg total) by mouth 2 (two) times daily with a meal. As needed for pain 60 tablet 1   No current facility-administered medications on file prior to visit.      Review of Systems  Constitutional: Negative for activity change, appetite change, fatigue, fever and unexpected weight change.  HENT: Negative for congestion, ear pain, rhinorrhea, sinus pressure and sore throat.   Eyes: Negative for pain, redness and visual disturbance.  Respiratory: Negative for cough, shortness of breath and wheezing.   Cardiovascular: Negative for chest pain and palpitations.  Gastrointestinal: Negative for abdominal pain, blood in stool, constipation and diarrhea.  Endocrine: Negative for polydipsia and polyuria.  Genitourinary: Negative for dysuria, frequency and urgency.  Musculoskeletal: Positive for arthralgias and joint swelling. Negative for back pain and myalgias.  Skin: Negative for pallor and rash.  Allergic/Immunologic: Negative for environmental allergies.  Neurological: Negative for dizziness, syncope and headaches.  Hematological: Negative for adenopathy. Does not bruise/bleed easily.  Psychiatric/Behavioral: Negative for decreased concentration and dysphoric mood. The patient is not nervous/anxious.        Objective:   Physical Exam Constitutional:      General: She is not in acute distress.    Appearance: Normal appearance. She is well-developed. She is obese. She is not ill-appearing or diaphoretic.  HENT:     Head: Normocephalic and atraumatic.     Right Ear: Tympanic membrane, ear canal and external ear normal.     Left Ear: Tympanic membrane, ear canal and external ear normal.     Nose: Nose normal. No congestion.     Mouth/Throat:      Mouth: Mucous membranes are moist.     Pharynx: Oropharynx is clear. No posterior oropharyngeal erythema.  Eyes:     General: No scleral icterus.       Right eye: No discharge.        Left eye: No discharge.     Extraocular Movements: Extraocular movements intact.     Conjunctiva/sclera: Conjunctivae normal.     Pupils: Pupils are equal, round, and reactive to light.  Neck:     Musculoskeletal: Normal range of motion and neck supple. No neck rigidity or muscular tenderness.     Thyroid: No thyromegaly.     Vascular: No carotid bruit or JVD.  Cardiovascular:     Rate and Rhythm: Normal rate and regular rhythm.     Pulses: Normal pulses.     Heart sounds: Normal heart sounds. No gallop.   Pulmonary:     Effort: Pulmonary effort is normal. No respiratory distress.     Breath sounds: Normal breath sounds. No wheezing or rales.     Comments: Good air exch Chest:     Chest wall: No tenderness.  Abdominal:     General: Bowel sounds are normal. There is no distension or abdominal bruit.     Palpations: Abdomen is soft. There is no mass.     Tenderness: There is no abdominal tenderness.     Hernia: No hernia is present.  Genitourinary:    Comments: Breast and pelvic exams done by gyn Musculoskeletal: Normal range of motion.        General: No tenderness.     Right lower leg: No edema.     Left lower leg: No edema.  Lymphadenopathy:     Cervical: No cervical adenopathy.  Skin:    General: Skin is warm and dry.     Coloration: Skin is not pale.     Findings: No erythema or rash.     Comments: Tan SKs diffusely on trunk  Neurological:     Mental Status: She is alert. Mental status is at baseline.     Cranial Nerves: No cranial nerve deficit.     Motor: No abnormal muscle tone.     Coordination: Coordination normal.     Gait: Gait normal.     Deep Tendon Reflexes: Reflexes are normal and symmetric. Reflexes normal.  Psychiatric:        Mood and Affect: Mood normal.     Comments:  Pleasant            Assessment & Plan:   Problem List Items Addressed This Visit      Cardiovascular and Mediastinum   Hypertension, essential    bp in fair control at this time  BP Readings from Last 1 Encounters:  08/28/19 126/78   No changes needed-no longer needs medication Most recent labs reviewed  Disc lifstyle change with low sodium diet and exercise          Other   Multiple joint pain    Seeing rheumatology      Morbid obesity ([place])    Commended on 20 lb wt loss so far with the opti program  Also dong well with exercise Enc her to keep up the good work      Fibromyalgia    Followed by rheumatology with a f/u upcoming  Also some joint problems-possibly auto immune       Screening mammogram, encounter for    Mammogram ordered She will f/u with gyn for annual exam Enc self breast exams      Relevant Orders   MM 3D SCREEN BREAST BILATERAL   Routine general medical examination at a health care facility - Primary    Reviewed health habits including diet and exercise and skin cancer prevention Reviewed appropriate screening tests for age  Also reviewed health mt list, fam hx and immunization status , as well as social and family history   See HPI Labs reviewed Commended wt loss Sent  for last gyn pap report -pt plans to return for annual exam  Mammogram referral done  Pt cannot take flu vaccine based on previous reaction

## 2019-09-15 DIAGNOSIS — M0230 Reiter's disease, unspecified site: Secondary | ICD-10-CM | POA: Diagnosis not present

## 2019-09-15 DIAGNOSIS — M797 Fibromyalgia: Secondary | ICD-10-CM | POA: Diagnosis not present

## 2019-09-25 ENCOUNTER — Ambulatory Visit
Admission: RE | Admit: 2019-09-25 | Discharge: 2019-09-25 | Disposition: A | Discharge status: Home / Self Care | Payer: BC Managed Care – PPO | Source: Ambulatory Visit | Attending: Family Medicine | Admitting: Family Medicine

## 2019-09-25 ENCOUNTER — Other Ambulatory Visit: Payer: Self-pay

## 2019-09-25 DIAGNOSIS — Z1231 Encounter for screening mammogram for malignant neoplasm of breast: Secondary | ICD-10-CM | POA: Insufficient documentation

## 2019-10-02 ENCOUNTER — Telehealth: Payer: Self-pay

## 2019-10-02 ENCOUNTER — Observation Stay: Payer: BC Managed Care – PPO | Admitting: Anesthesiology

## 2019-10-02 ENCOUNTER — Observation Stay
Admission: EM | Admit: 2019-10-02 | Discharge: 2019-10-03 | Disposition: A | Discharge status: Home / Self Care | Payer: BC Managed Care – PPO | Attending: Surgery | Admitting: Surgery

## 2019-10-02 ENCOUNTER — Encounter: Payer: Self-pay | Admitting: Emergency Medicine

## 2019-10-02 ENCOUNTER — Encounter
Admission: EM | Disposition: A | Discharge status: Home / Self Care | Payer: Self-pay | Source: Home / Self Care | Attending: Emergency Medicine

## 2019-10-02 ENCOUNTER — Other Ambulatory Visit: Payer: Self-pay

## 2019-10-02 ENCOUNTER — Emergency Department: Payer: BC Managed Care – PPO

## 2019-10-02 DIAGNOSIS — M25512 Pain in left shoulder: Secondary | ICD-10-CM | POA: Insufficient documentation

## 2019-10-02 DIAGNOSIS — K3580 Unspecified acute appendicitis: Secondary | ICD-10-CM | POA: Diagnosis present

## 2019-10-02 DIAGNOSIS — Z881 Allergy status to other antibiotic agents status: Secondary | ICD-10-CM | POA: Insufficient documentation

## 2019-10-02 DIAGNOSIS — Z6841 Body Mass Index (BMI) 40.0 and over, adult: Secondary | ICD-10-CM | POA: Insufficient documentation

## 2019-10-02 DIAGNOSIS — Z87891 Personal history of nicotine dependence: Secondary | ICD-10-CM | POA: Insufficient documentation

## 2019-10-02 DIAGNOSIS — Z20828 Contact with and (suspected) exposure to other viral communicable diseases: Secondary | ICD-10-CM | POA: Insufficient documentation

## 2019-10-02 DIAGNOSIS — M545 Low back pain: Secondary | ICD-10-CM | POA: Insufficient documentation

## 2019-10-02 DIAGNOSIS — K35891 Other acute appendicitis without perforation, with gangrene: Principal | ICD-10-CM | POA: Insufficient documentation

## 2019-10-02 DIAGNOSIS — Z791 Long term (current) use of non-steroidal anti-inflammatories (NSAID): Secondary | ICD-10-CM | POA: Insufficient documentation

## 2019-10-02 DIAGNOSIS — Z8661 Personal history of infections of the central nervous system: Secondary | ICD-10-CM | POA: Insufficient documentation

## 2019-10-02 DIAGNOSIS — Z887 Allergy status to serum and vaccine status: Secondary | ICD-10-CM | POA: Diagnosis not present

## 2019-10-02 DIAGNOSIS — Z03818 Encounter for observation for suspected exposure to other biological agents ruled out: Secondary | ICD-10-CM | POA: Diagnosis not present

## 2019-10-02 DIAGNOSIS — K429 Umbilical hernia without obstruction or gangrene: Secondary | ICD-10-CM

## 2019-10-02 DIAGNOSIS — I10 Essential (primary) hypertension: Secondary | ICD-10-CM | POA: Insufficient documentation

## 2019-10-02 DIAGNOSIS — K37 Unspecified appendicitis: Secondary | ICD-10-CM | POA: Diagnosis not present

## 2019-10-02 DIAGNOSIS — K3531 Acute appendicitis with localized peritonitis and gangrene, without perforation: Secondary | ICD-10-CM | POA: Diagnosis not present

## 2019-10-02 DIAGNOSIS — M2559 Pain in other specified joint: Secondary | ICD-10-CM | POA: Insufficient documentation

## 2019-10-02 DIAGNOSIS — M797 Fibromyalgia: Secondary | ICD-10-CM | POA: Insufficient documentation

## 2019-10-02 DIAGNOSIS — R109 Unspecified abdominal pain: Secondary | ICD-10-CM | POA: Diagnosis not present

## 2019-10-02 HISTORY — PX: LAPAROSCOPIC APPENDECTOMY: SHX408

## 2019-10-02 LAB — URINALYSIS, COMPLETE (UACMP) WITH MICROSCOPIC
Bilirubin Urine: NEGATIVE
Glucose, UA: NEGATIVE mg/dL
Hgb urine dipstick: NEGATIVE
Ketones, ur: NEGATIVE mg/dL
Leukocytes,Ua: NEGATIVE
Nitrite: NEGATIVE
Protein, ur: NEGATIVE mg/dL
Specific Gravity, Urine: 1.026 (ref 1.005–1.030)
pH: 5 (ref 5.0–8.0)

## 2019-10-02 LAB — COMPREHENSIVE METABOLIC PANEL
ALT: 23 U/L (ref 0–44)
AST: 17 U/L (ref 15–41)
Albumin: 4.1 g/dL (ref 3.5–5.0)
Alkaline Phosphatase: 66 U/L (ref 38–126)
Anion gap: 9 (ref 5–15)
BUN: 17 mg/dL (ref 6–20)
CO2: 24 mmol/L (ref 22–32)
Calcium: 8.9 mg/dL (ref 8.9–10.3)
Chloride: 106 mmol/L (ref 98–111)
Creatinine, Ser: 0.68 mg/dL (ref 0.44–1.00)
GFR calc Af Amer: >60 mL/min (ref >60)
GFR calc non Af Amer: >60 mL/min (ref >60)
Glucose, Bld: 119 mg/dL — ABNORMAL HIGH (ref 70–99)
Potassium: 4 mmol/L (ref 3.5–5.1)
Sodium: 139 mmol/L (ref 135–145)
Total Bilirubin: 0.7 mg/dL (ref 0.3–1.2)
Total Protein: 7.6 g/dL (ref 6.5–8.1)

## 2019-10-02 LAB — CBC
HCT: 41.8 % (ref 36.0–46.0)
Hemoglobin: 13.6 g/dL (ref 12.0–15.0)
MCH: 27.4 pg (ref 26.0–34.0)
MCHC: 32.5 g/dL (ref 30.0–36.0)
MCV: 84.1 fL (ref 80.0–100.0)
Platelets: 285 10*3/uL (ref 150–400)
RBC: 4.97 MIL/uL (ref 3.87–5.11)
RDW: 14 % (ref 11.5–15.5)
WBC: 10.3 10*3/uL (ref 4.0–10.5)
nRBC: 0 % (ref 0.0–0.2)

## 2019-10-02 LAB — LIPASE, BLOOD: Lipase: 19 U/L (ref 11–51)

## 2019-10-02 LAB — SARS CORONAVIRUS 2 BY RT PCR (HOSPITAL ORDER, PERFORMED IN ~~LOC~~ HOSPITAL LAB): SARS Coronavirus 2: NEGATIVE

## 2019-10-02 SURGERY — APPENDECTOMY, LAPAROSCOPIC
Anesthesia: General

## 2019-10-02 MED ORDER — ONDANSETRON 4 MG PO TBDP: 4.0000 mg | ORAL_TABLET | Freq: Four times a day (QID) | ORAL | Status: DC | PRN

## 2019-10-02 MED ORDER — FENTANYL CITRATE (PF) 100 MCG/2ML IJ SOLN
INTRAMUSCULAR | Status: AC
  Filled 2019-10-02: qty 2

## 2019-10-02 MED ORDER — MIDAZOLAM HCL 2 MG/2ML IJ SOLN
INTRAMUSCULAR | Status: AC
  Filled 2019-10-02: qty 2

## 2019-10-02 MED ORDER — FENTANYL CITRATE (PF) 100 MCG/2ML IJ SOLN
INTRAMUSCULAR | Status: AC
  Administered 2019-10-02: 25 ug via INTRAVENOUS
  Filled 2019-10-02: qty 2

## 2019-10-02 MED ORDER — PHENYLEPHRINE HCL (PRESSORS) 10 MG/ML IV SOLN
INTRAVENOUS | Status: DC | PRN
  Administered 2019-10-02: 100 ug via INTRAVENOUS

## 2019-10-02 MED ORDER — PANTOPRAZOLE SODIUM 40 MG IV SOLR
40.0000 mg | Freq: Every day | INTRAVENOUS | Status: DC
  Administered 2019-10-02: 40 mg via INTRAVENOUS
  Filled 2019-10-02 (×2): qty 40

## 2019-10-02 MED ORDER — FENTANYL CITRATE (PF) 100 MCG/2ML IJ SOLN
25.0000 ug | INTRAMUSCULAR | Status: DC | PRN
  Administered 2019-10-02 (×4): 25 ug via INTRAVENOUS

## 2019-10-02 MED ORDER — ONDANSETRON HCL 4 MG/2ML IJ SOLN: 4.0000 mg | Freq: Four times a day (QID) | INTRAMUSCULAR | Status: DC | PRN

## 2019-10-02 MED ORDER — MIDAZOLAM HCL 2 MG/2ML IJ SOLN
INTRAMUSCULAR | Status: DC | PRN
  Administered 2019-10-02: 2 mg via INTRAVENOUS

## 2019-10-02 MED ORDER — SODIUM CHLORIDE 0.9 % IV SOLN
INTRAVENOUS | Status: DC | PRN
  Administered 2019-10-02: 250 mL via INTRAVENOUS

## 2019-10-02 MED ORDER — POLYETHYLENE GLYCOL 3350 17 G PO PACK
17.0000 g | PACK | Freq: Every day | ORAL | Status: DC | PRN
  Filled 2019-10-02: qty 1

## 2019-10-02 MED ORDER — SUCCINYLCHOLINE CHLORIDE 20 MG/ML IJ SOLN
INTRAMUSCULAR | Status: DC | PRN
  Administered 2019-10-02: 120 mg via INTRAVENOUS

## 2019-10-02 MED ORDER — BUPIVACAINE-EPINEPHRINE (PF) 0.5% -1:200000 IJ SOLN
INTRAMUSCULAR | Status: DC | PRN
  Administered 2019-10-02: 30 mL

## 2019-10-02 MED ORDER — LIDOCAINE HCL (CARDIAC) PF 100 MG/5ML IV SOSY
PREFILLED_SYRINGE | INTRAVENOUS | Status: DC | PRN
  Administered 2019-10-02: 80 mg via INTRAVENOUS

## 2019-10-02 MED ORDER — IOHEXOL 300 MG/ML  SOLN
100.0000 mL | Freq: Once | INTRAMUSCULAR | Status: AC | PRN
  Administered 2019-10-02: 100 mL via INTRAVENOUS

## 2019-10-02 MED ORDER — ROCURONIUM BROMIDE 100 MG/10ML IV SOLN
INTRAVENOUS | Status: DC | PRN
  Administered 2019-10-02: 10 mg via INTRAVENOUS
  Administered 2019-10-02: 35 mg via INTRAVENOUS
  Administered 2019-10-02: 5 mg via INTRAVENOUS

## 2019-10-02 MED ORDER — MORPHINE SULFATE (PF) 4 MG/ML IV SOLN
4.0000 mg | Freq: Once | INTRAVENOUS | Status: AC
  Administered 2019-10-02: 4 mg via INTRAVENOUS
  Filled 2019-10-02: qty 1

## 2019-10-02 MED ORDER — LACTATED RINGERS IV SOLN
INTRAVENOUS | Status: DC
  Administered 2019-10-02: 50 mL/h via INTRAVENOUS

## 2019-10-02 MED ORDER — DEXAMETHASONE SODIUM PHOSPHATE 10 MG/ML IJ SOLN
INTRAMUSCULAR | Status: DC | PRN
  Administered 2019-10-02: 10 mg via INTRAVENOUS

## 2019-10-02 MED ORDER — KETOROLAC TROMETHAMINE 30 MG/ML IJ SOLN
INTRAMUSCULAR | Status: AC
  Filled 2019-10-02: qty 1

## 2019-10-02 MED ORDER — OXYCODONE HCL 5 MG PO TABS: 5.0000 mg | ORAL_TABLET | Freq: Once | ORAL | Status: DC | PRN

## 2019-10-02 MED ORDER — ONDANSETRON HCL 4 MG/2ML IJ SOLN
4.0000 mg | Freq: Once | INTRAMUSCULAR | Status: AC
  Administered 2019-10-02: 4 mg via INTRAVENOUS
  Filled 2019-10-02: qty 2

## 2019-10-02 MED ORDER — LACTATED RINGERS IV BOLUS
1000.0000 mL | Freq: Once | INTRAVENOUS | Status: AC
  Administered 2019-10-02: 1000 mL via INTRAVENOUS

## 2019-10-02 MED ORDER — PROPOFOL 10 MG/ML IV BOLUS
INTRAVENOUS | Status: DC | PRN
  Administered 2019-10-02: 170 mg via INTRAVENOUS

## 2019-10-02 MED ORDER — PIPERACILLIN-TAZOBACTAM 3.375 G IVPB 30 MIN
3.3750 g | Freq: Once | INTRAVENOUS | Status: AC
  Administered 2019-10-02: 3.375 g via INTRAVENOUS
  Filled 2019-10-02: qty 50

## 2019-10-02 MED ORDER — OXYCODONE HCL 5 MG PO TABS
5.0000 mg | ORAL_TABLET | ORAL | Status: DC | PRN
  Administered 2019-10-03: 5 mg via ORAL
  Filled 2019-10-02: qty 1

## 2019-10-02 MED ORDER — HYDROMORPHONE HCL 1 MG/ML IJ SOLN: 0.5000 mg | INTRAMUSCULAR | Status: DC | PRN

## 2019-10-02 MED ORDER — ACETAMINOPHEN 10 MG/ML IV SOLN
INTRAVENOUS | Status: DC | PRN
  Administered 2019-10-02: 1000 mg via INTRAVENOUS

## 2019-10-02 MED ORDER — KETOROLAC TROMETHAMINE 30 MG/ML IJ SOLN
30.0000 mg | Freq: Four times a day (QID) | INTRAMUSCULAR | Status: DC
  Administered 2019-10-02 (×3): 30 mg via INTRAVENOUS
  Filled 2019-10-02 (×2): qty 1

## 2019-10-02 MED ORDER — PIPERACILLIN-TAZOBACTAM 3.375 G IVPB
3.3750 g | Freq: Three times a day (TID) | INTRAVENOUS | Status: DC
  Administered 2019-10-02 – 2019-10-03 (×3): 3.375 g via INTRAVENOUS
  Filled 2019-10-02 (×4): qty 50

## 2019-10-02 MED ORDER — FENTANYL CITRATE (PF) 100 MCG/2ML IJ SOLN
INTRAMUSCULAR | Status: DC | PRN
  Administered 2019-10-02 (×2): 100 ug via INTRAVENOUS

## 2019-10-02 MED ORDER — LACTATED RINGERS IV SOLN
125.0000 mL/h | INTRAVENOUS | Status: DC
  Administered 2019-10-02 – 2019-10-03 (×3): 125 mL/h via INTRAVENOUS

## 2019-10-02 MED ORDER — SUGAMMADEX SODIUM 500 MG/5ML IV SOLN
INTRAVENOUS | Status: AC
  Filled 2019-10-02: qty 5

## 2019-10-02 MED ORDER — ENOXAPARIN SODIUM 40 MG/0.4ML ~~LOC~~ SOLN
40.0000 mg | Freq: Two times a day (BID) | SUBCUTANEOUS | Status: DC
  Filled 2019-10-02 (×2): qty 0.4

## 2019-10-02 MED ORDER — OXYCODONE HCL 5 MG/5ML PO SOLN: 5.0000 mg | Freq: Once | ORAL | Status: DC | PRN

## 2019-10-02 MED ORDER — ACETAMINOPHEN 10 MG/ML IV SOLN
INTRAVENOUS | Status: AC
  Filled 2019-10-02: qty 100

## 2019-10-02 MED ORDER — SUGAMMADEX SODIUM 500 MG/5ML IV SOLN
INTRAVENOUS | Status: DC | PRN
  Administered 2019-10-02: 300 mg via INTRAVENOUS

## 2019-10-02 MED ORDER — ACETAMINOPHEN 500 MG PO TABS: 1000.0000 mg | ORAL_TABLET | Freq: Four times a day (QID) | ORAL | Status: DC | PRN

## 2019-10-02 SURGICAL SUPPLY — 55 items
ADH SKN CLS APL DERMABOND .7 (GAUZE/BANDAGES/DRESSINGS) ×1
APL PRP STRL LF DISP 70% ISPRP (MISCELLANEOUS) ×1
BAG SPEC RTRVL LRG 6X4 10 (ENDOMECHANICALS) ×1
BULB RESERV EVAC DRAIN JP 100C (MISCELLANEOUS) ×1 IMPLANT
CANISTER SUCT 1200ML W/VALVE (MISCELLANEOUS) ×2 IMPLANT
CHLORAPREP W/TINT 26 (MISCELLANEOUS) ×2 IMPLANT
COVER WAND RF STERILE (DRAPES) ×1 IMPLANT
CUTTER FLEX LINEAR 45M (STAPLE) IMPLANT
DERMABOND ADVANCED (GAUZE/BANDAGES/DRESSINGS) ×1
DERMABOND ADVANCED .7 DNX12 (GAUZE/BANDAGES/DRESSINGS) ×1 IMPLANT
DRAIN CHANNEL JP 19F (MISCELLANEOUS) ×1 IMPLANT
DRSG TEGADERM 2-3/8X2-3/4 SM (GAUZE/BANDAGES/DRESSINGS) ×1 IMPLANT
ELECT CAUTERY BLADE 6.4 (BLADE) ×2 IMPLANT
ELECT REM PT RETURN 9FT ADLT (ELECTROSURGICAL) ×2
ELECTRODE REM PT RTRN 9FT ADLT (ELECTROSURGICAL) ×1 IMPLANT
GLOVE SURG SYN 7.0 (GLOVE) ×6 IMPLANT
GLOVE SURG SYN 7.0 PF PI (GLOVE) ×1 IMPLANT
GLOVE SURG SYN 7.5  E (GLOVE) ×3
GLOVE SURG SYN 7.5 E (GLOVE) ×3 IMPLANT
GLOVE SURG SYN 7.5 PF PI (GLOVE) ×1 IMPLANT
GOWN STRL REUS W/ TWL LRG LVL3 (GOWN DISPOSABLE) ×2 IMPLANT
GOWN STRL REUS W/TWL LRG LVL3 (GOWN DISPOSABLE) ×6
IRRIGATION STRYKERFLOW (MISCELLANEOUS) IMPLANT
IRRIGATOR STRYKERFLOW (MISCELLANEOUS) ×2
IV NS 1000ML (IV SOLUTION) ×2
IV NS 1000ML BAXH (IV SOLUTION) ×1 IMPLANT
KIT TURNOVER KIT A (KITS) ×2 IMPLANT
LABEL OR SOLS (LABEL) ×2 IMPLANT
LIGASURE LAP MARYLAND 5MM 37CM (ELECTROSURGICAL) ×1 IMPLANT
NEEDLE HYPO 22GX1.5 SAFETY (NEEDLE) ×2 IMPLANT
NS IRRIG 500ML POUR BTL (IV SOLUTION) ×2 IMPLANT
PACK LAP CHOLECYSTECTOMY (MISCELLANEOUS) ×2 IMPLANT
PENCIL ELECTRO HAND CTR (MISCELLANEOUS) ×2 IMPLANT
POUCH SPECIMEN RETRIEVAL 10MM (ENDOMECHANICALS) ×2 IMPLANT
RELOAD 45 VASCULAR/THIN (ENDOMECHANICALS) IMPLANT
RELOAD STAPLE 45 2.5 WHT GRN (ENDOMECHANICALS) IMPLANT
RELOAD STAPLE 45 3.5 BLU ETS (ENDOMECHANICALS) ×1 IMPLANT
RELOAD STAPLE TA45 3.5 REG BLU (ENDOMECHANICALS) ×2 IMPLANT
SCISSORS METZENBAUM CVD 33 (INSTRUMENTS) ×2 IMPLANT
SLEEVE ADV FIXATION 5X100MM (TROCAR) ×4 IMPLANT
SPONGE VERSALON 4X4 4PLY (MISCELLANEOUS) ×1 IMPLANT
SUT ETHILON 3-0 FS-10 30 BLK (SUTURE) ×2
SUT MNCRL 4-0 (SUTURE) ×2
SUT MNCRL 4-0 27XMFL (SUTURE) ×1
SUT VIC AB 2-0 SH 27 (SUTURE) ×2
SUT VIC AB 2-0 SH 27XBRD (SUTURE) IMPLANT
SUT VIC AB 3-0 SH 27 (SUTURE) ×2
SUT VIC AB 3-0 SH 27X BRD (SUTURE) IMPLANT
SUT VICRYL 0 AB UR-6 (SUTURE) ×2 IMPLANT
SUTURE EHLN 3-0 FS-10 30 BLK (SUTURE) IMPLANT
SUTURE MNCRL 4-0 27XMF (SUTURE) ×1 IMPLANT
TRAY FOLEY MTR SLVR 16FR STAT (SET/KITS/TRAYS/PACK) ×2 IMPLANT
TROCAR BALLN GELPORT 12X130M (ENDOMECHANICALS) ×2 IMPLANT
TROCAR Z-THREAD OPTICAL 5X100M (TROCAR) ×2 IMPLANT
TUBING EVAC SMOKE HEATED PNEUM (TUBING) ×2 IMPLANT

## 2019-10-02 NOTE — H&P (Addendum)
[facility] SURGICAL HISTORY & PHYSICAL (cpt [phone])  HISTORY OF PRESENT ILLNESS (HPI):  53 y.o. female presented to [facility] ED today for abdominal pain. Patient reports about a 2 day history of diffuse abdominal pain which she described as crampy in nature. The pain has since worsened and become a sharp pain which has localized to the RLQ. Nothing provides relief from the pain. She endorse decreased appetite and nausea. No fever, chills, cough, congestion, SOB, CP, emesis, or bladder or bowel changes. No history of similar pain in the past. No previous abdominal surgeries. Work up in the ED was concerning for acute uncomplicated appendicitis on CT with normal WBC and no other appreciable lab abnormalities.   General surgery is consulted by emergency medicine physician Dr Blake Divine, MD for evaluation and management of acute uncomplicated appendicitis.    PAST MEDICAL HISTORY (PMH):  Past Medical History:  Diagnosis Date  . Concussion 2008  . Hx of migraines   . Viral meningitis     Reviewed. Otherwise negative.   PAST SURGICAL HISTORY ([place]):  History reviewed. No pertinent surgical history.  Reviewed. Otherwise negative.   MEDICATIONS:  Prior to Admission medications   Medication Sig Start Date End Date Taking? Authorizing Provider  naproxen (NAPROSYN) 500 MG tablet Take 1 tablet (500 mg total) by mouth 2 (two) times daily with a meal. As needed for pain 07/14/19   Tower, Wynelle Fanny, MD     ALLERGIES:  Allergies  Allergen Reactions  . Influenza A (H1n1) Monovalent Vaccine     Fever and aches and flu symptoms and n/v  . Vancomycin Rash    Red man reaction     SOCIAL HISTORY:  Social History   Socioeconomic History  . Marital status: Married    Spouse name: Not on file  . Number of children: Not on file  . Years of education: Not on file  . Highest education level: Not on file  Occupational History  . Occupation: [employer] - in [place]  . Financial resource strain: Not on file  . Food insecurity    Worry: Not on file    Inability: Not on file  . Transportation needs    Medical: Not on file    Non-medical: Not on file  Tobacco Use  . Smoking status: Former Smoker    Packs/day: 0.25    Years: 3.00    Pack years: 0.75    Types: Cigarettes    Quit date: 08/25/2013    Years since quitting: 6.1  . Smokeless tobacco: Never Used  . Tobacco comment: no smoking in 5 days  Substance and Sexual Activity  . Alcohol use: Yes    Alcohol/week: 1.0 standard drinks    Types: 1 Cans of beer per week    Comment: occ  . Drug use: No  . Sexual activity: Not on file  Lifestyle  . Physical activity    Days per week: Not on file    Minutes per session: Not on file  . Stress: Not on file  Relationships  . Social Herbalist on phone: Not on file    Gets together: Not on file    Attends religious service: Not on file    Active member of club or organization: Not on file    Attends meetings of clubs or organizations: Not on file    Relationship status: Not on file  . Intimate partner violence    Fear of current  or ex partner: Not on file    Emotionally abused: Not on file    Physically abused: Not on file    Forced sexual activity: Not on file  Other Topics Concern  . Not on file  Social History Narrative  . Not on file     FAMILY HISTORY:  Family History  Problem Relation Age of Onset  . Breast cancer Neg Hx     Otherwise negative.   REVIEW OF SYSTEMS:  Review of Systems  Constitutional: Negative for chills and fever.       + Decreased Appetite   HENT: Negative for congestion and sore throat.   Respiratory: Negative for cough and shortness of breath.   Cardiovascular: Negative for chest pain and palpitations.  Gastrointestinal: Positive for abdominal pain and nausea. Negative for constipation, diarrhea and vomiting.  Genitourinary: Negative for dysuria and urgency.  Neurological: Negative for  dizziness and headaches.  All other systems reviewed and are negative.   VITAL SIGNS:  Pulse Rate:  [78] 78 (11/12 0800) Resp:  [11] 11 (11/12 0800) BP: (130)/(81) 130/81 (11/12 0800) SpO2:  [95 %] 95 % (11/12 0800) Weight:  [124.7 kg] 124.7 kg (11/12 0723)     Height: 5\' 9"  (175.3 cm) Weight: 124.7 kg BMI (Calculated): 40.59   PHYSICAL EXAM:  Physical Exam Constitutional:      General: She is not in acute distress.    Appearance: She is well-developed. She is obese. She is not ill-appearing.  HENT:     Head: Normocephalic and atraumatic.  Eyes:     General: No scleral icterus.    Extraocular Movements: Extraocular movements intact.  Cardiovascular:     Rate and Rhythm: Normal rate and regular rhythm.     Heart sounds: Normal heart sounds. No murmur. No friction rub. No gallop.   Pulmonary:     Effort: Pulmonary effort is normal. No respiratory distress.     Breath sounds: Normal breath sounds. No wheezing or rhonchi.  Abdominal:     General: There is no distension.     Palpations: Abdomen is soft.     Tenderness: There is abdominal tenderness in the right lower quadrant. There is no guarding or rebound. Positive signs include McBurney's sign. Negative signs include Murphy's sign.  Genitourinary:    Comments: deferred Skin:    General: Skin is warm and dry.     Coloration: Skin is not jaundiced or pale.  Neurological:     General: No focal deficit present.     Mental Status: She is alert and oriented to person, place, and time.  Psychiatric:        Mood and Affect: Mood normal.        Behavior: Behavior normal.     INTAKE/OUTPUT:  This shift: No intake/output data recorded.  Last 2 shifts: @IOLAST2SHIFTS @  Labs:  CBC Latest Ref Rng & Units 10/02/2019 05/28/2019 07/02/2018  WBC 4.0 - 10.5 K/uL 10.3 7.8 7.4  Hemoglobin 12.0 - 15.0 g/dL 07/29/2019 07/04/2018 32.4  Hematocrit 36.0 - 46.0 % 41.8 40.6 40.9  Platelets 150 - 400 K/uL 285 289.0 284.0   CMP Latest Ref Rng & Units  10/02/2019 05/28/2019 12/18/2013  Glucose 70 - 99 mg/dL 07/29/2019) 12/20/2013) 97  BUN 6 - 20 mg/dL 17 253(G) 644(I)  Creatinine 0.44 - 1.00 mg/dL 34(V 42(V 9.56  Sodium 135 - 145 mmol/L 139 139 138  Potassium 3.5 - 5.1 mmol/L 4.0 4.9 4.7  Chloride 98 - 111 mmol/L 106 102 105  CO2 22 - 32 mmol/L 24 30 30   Calcium 8.9 - 10.3 mg/dL 8.9 9.7 9.6  Total Protein 6.5 - 8.1 g/dL 7.6 6.8 7.8  Total Bilirubin 0.3 - 1.2 mg/dL 0.7 0.3 0.2  Alkaline Phos 38 - 126 U/L 66 65 76  AST 15 - 41 U/L 17 24 27   ALT 0 - 44 U/L 23 27 24      Imaging studies:   CT Abdomen/Pelvis (10/02/2019) personally reviewed showing inflammatory response to RLQ consistent with appendicitis without abscess or perforation, and radiologist report reviewed:  IMPRESSION: 1. Acute appendicitis. No definite evidence of rupture. No periappendiceal abscess. Appendix lies in a retrocecal location. 2. No other acute abnormality.  Exam otherwise unremarkable.    Assessment/Plan: (ICD-10's: 60K35.80) [age] y.o. female with RLQ pain attributable to acute uncomplicated appendicitis without abscess or perforation, complicated by pertinent comorbidities including morbid obesity.    - Admit to general surgery  - NPO + IVF  - IV Abx (Zosyn >> day 1)   - pain control prn: antiemetics prn  - monitor abdominal examination   - Will plan on laparoscopic appendectomy with Dr Aleen CampiPiscoya pending OR/Anesthesia availability  - medical management of comorbid conditions   - DVT prophylaxis; hold for OR  All of the above findings and recommendations were discussed with the patient and her family (husband), and all of their questions were answered to their expressed satisfaction.  -- Lynden OxfordZachary Shequita Peplinski, PA-C [facility] 10/02/2019, 9:48 AM [phone] M-F: 7am - 4pm

## 2019-10-02 NOTE — Transfer of Care (Signed)
Immediate Anesthesia Transfer of Care Note  Patient: [name]  Procedure(s) Performed: APPENDECTOMY LAPAROSCOPIC (N/A )  Patient Location: PACU  Anesthesia Type:General  Level of Consciousness: sedated  Airway & Oxygen Therapy: Patient Spontanous Breathing and Patient connected to face mask oxygen  Post-op Assessment: Report given to RN and Post -op Vital signs reviewed and stable  Post vital signs: Reviewed  Last Vitals:  Vitals Value Taken Time  BP 108/65 10/02/19 2001  Temp    Pulse 88 10/02/19 2001  Resp 12 10/02/19 2001  SpO2 95 % 10/02/19 2001  Vitals shown include unvalidated device data.  Last Pain:  Vitals:   10/02/19 1308  TempSrc: Oral  PainSc: 5       Patients Stated Pain Goal: 0 (86/76/19 5093)  Complications: No apparent anesthesia complications

## 2019-10-02 NOTE — Telephone Encounter (Signed)
Per chart review tab  Pt is at [facility] ED. 

## 2019-10-02 NOTE — Anesthesia Preprocedure Evaluation (Addendum)
Anesthesia Evaluation  Patient identified by MRN, date of birth, ID band Patient awake    Reviewed: Allergy & Precautions, H&P , NPO status , Patient's Chart, lab work & pertinent test results  Airway Mallampati: III  TM Distance: >3 FB Neck ROM: full    Dental  (+) Teeth Intact   Pulmonary former smoker,           Cardiovascular hypertension,      Neuro/Psych negative neurological ROS  negative psych ROS   GI/Hepatic negative GI ROS, Neg liver ROS,   Endo/Other  Morbid obesity  Renal/GU      Musculoskeletal   Abdominal   Peds  Hematology negative hematology ROS (+)   Anesthesia Other Findings Past Medical History: 2008: Concussion No date: Hx of migraines No date: Viral meningitis   Reproductive/Obstetrics negative OB ROS                            Anesthesia Physical Anesthesia Plan  ASA: II  Anesthesia Plan: General ETT   Post-op Pain Management:    Induction:   PONV Risk Score and Plan: Ondansetron, Dexamethasone, Midazolam and Treatment may vary due to age or medical condition  Airway Management Planned:   Additional Equipment:   Intra-op Plan:   Post-operative Plan:   Informed Consent: I have reviewed the patients History and Physical, chart, labs and discussed the procedure including the risks, benefits and alternatives for the proposed anesthesia with the patient or authorized representative who has indicated his/her understanding and acceptance.     Dental Advisory Given  Plan Discussed with: Anesthesiologist, CRNA and Surgeon  Anesthesia Plan Comments:         Anesthesia Quick Evaluation

## 2019-10-02 NOTE — Telephone Encounter (Signed)
[facility] [facility] Night - Client TELEPHONE ADVICE RECORD [facility] [name] Gender: Female DOB: [birth year] Age: 53 Y 17 D Return Phone Number: [phone] (Primary) Address: City/State/Zip: [place] [address] Client [place] Night - Client Client Site [place] Physician Tower, Roque Lias - MD Contact Type Call Who Is Calling Patient / Member / Family / Caregiver Call Type Triage / Clinical Relationship To Patient Self Return Phone Number [phone] (Primary) Chief Complaint ABDOMINAL PAIN - Severe and only in abdomen Reason for Call Symptomatic / Request for [place] states that she is having some severe abd pain and she is nauseous. Translation No Nurse Assessment Nurse: Zenia Resides, RN, Diane Date/Time Eilene Ghazi Time): 10/02/2019 6:38:38 AM Confirm and document reason for call. If symptomatic, describe symptoms. ---Caller states she is having some severe abdominal painsymptoms started two days ago, but worse today. Also having nausea and lightheadedness. Temp is 96.5. Pain is on the mid right side. Has the patient had close contact with a person known or suspected to have the novel coronavirus illness OR traveled / lives in area with major community spread (including international travel) in the last 14 days from the onset of symptoms? * If Asymptomatic, screen for exposure and travel within the last 14 days. ---No Does the patient have any new or worsening symptoms? ---Yes Will a triage be completed? ---Yes Related visit to physician within the last 2 weeks? ---No Does the PT have any chronic conditions? (i.e. diabetes, asthma, this includes High risk factors for pregnancy, etc.) ---Yes List chronic conditions. ---fibromyalgia, reactive arthritis Is the patient pregnant or possibly pregnant? (Ask all females between the ages of 71-55) ---No Is  this a behavioral health or substance abuse call? ---No Guidelines Guideline Title Affirmed Question Affirmed Notes Nurse Date/Time (Eastern Time) Abdominal Pain - Female [1] SEVERE pain (e.g., excruciating) AND [2] present > 1 hour Zenia Resides, RN, Diane 10/02/2019 6:41:21 AM PLEASE NOTE: All timestamps contained within this report are represented as [place] Standard Time. CONFIDENTIALTY NOTICE: This fax transmission is intended only for the addressee. It contains information that is legally privileged, confidential or otherwise protected from use or disclosure. If you are not the intended recipient, you are strictly prohibited from reviewing, disclosing, copying using or disseminating any of this information or taking any action in reliance on or regarding this information. If you have received this fax in error, please notify us immediately by telephone so that we can arrange for its return to [place]. Phone: [phone], Toll-Free: [phone], Fax: [phone] Page: 2 of 2 Call Id: [id number] [place]. Time Eilene Ghazi Time) Disposition Final User 10/02/2019 6:36:48 AM Send to Urgent Queue [place] 10/02/2019 6:44:06 AM Go to ED Now Yes Zenia Resides, RN, Diane Caller Disagree/Comply Comply Caller Understands Yes PreDisposition Go to ED Care Advice Given Per Guideline GO TO ED NOW: * You need to be seen in the Emergency Department. ANOTHER ADULT SHOULD DRIVE: * It is better and safer if another adult drives instead of you. NOTHING BY MOUTH: Do not eat or drink anything for now. (Reason: condition may need surgery and general anesthesia.) CARE ADVICE given per Abdominal Pain, Female (Adult) guideline. Referrals [facility] - ED

## 2019-10-02 NOTE — Anesthesia Post-op Follow-up Note (Signed)
Anesthesia QCDR form completed.        

## 2019-10-02 NOTE — ED Triage Notes (Signed)
Pt reports abdominal pain that started 2 days ago and has now moved to her right side. Pt reports nauseated as well and feels dizzy. Pt called her MD who advised her to come to the ED.

## 2019-10-02 NOTE — Progress Notes (Signed)
Anticoagulation monitoring(Lovenox):  53yo  female ordered Lovenox 40 mg Q24h  Filed Weights   10/02/19 0723  Weight: 275 lb (124.7 kg)   BMI 40.61   Lab Results  Component Value Date   CREATININE 0.68 10/02/2019   CREATININE 0.92 05/28/2019   CREATININE 1.10 12/18/2013   Estimated Creatinine Clearance: 116.4 mL/min (by C-G formula based on SCr of 0.68 mg/dL). Hemoglobin & Hematocrit     Component Value Date/Time   HGB 13.6 10/02/2019 0733   HGB 13.7 12/18/2013 1830   HCT 41.8 10/02/2019 0733   HCT 42.6 12/18/2013 1830     Per Protocol for Patient with estCrcl > 30 ml/min and BMI > 40, will transition to Lovenox 40 mg Q12h.

## 2019-10-02 NOTE — Op Note (Signed)
  Procedure Date:  10/02/2019  Pre-operative Diagnosis:  Acute appendicitis  Post-operative Diagnosis:  Acute appendicitis and umbilical hernia  Procedure:  Laparoscopic appendectomy, open umbilical hernia repair.  Surgeon:  Melvyn Neth, MD  Anesthesia:  General endotracheal  Estimated Blood Loss:  10 ml  Specimens:  appendix  Complications:  None  Indications for Procedure:  This is a 53 y.o. female who presents with abdominal pain and workup revealing acute appendicitis.  The options of surgery versus observation were reviewed with the patient and/or family. The risks of bleeding, infection, recurrence of symptoms, negative laparoscopy, potential for an open procedure, bowel injury, abscess or infection, were all discussed with the patient and she was willing to proceed.  Description of Procedure: The patient was correctly identified in the preoperative area and brought into the operating room.  The patient was placed supine with VTE prophylaxis in place.  Appropriate time-outs were performed.  Anesthesia was induced and the patient was intubated.  Foley catheter was placed.  Appropriate antibiotics were infused.  The abdomen was prepped and draped in a sterile fashion. An infraumbilical incision was made. Cautery was used to dissect down the subcutaneous tissue.  A small umbilical hernia defect was palpable.  The umbilical stalk was dissected off the fascia revealing the umbilical defect of 1 cm in size.  The edges were cleaned and a Hasson trocar was inserted through the defect.  Pneumoperitoneum was obtained with appropriate opening pressures.  Two 5-mm ports were placed in the suprapubic and left lateral positions under direct visualization.  The right lower quadrant was inspected and the appendix was identified in retrocecal position and found to be acutely inflamed but not perforated.  The patient had ascitic fluid in the pelvis but no purulence.  The appendix had one small  area of gangrene but no perforation.  The appendix was carefully dissected.  The mesoappendix was divided using the LigaSure.  The base of the appendix was dissected out and divided with a standard load Endo GIA.  The appendix was placed in an Endocatch bag.  The right lower quadrant was then inspected again revealing an intact staple line, no bleeding, and no bowel injury.  The area was thoroughly irrigated.  A 19 Fr. Blake drain was placed via the left lateral port going to pelvis and coiling back to the RLQ.  The 5 mm ports were removed under direct visualization and the Hasson trocar was removed.  The Endocatch bag was brought out through the umbilical incision.  The fascial opening / hernia defect was closed using 0 vicryl suture.  The umbilical stalk was reattached to the fascia using 2-0 Vicryl.  Local anesthetic was infused in all incisions and the incisions were closed with 3-0 Vicryl and 4-0 Monocryl.  The drain was secured to the skin using 3-0 Nylon.  The wounds were cleaned and sealed with DermaBond.  The drain was dressed with 4x4 gauze and Tegaderm.  Foley catheter was removed and the patient was emerged from anesthesia and extubated and brought to the recovery room for further management.  The patient tolerated the procedure well and all counts were correct at the end of the case.   Melvyn Neth, MD

## 2019-10-02 NOTE — Anesthesia Procedure Notes (Signed)
Procedure Name: Intubation Date/Time: 10/02/2019 5:59 PM Performed by: Allean Found, CRNA Pre-anesthesia Checklist: Patient identified, Patient being monitored, Timeout performed, Emergency Drugs available and Suction available Patient Re-evaluated:Patient Re-evaluated prior to induction Oxygen Delivery Method: Circle system utilized Preoxygenation: Pre-oxygenation with 100% oxygen Induction Type: IV induction Ventilation: Mask ventilation without difficulty Laryngoscope Size: McGraph and 3 Grade View: Grade I Tube type: Oral Tube size: 7.0 mm Number of attempts: 1 Airway Equipment and Method: Stylet Placement Confirmation: ETT inserted through vocal cords under direct vision,  positive ETCO2 and breath sounds checked- equal and bilateral Secured at: 21 cm Tube secured with: Tape Dental Injury: Teeth and Oropharynx as per pre-operative assessment

## 2019-10-02 NOTE — ED Provider Notes (Signed)
[facility] Emergency Department Provider Note   ____________________________________________   First MD Initiated Contact with Patient 10/02/19 [phone]     (approximate)  I have reviewed the triage vital signs and the nursing notes.   HISTORY  Chief Complaint Abdominal Pain, Nausea, and Dizziness    HPI [name] is a 53 y.o. female with past medical history of hypertension and fibromyalgia who presents to the ED complaining abdominal pain.  Patient reports that 2 days ago she first developed diffuse abdominal cramping.  Pain has gradually worsened since then and become more sharp with localization to her right lower quadrant.  It is constant and not exacerbated or alleviated by anything.  She describes an associated poor appetite as well as nausea, but she has not vomited.  She denies any changes in her bowel movements, dysuria, hematuria, fevers, cough, chest pain, or shortness of breath.  She does not have any abdominal surgical history.        Past Medical History:  Diagnosis Date  . Concussion 2008  . Hx of migraines   . Viral meningitis     Patient Active Problem List   Diagnosis Date Noted  . Screening mammogram, encounter for 08/28/2019  . Routine general medical examination at a health care facility 08/28/2019  . Shoulder pain, left 06/05/2019  . Morbid obesity (HCC) 05/28/2019  . Hypertension, essential 05/28/2019  . 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 Multiple joint pain 07/02/2018  . Foot pain, bilateral 07/02/2018  . Fibromyalgia 05/20/2018  . Low back pain 02/26/2018    History reviewed. No pertinent surgical history.  Prior to Admission medications   Medication Sig Start Date End Date Taking? Authorizing Provider  naproxen (NAPROSYN) 500 MG tablet Take 1 tablet (500 mg total) by mouth 2 (two) times daily with a meal. As needed for pain 07/14/19   Tower, Audrie Gallus, MD    Allergies Influenza a (h1n1) monovalent vaccine and  Vancomycin  Family History  Problem Relation Age of Onset  . Breast cancer Neg Hx     Social History Social History   Tobacco Use  . Smoking status: Former Smoker    Packs/day: 0.25    Years: 3.00    Pack years: 0.75    Types: Cigarettes    Quit date: 08/25/2013    Years since quitting: 6.1  . Smokeless tobacco: Never Used  . Tobacco comment: no smoking in 5 days  Substance Use Topics  . Alcohol use: Yes    Alcohol/week: 1.0 standard drinks    Types: 1 Cans of beer per week    Comment: occ  . Drug use: No    Review of Systems  Constitutional: No fever/chills Eyes: No visual changes. ENT: No sore throat. Cardiovascular: Denies chest pain. Respiratory: Denies shortness of breath. Gastrointestinal: Positive for abdominal pain.  Positive for nausea, no vomiting.  No diarrhea.  No constipation. Genitourinary: Negative for dysuria. Musculoskeletal: Negative for back pain. Skin: Negative for rash. Neurological: Negative for headaches, focal weakness or numbness.  ____________________________________________   PHYSICAL EXAM:  VITAL SIGNS: ED Triage Vitals  Enc Vitals Group     BP --      Pulse --      Resp --      Temp --      Temp src --      SpO2 --      Weight 10/02/19 0723 275 lb (124.7 kg)     Height 10/02/19 0723 5\' 9"  (1.753  m)     Head Circumference --      Peak Flow --      Pain Score 10/02/19 0721 4     Pain Loc --      Pain Edu? --      Excl. in [place]? --     Constitutional: Alert and oriented. Eyes: Conjunctivae are normal. Head: Atraumatic. Nose: No congestion/rhinnorhea. Mouth/Throat: Mucous membranes are moist. Neck: Normal ROM Cardiovascular: Normal rate, regular rhythm. Grossly normal heart sounds. Respiratory: Normal respiratory effort.  No retractions. Lungs CTAB. Gastrointestinal: Soft and tender to palpation in the right lower quadrant with no rebound or guarding. No distention. Genitourinary: deferred Musculoskeletal: No lower  extremity tenderness nor edema. Neurologic:  Normal speech and language. No gross focal neurologic deficits are appreciated. Skin:  Skin is warm, dry and intact. No rash noted. Psychiatric: Mood and affect are normal. Speech and behavior are normal.  ____________________________________________   LABS (all labs ordered are listed, but only abnormal results are displayed)  Labs Reviewed  COMPREHENSIVE METABOLIC PANEL - Abnormal; Notable for the following components:      Result Value   Glucose, Bld 119 (*)    All other components within normal limits  URINALYSIS, COMPLETE (UACMP) WITH MICROSCOPIC - Abnormal; Notable for the following components:   Color, Urine YELLOW (*)    APPearance HAZY (*)    Bacteria, UA RARE (*)    All other components within normal limits  LIPASE, BLOOD  CBC   ____________________________________________  EKG  ED ECG REPORT I, Blake Divine, the attending physician, personally viewed and interpreted this ECG.   Date: 10/02/2019  EKG Time: 7:47  Rate: 80  Rhythm: normal sinus rhythm  Axis: Normal  Intervals:none  ST&T Change: None   PROCEDURES  Procedure(s) performed (including Critical Care):  Procedures   ____________________________________________   INITIAL IMPRESSION / ASSESSMENT AND PLAN / ED COURSE       53 year old female presents to the ED with 2 days of gradually worsening abdominal pain that was initially diffuse but has subsequently located to her right lower quadrant.  She does have some right lower quadrant tenderness on exam without peritoneal signs, will assess for appendicitis with CT.  Doubt cardiac etiology, EKG without acute ischemic changes.  Will screen labs and UA.  Patient is postmenopausal.  CT consistent with acute uncomplicated appendicitis.  Remainder of labs are unremarkable, including UA.  Patient was given a dose of Zosyn and case was discussed with Dr. Hampton Abbot of general surgery, who will plan on operative  intervention later today.  Patient approved for rapid Covid testing given surgical intervention within the next 8 hours.      ____________________________________________   FINAL CLINICAL IMPRESSION(S) / ED DIAGNOSES  Final diagnoses:  Acute appendicitis, unspecified acute appendicitis type     ED Discharge Orders    None       Note:  This document was prepared using Dragon voice recognition software and may include unintentional dictation errors.   Blake Divine, MD 10/02/19 [phone]

## 2019-10-02 NOTE — Telephone Encounter (Signed)
She has appendicitis and surgery is planned today

## 2019-10-03 ENCOUNTER — Encounter: Payer: Self-pay | Admitting: Surgery

## 2019-10-03 MED ORDER — AMOXICILLIN-POT CLAVULANATE 875-125 MG PO TABS: 1.0000 | ORAL_TABLET | Freq: Two times a day (BID) | ORAL | 0 refills | Status: AC

## 2019-10-03 MED ORDER — OXYCODONE HCL 5 MG PO TABS: 5.0000 mg | ORAL_TABLET | Freq: Four times a day (QID) | ORAL | 0 refills | Status: DC | PRN

## 2019-10-03 MED ORDER — IBUPROFEN 800 MG PO TABS: 800.0000 mg | ORAL_TABLET | Freq: Three times a day (TID) | ORAL | 0 refills | Status: DC | PRN

## 2019-10-03 NOTE — Progress Notes (Signed)
Discharge instructions reviewed with the patient. Patient assisted with emptying her drain and watched me change the dressing to her JP drain. Sent out via wheelchair to her husbands waiting car

## 2019-10-03 NOTE — Discharge Instructions (Signed)
In addition to included general post-operative instructions for laparoscopic appendectomy,  Diet: Resume home diet.   Activity: No heavy lifting >20 pounds (children, pets, laundry, garbage) for 4 weeks, but light activity and walking are encouraged. Do not drive or drink alcohol if taking narcotic pain medications or having pain that might distract from driving.  Wound care: 2 days after surgery (11/14), you may shower/get incision wet with soapy water and pat dry (do not rub incisions), but no baths or submerging incision underwater until follow-up.   Medications: Resume all home medications. For mild to moderate pain: acetaminophen (Tylenol) or ibuprofen/naproxen (if no kidney disease). Combining Tylenol with alcohol can substantially increase your risk of causing liver disease. Narcotic pain medications, if prescribed, can be used for severe pain, though may cause nausea, constipation, and drowsiness. Do not combine Tylenol and Percocet (or similar) within a 6 hour period as Percocet (and similar) contain(s) Tylenol. If you do not need the narcotic pain medication, you do not need to fill the prescription.  Call office [phone] / [phone]) at any time if any questions, worsening pain, fevers/chills, bleeding, drainage from incision site, or other concerns.

## 2019-10-03 NOTE — Discharge Summary (Signed)
[facility] SURGICAL DISCHARGE SUMMARY  Patient ID: [name] MRN: [id number] DOB/AGE: [birth year] 53 y.o.  Admit date: 10/02/2019 Discharge date: 10/03/2019  Discharge Diagnoses Patient Active Problem List   Diagnosis Date Noted  . Acute appendicitis 10/02/2019    Consultants None  Procedures 10/02/2019:  Laparoscopic Appendectomy  HPI: 53 y.o. female presented to [facility] ED today for abdominal pain. Patient reports about a 2 day history of diffuse abdominal pain which she described as crampy in nature. The pain has since worsened and become a sharp pain which has localized to the RLQ. Nothing provides relief from the pain. She endorse decreased appetite and nausea. No fever, chills, cough, congestion, SOB, CP, emesis, or bladder or bowel changes. No history of similar pain in the past. No previous abdominal surgeries. Work up in the ED was concerning for acute uncomplicated appendicitis on CT with normal WBC and no other appreciable lab abnormalities.   Hospital Course: Informed consent was obtained and documented, and patient underwent uneventful laparoscopic appendectomy (Dr Hampton Abbot, 10/02/2019).  Post-operatively, patient's pain/symptoms improved/resolved and advancement of patient's diet and ambulation were well-tolerated. The remainder of patient's hospital course was essentially unremarkable, and discharge planning was initiated accordingly with patient safely able to be discharged home with appropriate discharge instructions, antibiotics (Augmentin x10 days), pain control, and outpatient follow-up after all of her questions were answered to her expressed satisfaction.   Discharge Condition: Good   Physical Examination:  Constitutional: Well appearing female, NAD Pulmonary: Normal effort, no respiratory distress Gastrointestinal: Soft, non-tender, non-distended, no rebound or guarding. Blake drain in LLQ with serosanguinous output Skin: Laparoscopic  incisions are CDI with dermabond, ecchymosis to umbilical incision, no erythema, no drainage    Allergies as of 10/03/2019      Reactions   Influenza A (h1n1) Monovalent Vaccine    Fever and aches and flu symptoms and n/v   Vancomycin Rash   Red man reaction      Medication List    TAKE these medications   amoxicillin-clavulanate 875-125 MG tablet Commonly known as: Augmentin Take 1 tablet by mouth 2 (two) times daily for 10 days.   ibuprofen 800 MG tablet Commonly known as: ADVIL Take 1 tablet (800 mg total) by mouth every 8 (eight) hours as needed.   magnesium oxide 400 MG tablet Commonly known as: MAG-OX Take 400 mg by mouth daily.   naproxen 500 MG tablet Commonly known as: NAPROSYN Take 1 tablet (500 mg total) by mouth 2 (two) times daily with a meal. As needed for pain   oxyCODONE 5 MG immediate release tablet Commonly known as: Oxy IR/ROXICODONE Take 1 tablet (5 mg total) by mouth every 6 (six) hours as needed for severe pain or breakthrough pain.        Follow-up Information    Tylene Fantasia, PA-C. Schedule an appointment as soon as possible for a visit on 10/07/2019.   Specialty: Physician Assistant Why: s/p lap appy, has blake drain Contact information: [address] [place] [address] [phone]            Time spent on discharge management including discussion of hospital course, clinical condition, outpatient instructions, prescriptions, and follow up with the patient and members of the medical team: >30 minutes  -- [name] , PA-C [facility]  10/03/2019, 8:28 AM [phone] M-F: 7am - 4pm

## 2019-10-03 NOTE — Anesthesia Postprocedure Evaluation (Signed)
Anesthesia Post Note  Patient: [name]  Procedure(s) Performed: APPENDECTOMY LAPAROSCOPIC (N/A )  Patient location during evaluation: PACU Anesthesia Type: General Level of consciousness: awake and alert Pain management: pain level controlled Vital Signs Assessment: post-procedure vital signs reviewed and stable Respiratory status: spontaneous breathing, nonlabored ventilation and respiratory function stable Cardiovascular status: blood pressure returned to baseline and stable Postop Assessment: no apparent nausea or vomiting Anesthetic complications: no     Last Vitals:  Vitals:   10/02/19 2234 10/02/19 2249  BP: 131/73 124/69  Pulse: 81 74  Resp: 18 16  Temp: 37.2 C 36.9 C  SpO2: 91% 97%    Last Pain:  Vitals:   10/02/19 2249  TempSrc: Oral  PainSc:                  Durenda Hurt

## 2019-10-06 ENCOUNTER — Telehealth: Payer: Self-pay

## 2019-10-06 LAB — SURGICAL PATHOLOGY

## 2019-10-06 NOTE — Telephone Encounter (Signed)
Left message for patient to call back to let her know 

## 2019-10-06 NOTE — Telephone Encounter (Signed)
Just the surgeon is fine unless she has special needs from [place] Thanks

## 2019-10-06 NOTE — Telephone Encounter (Signed)
Patient returned phone call and states she will just follow up with her surgeon, and will reach out to [place] later if she needs anything. Thanks!

## 2019-10-06 NOTE — Telephone Encounter (Signed)
Dr Glori Bickers patient was D/C on 10/03/2019 after Appendectomy. Do you want patient to follow up here or just surgeon?

## 2019-10-07 ENCOUNTER — Encounter: Payer: Self-pay | Admitting: Physician Assistant

## 2019-10-07 ENCOUNTER — Other Ambulatory Visit: Payer: Self-pay

## 2019-10-07 ENCOUNTER — Ambulatory Visit (INDEPENDENT_AMBULATORY_CARE_PROVIDER_SITE_OTHER): Payer: BC Managed Care – PPO | Admitting: Physician Assistant

## 2019-10-07 VITALS — BP 142/82 | HR 78 | Temp 97.9°F | Resp 14 | Ht 69.0 in | Wt 275.0 lb

## 2019-10-07 DIAGNOSIS — K353 Acute appendicitis with localized peritonitis, without perforation or gangrene: Secondary | ICD-10-CM

## 2019-10-07 DIAGNOSIS — Z09 Encounter for follow-up examination after completed treatment for conditions other than malignant neoplasm: Secondary | ICD-10-CM

## 2019-10-07 NOTE — Patient Instructions (Addendum)
Please see your follow up appointment listed below.      NERAL POST-OPERATIVE PATIENT INSTRUCTIONS   WOUND CARE INSTRUCTIONS:  Keep a dry clean dressing on the wound if there is drainage. The initial bandage may be removed after 24 hours.  Once the wound has quit draining you may leave it open to air.  If clothing rubs against the wound or causes irritation and the wound is not draining you may cover it with a dry dressing during the daytime.  Try to keep the wound dry and avoid ointments on the wound unless directed to do so.  If the wound becomes bright red and painful or starts to drain infected material that is not clear, please contact your physician immediately.  If the wound is mildly pink and has a thick firm ridge underneath it, this is normal, and is referred to as a healing ridge.  This will resolve over the next 4-6 weeks.  BATHING: You may shower if you have been informed of this by your surgeon. However, Please do not submerge in a tub, hot tub, or pool until incisions are completely sealed or have been told by your surgeon that you may do so.  DIET:  You may eat any foods that you can tolerate.  It is a good idea to eat a high fiber diet and take in plenty of fluids to prevent constipation.  If you do become constipated you may want to take a mild laxative or take ducolax tablets on a daily basis until your bowel habits are regular.  Constipation can be very uncomfortable, along with straining, after recent surgery.  ACTIVITY:  You are encouraged to cough and deep breath or use your incentive spirometer if you were given one, every 15-30 minutes when awake.  This will help prevent respiratory complications and low grade fevers post-operatively if you had a general anesthetic.  You may want to hug a pillow when coughing and sneezing to add additional support to the surgical area, if you had abdominal or chest surgery, which will decrease pain during these times.  You are encouraged to  walk and engage in light activity for the next two weeks.  You should not lift more than 20 pounds, until 10/30/2019  as it could put you at increased risk for complications.  Twenty pounds is roughly equivalent to a plastic bag of groceries. At that time- Listen to your body when lifting, if you have pain when lifting, stop and then try again in a few days. Soreness after doing exercises or activities of daily living is normal as you get back in to your normal routine.  MEDICATIONS:  Try to take narcotic medications and anti-inflammatory medications, such as tylenol, ibuprofen, naprosyn, etc., with food.  This will minimize stomach upset from the medication.  Should you develop nausea and vomiting from the pain medication, or develop a rash, please discontinue the medication and contact your physician.  You should not drive, make important decisions, or operate machinery when taking narcotic pain medication.  SUNBLOCK Use sun block to incision area over the next year if this area will be exposed to sun. This helps decrease scarring and will allow you avoid a permanent darkened area over your incision.  QUESTIONS:  Please feel free to call our office if you have any questions, and we will be glad to assist you. [phone]

## 2019-10-07 NOTE — Progress Notes (Signed)
[facility] POST-OP OFFICE VISIT  10/07/2019  HPI: [name] is a 53 y.o. female 5 days s/p laparoscopic appendectomy with Dr Hampton Abbot  Today, she is doing well. She reports that she has had some discomfort at the drain site but this is managed with NSAIDs. No fever or chills. She is having some diarrhea. No nausea or emesis. Drain has been draining about 1-2 ounces a day and has been serosanguinous. No other issues.   Vital signs: LMP 08/25/2015    Physical Exam: Constitutional: Well appearing female, NAD Abdomen: Soft, non-tender, non-distended, no rebound/guarding. JP in the LLQ with minimal serosanguinous output Skin: Laparoscopic incisions are CDI with dermabond, no erythema or drianage, some ecchymosis  Assessment/Plan: This is a 53 y.o. female 5 days s/p laparoscopic appendectomy   - Pain control prn  - removed drain and placed occlusive dressing  - okay to shower  - continue lifting restrictions  - reviewed pathology: Acute gangrenous appendicitis   - rtc in 2 weeks  -- Edison Simon, PA-C [facility] 10/07/2019, 1:59 PM [phone] M-F: 7am - 4pm

## 2019-10-20 ENCOUNTER — Encounter: Payer: Self-pay | Admitting: Surgery

## 2019-10-21 ENCOUNTER — Encounter: Payer: Self-pay | Admitting: Surgery

## 2019-10-21 ENCOUNTER — Other Ambulatory Visit: Payer: Self-pay

## 2019-10-21 ENCOUNTER — Ambulatory Visit (INDEPENDENT_AMBULATORY_CARE_PROVIDER_SITE_OTHER): Payer: BC Managed Care – PPO | Admitting: Surgery

## 2019-10-21 VITALS — BP 130/78 | HR 78 | Temp 97.7°F | Ht 69.0 in | Wt 278.0 lb

## 2019-10-21 DIAGNOSIS — Z09 Encounter for follow-up examination after completed treatment for conditions other than malignant neoplasm: Secondary | ICD-10-CM

## 2019-10-21 DIAGNOSIS — K3580 Unspecified acute appendicitis: Secondary | ICD-10-CM

## 2019-10-21 NOTE — Patient Instructions (Signed)
Follow-up with our office as needed.  Please call and ask to speak with a nurse if you develop questions or concerns.  May resume normal activities on December 10th.

## 2019-10-21 NOTE — Progress Notes (Signed)
10/21/2019  HPI: [name] is a 53 y.o. female s/p laparoscopic appendectomy and open umbilical hernia repair on 11/12.  Umbilical hernia was an incidental finding.  She was seen last on 11/17 at which time her drain was removed.  She reports she's been doing well without any issues.  Denies any nausea or vomiting and is tolerating a diet.  She does get some diarrhea after drinking coffee but it has been improving.  Denies any worsening pain, fevers, or chills.  Vital signs: BP 130/78   Pulse 78   Temp 97.7 F (36.5 C)   Ht 5\' 9"  (1.753 m)   Wt 278 lb (126.1 kg)   LMP 08/25/2015   SpO2 98%   BMI 41.05 kg/m    Physical Exam: Constitutional: No acute distress Abdomen:  Soft, non-distended, non-tender to palpation.  Umbilical incision with a moderate size scab which was excised at bedside.  Wound reveals mild superficial opening of the skin edges at both corners, but for only about 1 mm width, and 1 mm depth.  No evidence of infection.  Other two incisions are well healed.  Applied BandAid to the umbilical incision.  Assessment/Plan: This is a 53 y.o. female s/p laparoscopic appendectomy and umbilical hernia repair.  Discussed with the patient that she's healing well and the scab was removed to allow the wound beneath to heal better and get more oxygen.  There is no evidence of infection or other wound breakdown.    Discussed with her that she still has a no heavy lifting or pushing restriction until 12/10.  She may resume her usual activities after that.  Otherwise follow up prn.   Melvyn Neth, [facility]

## 2019-10-22 ENCOUNTER — Encounter: Payer: Self-pay | Admitting: Surgery

## 2019-11-03 ENCOUNTER — Other Ambulatory Visit: Payer: Self-pay | Admitting: Family Medicine

## 2019-11-03 NOTE — Telephone Encounter (Signed)
CPE was on 08/28/19, last filled on 07/14/19 #60 tabs with 1 refills, please advise

## 2019-12-24 ENCOUNTER — Telehealth: Payer: Self-pay | Admitting: *Deleted

## 2019-12-24 DIAGNOSIS — H9313 Tinnitus, bilateral: Secondary | ICD-10-CM

## 2019-12-24 DIAGNOSIS — H9319 Tinnitus, unspecified ear: Secondary | ICD-10-CM | POA: Insufficient documentation

## 2019-12-24 NOTE — Telephone Encounter (Signed)
Order done Will send to [facility]  Made this urgent because she would like to be seen before it is time for her 2nd covid vaccine

## 2019-12-24 NOTE — Telephone Encounter (Signed)
I cannot find that as a sited side effect to the vaccine  (does not mean it does not exist).  Headache is more common.  Unsure if it is linked so cannot advise re: booster   We could set her up with an ENT specialist to address the symptom/work it up - that may help Let me know if agreeable

## 2019-12-24 NOTE — Telephone Encounter (Signed)
Pt notified of Dr. Royden Purl recommendations and she does want to see an ENT also she agrees that if it's not a side eff of the vaccine she wants to know what's causing the ear issues. Pt advise Dr. Milinda Antis will put referral in and our [facility] will call her to schedule appt

## 2019-12-24 NOTE — Telephone Encounter (Signed)
Urgent Referral sent to [facility], spoke with patient and she is aware that they will call her to schedule.

## 2019-12-24 NOTE — Telephone Encounter (Signed)
Patient called stating she had her first covid vaccine on 12/16/19 which was [employer].. Patient stated several days later she started with ringing in her ears. Patient stated that the ringing in her ears is getting worse each day. Patient stated that she had meningitis several years back and at that time she had ringing in her ears. Patient  denies any other symptoms. Patient stated that she is pretty sure that she had covid last spring. Patient stated that she is due to have her second vaccine in a couple of weeks and is concerned since she has developed the ringing in her ears. Patient wants to know what Dr. Milinda Antis recommends?

## 2019-12-26 DIAGNOSIS — H9311 Tinnitus, right ear: Secondary | ICD-10-CM | POA: Diagnosis not present

## 2019-12-26 DIAGNOSIS — J301 Allergic rhinitis due to pollen: Secondary | ICD-10-CM | POA: Diagnosis not present

## 2019-12-26 DIAGNOSIS — H6981 Other specified disorders of Eustachian tube, right ear: Secondary | ICD-10-CM | POA: Diagnosis not present

## 2020-03-10 ENCOUNTER — Telehealth: Payer: Self-pay

## 2020-03-10 NOTE — Telephone Encounter (Signed)
She needs a gyn visit - since this is considered post menopausal bleeding.  They will more than likely get a pelvic ultrasound and may do an endometrial biopsy depending on that.  If she needs help getting an appt. Please let me know

## 2020-03-10 NOTE — Telephone Encounter (Signed)
Pt said she thinks has been 1 - 1 1/2 yrs since menstrual period; on 03/08/20 pt started heavy bright red vaginal bleed that was similar to her menstrual flow when she used to have a period; pt said no spotting occurred just on Mon started with heavy vaginal bleed. Pt has slight lower abd cramping like used to have when would start menstrual period. Very small blood clots with bright red blood. Last 24 hours pt has used 2 tampons that will not hold all of flow and pt has used 4 menstrual pads. Pt had last annual exam on 08/27/2020 and OB GYN did last pap smear at least 2 yrs ago. Pt has not been under stress. Last wk breast were tender and then bleeding started on 03/08/20. Pt wants to know if should be concerned; should pt be seen by OBGYN for pap or what does Dr Milinda Antis think pt should do. ED precautions given and pt voiced understanding. Pt request cb after Dr Milinda Antis reviews.

## 2020-03-10 NOTE — Telephone Encounter (Signed)
(  Per DPR) left VM letting pt know Dr. Royden Purl comments and recommendations. I advised pt in VM to call her GYN and let them know what's going on and if she doesn't have one to call us back and we can refer her if needed

## 2020-04-28 IMAGING — DX LEFT SHOULDER - 2+ VIEW
3 series · 3 of 3 positions shown · non-contrast
Comparison: None.

CLINICAL DATA: Acute onset of LEFT shoulder pain and stiffness.

EXAM:
LEFT SHOULDER - 2+ VIEW

[shoulder axial]
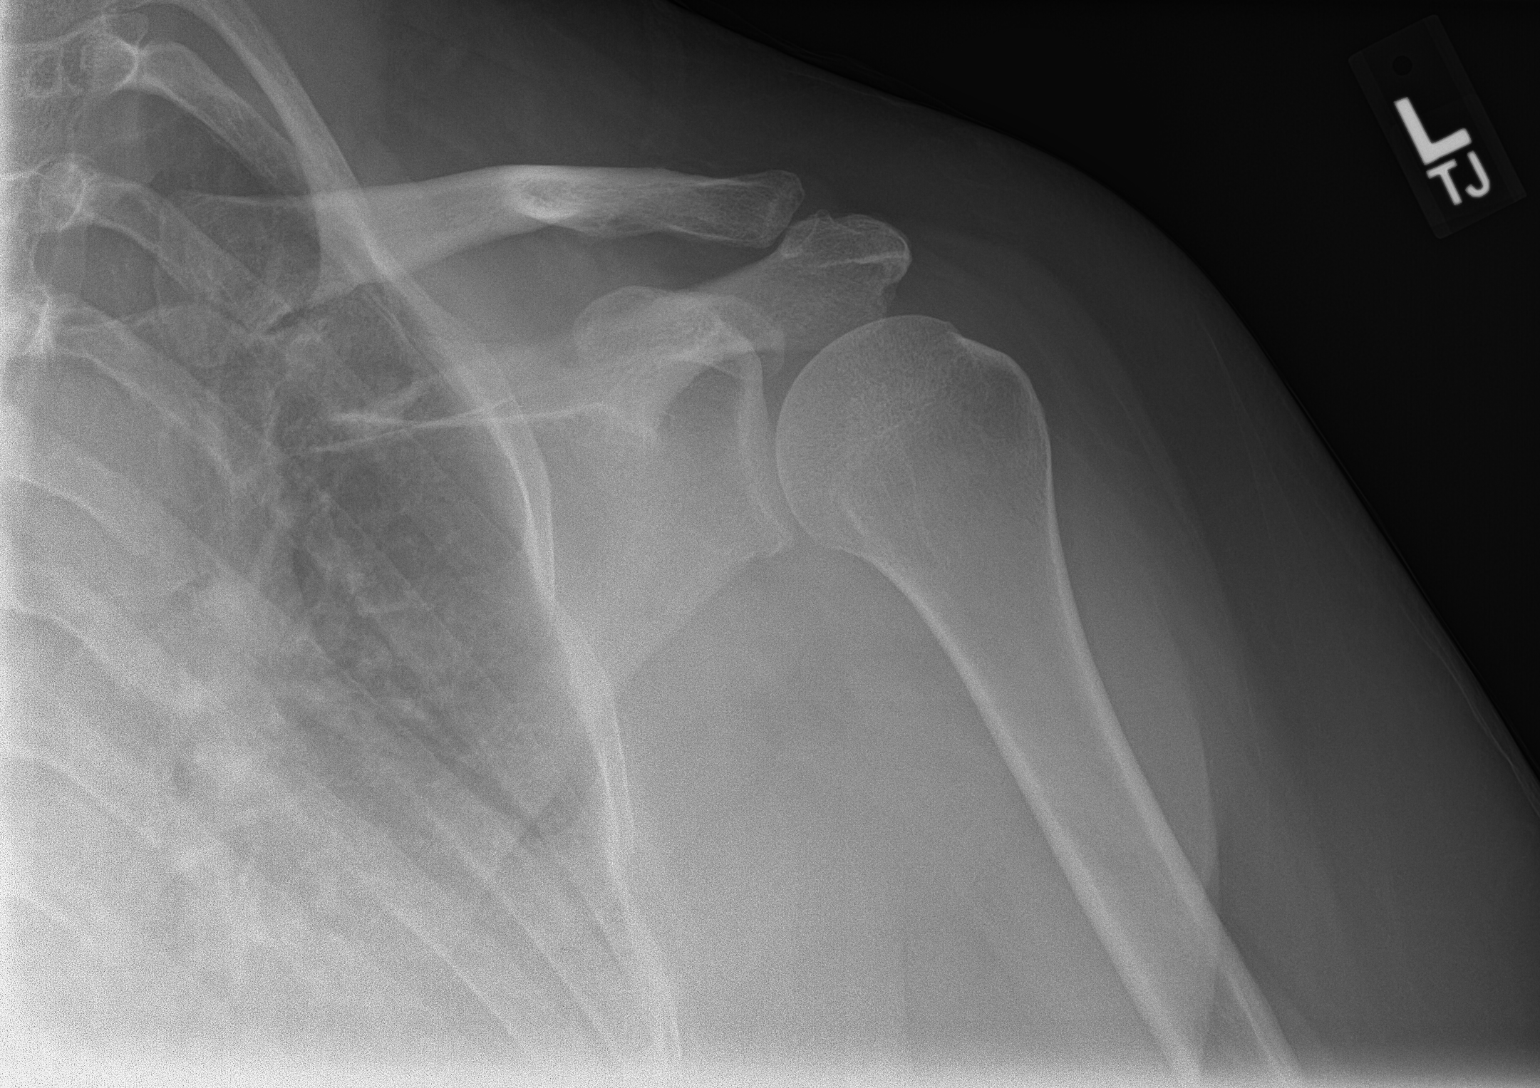

[shoulder obl]
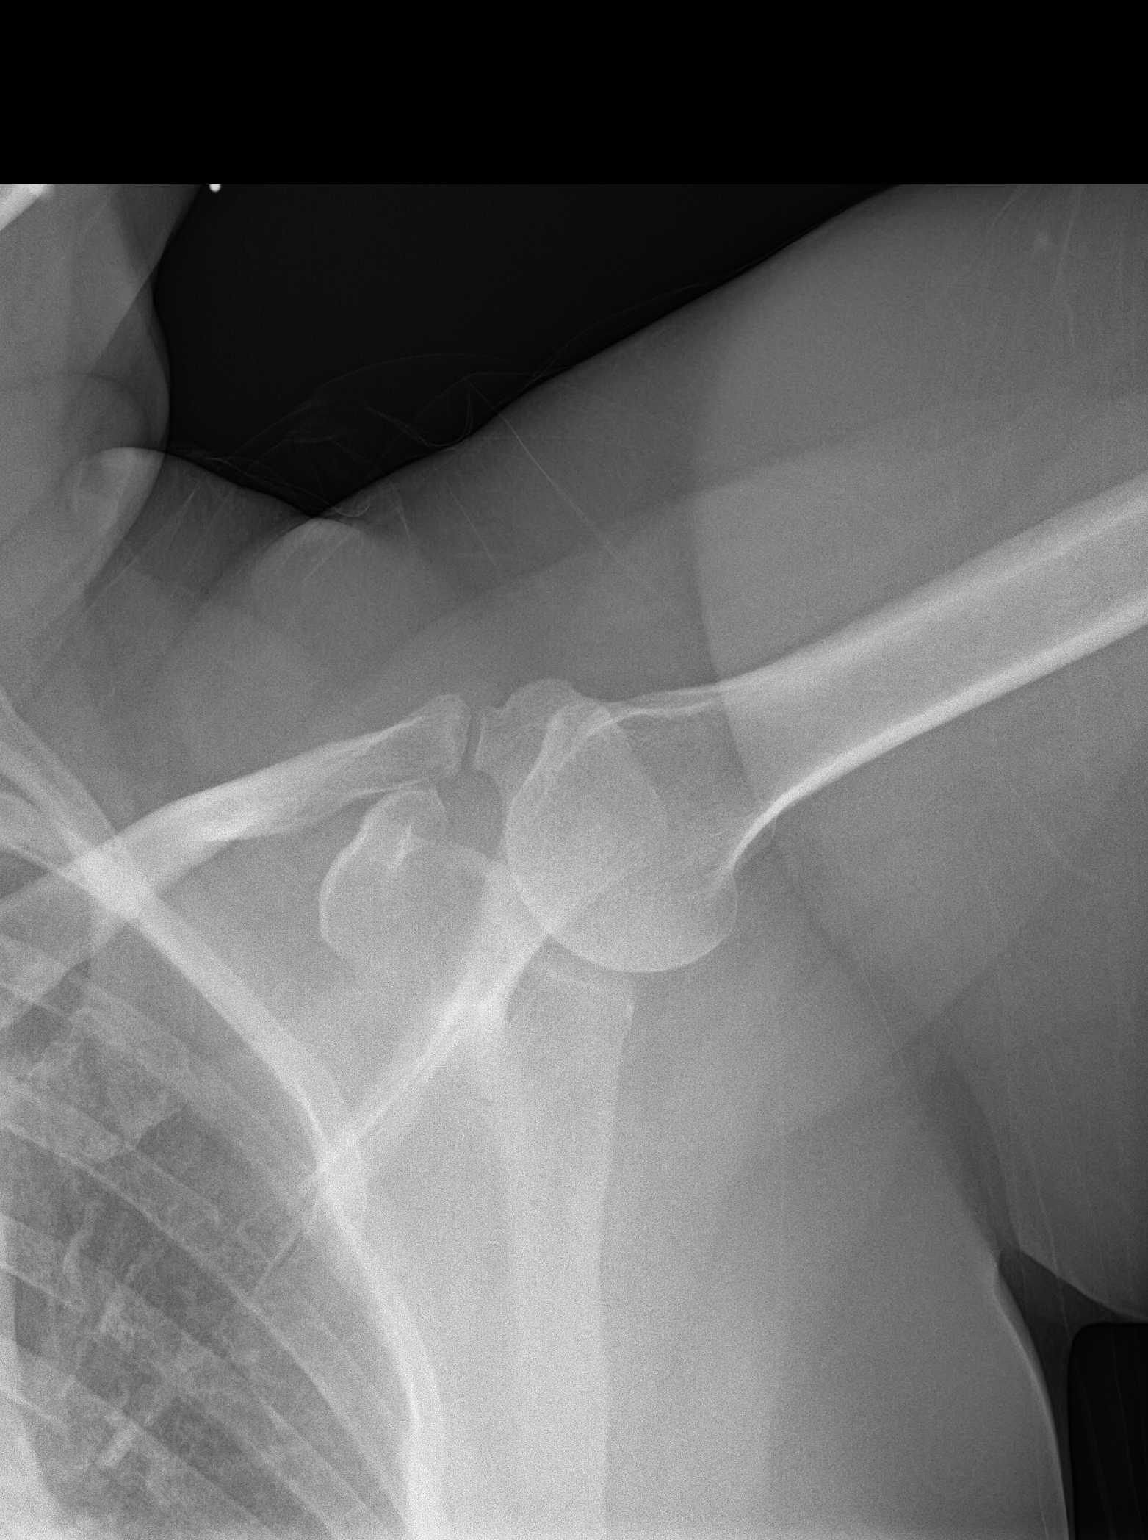

[shoulder y-view]
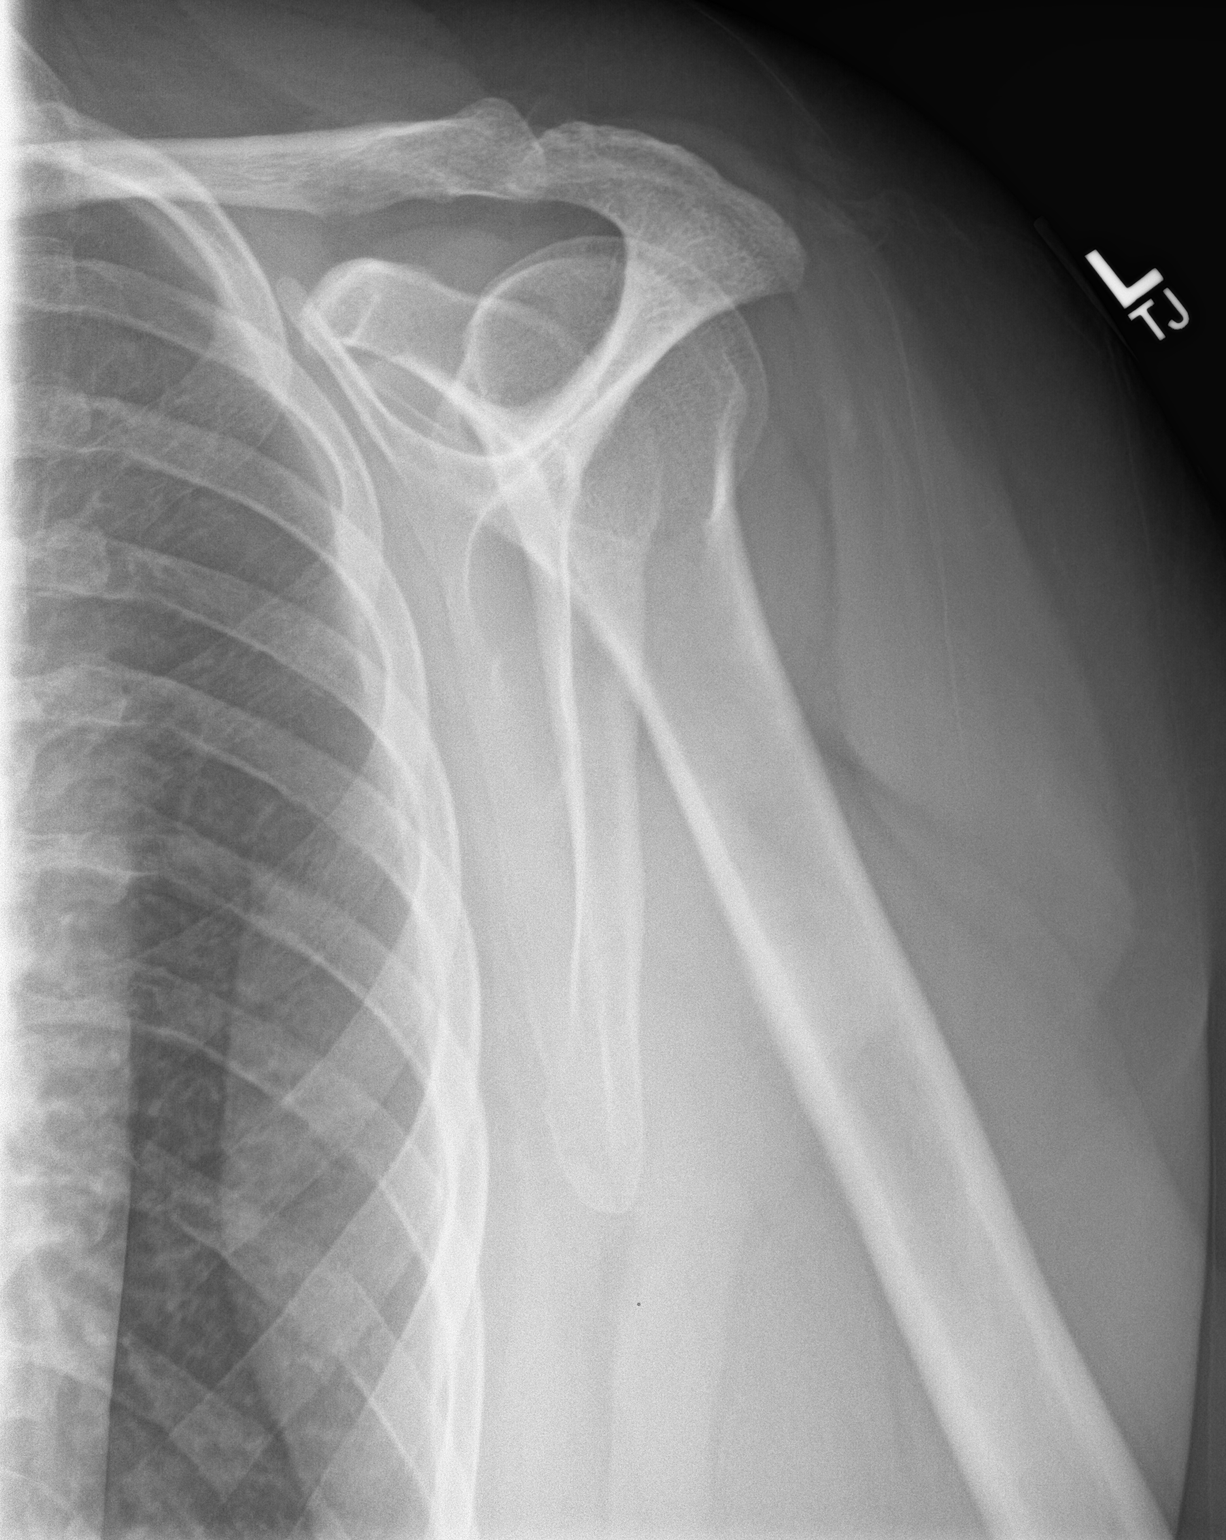

[3 of 3 positions shown; findings below may reference images not displayed]

FINDINGS: No evidence of acute, subacute or healed fracture. Glenohumeral
joint anatomically aligned with well-preserved joint space.
Subacromial space well-preserved. Acromioclavicular joint
anatomically aligned without significant degenerative changes. Well
preserved bone mineral density. No intrinsic osseous abnormality.
IMPRESSION: Normal examination.

## 2020-08-12 DIAGNOSIS — Z20828 Contact with and (suspected) exposure to other viral communicable diseases: Secondary | ICD-10-CM | POA: Diagnosis not present

## 2020-08-17 IMAGING — MG DIGITAL SCREENING BILAT W/ TOMO W/ CAD
8 series · 8 of 24 positions shown · non-contrast
Comparison: Previous exam(s).

CLINICAL DATA: Screening.

EXAM:
DIGITAL SCREENING BILATERAL MAMMOGRAM WITH TOMO AND CAD

[R CC synth-2D]
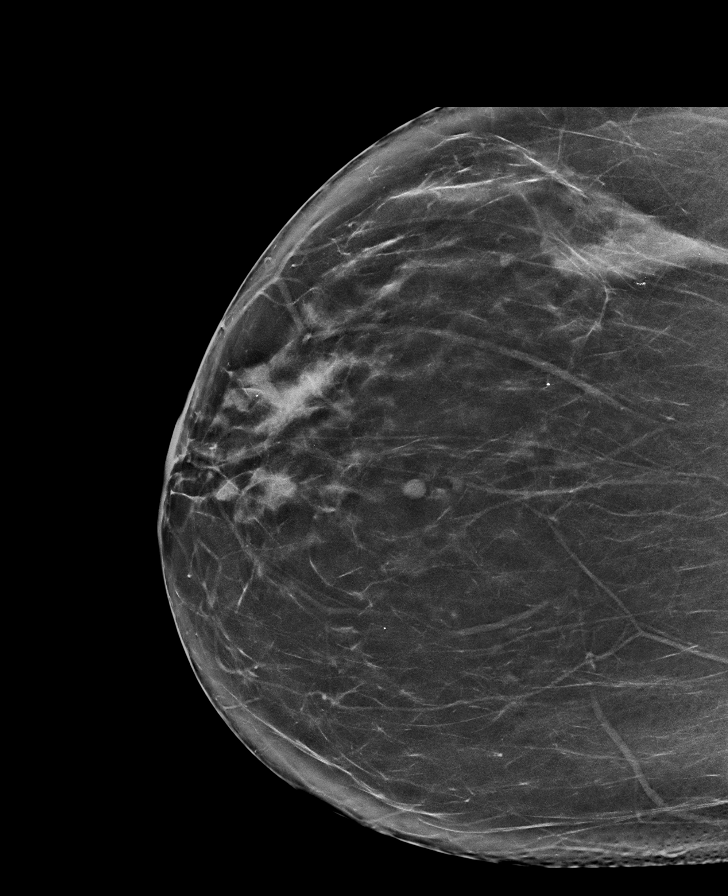

[L CC synth-2D]
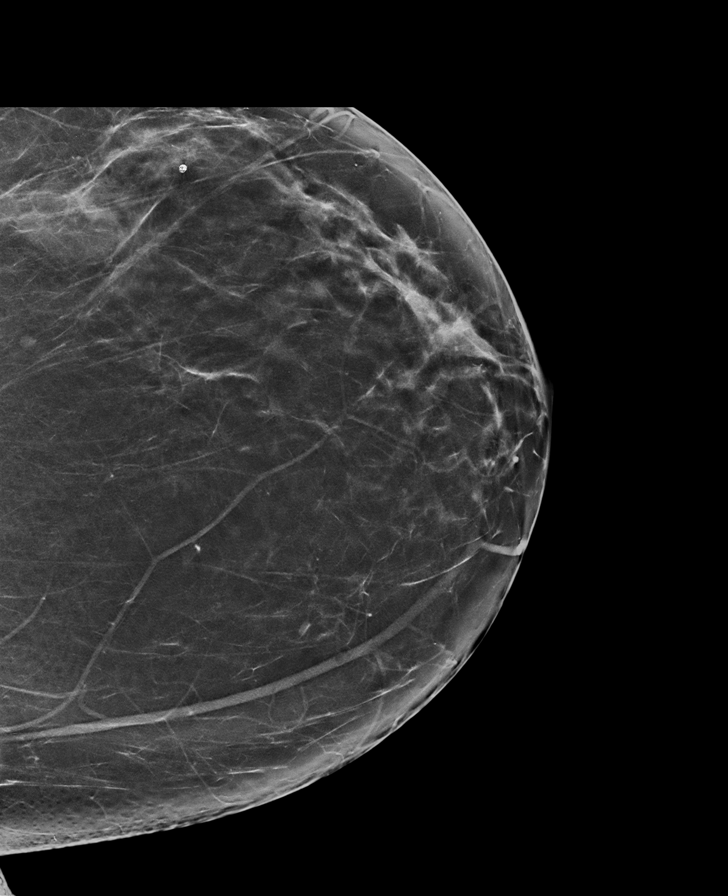

[R MLO synth-2D]
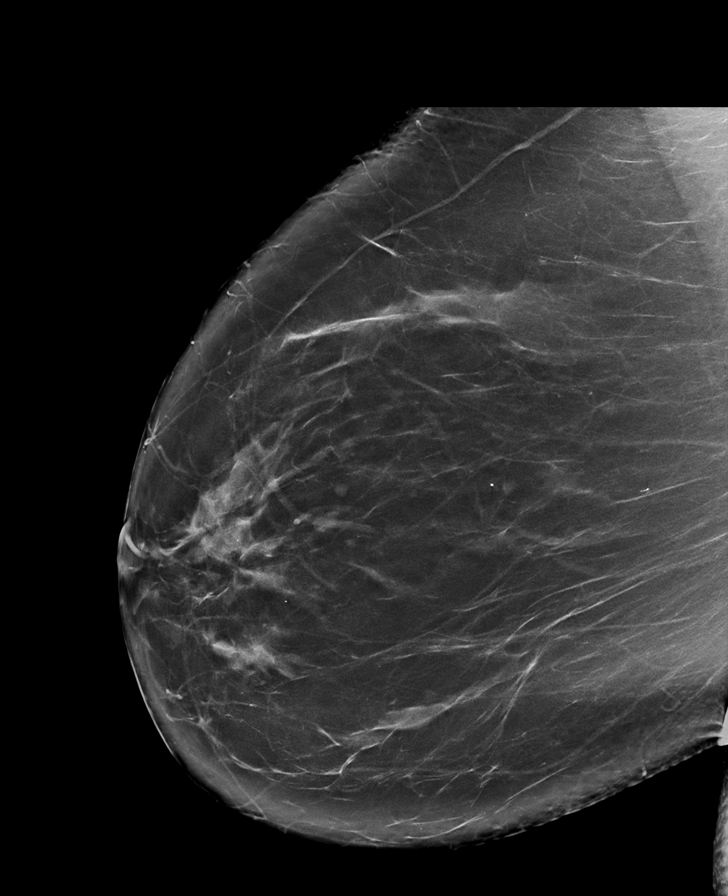

[L MLO synth-2D]
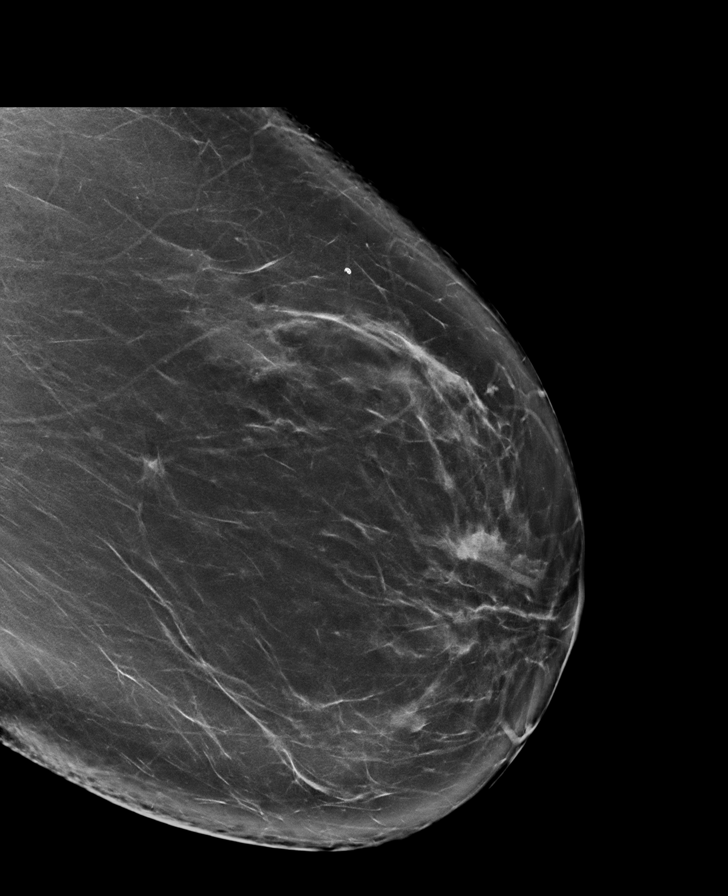

[R MLO tomo · tomo slice 52/103.0]
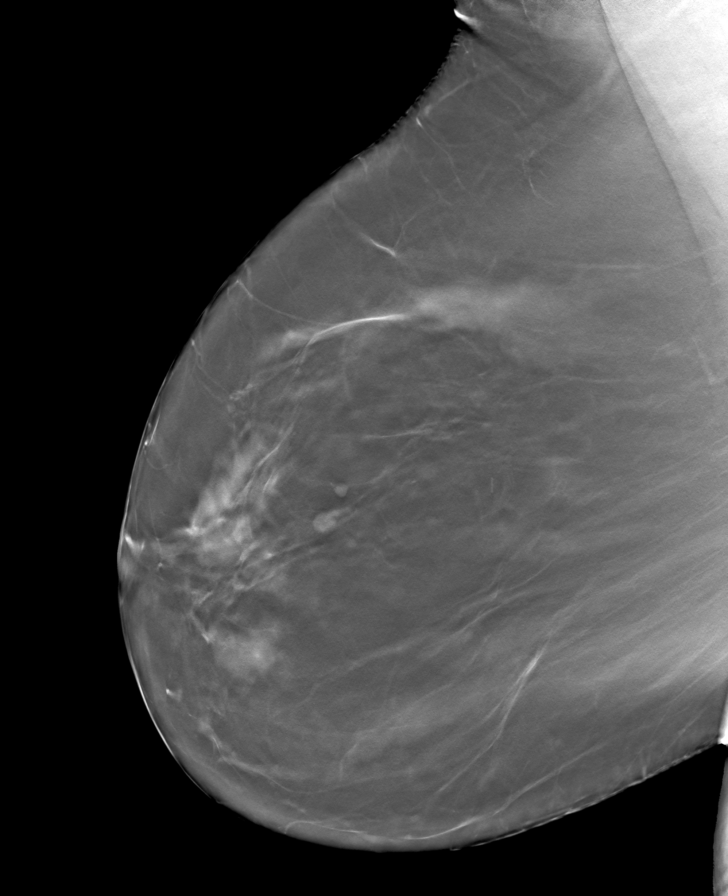

[R CC tomo · tomo slice 42/83.0]
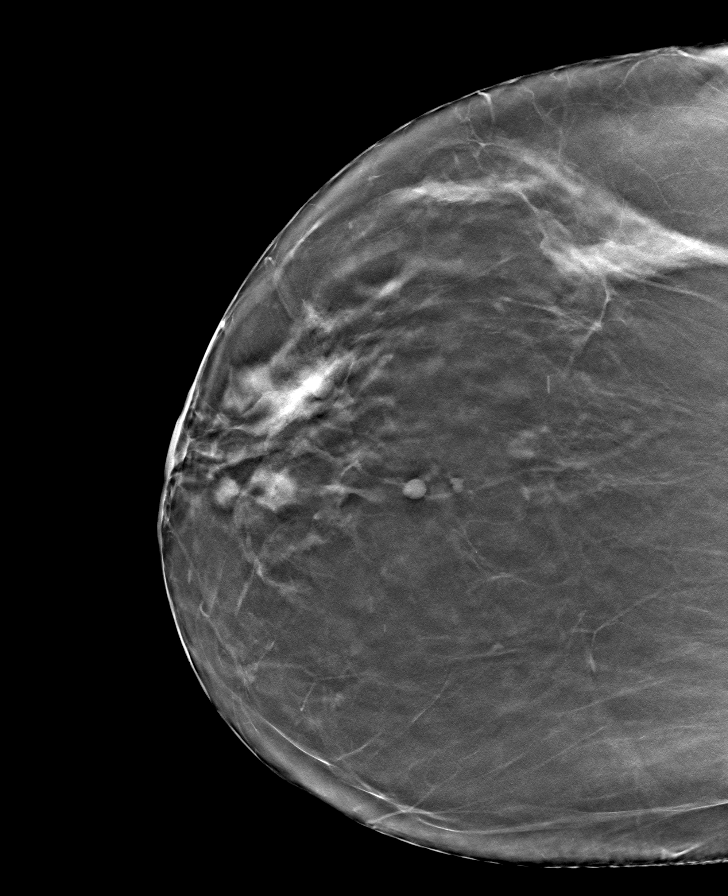

[L MLO tomo · tomo slice 49/97.0]
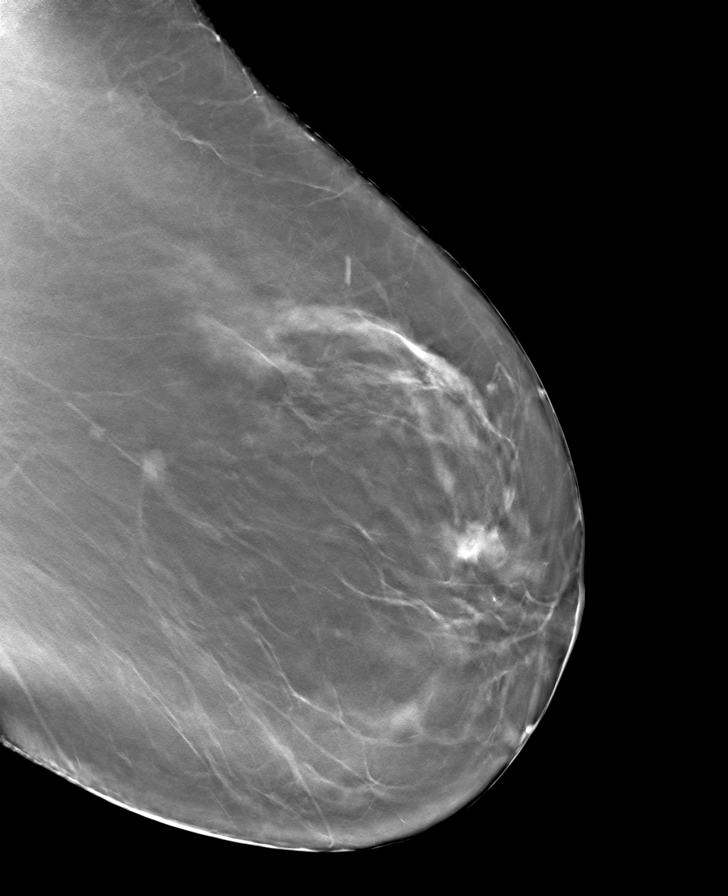

[L CC tomo · tomo slice 42/83.0]
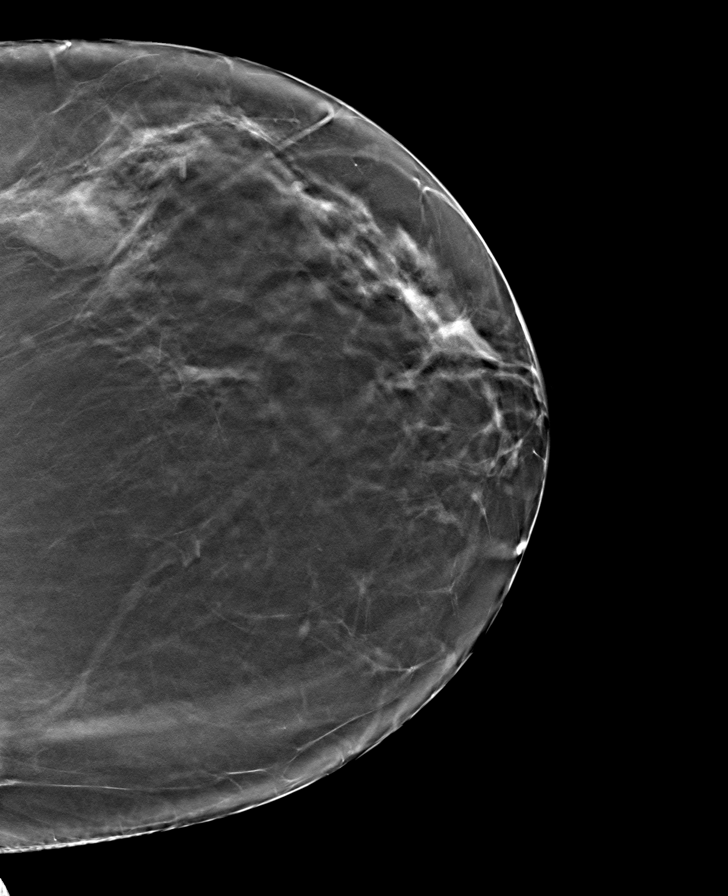

[8 of 24 positions shown; findings below may reference images not displayed]

ACR Breast Density Category b: There are scattered areas of
fibroglandular density.
FINDINGS: There are no findings suspicious for malignancy. Images were
processed with CAD.
IMPRESSION: No mammographic evidence of malignancy. A result letter of this
screening mammogram will be mailed directly to the patient.

RECOMMENDATION:
Screening mammogram in one year. (Code:[id number])

BI-RADS CATEGORY  1: Negative.

## 2020-11-08 DIAGNOSIS — Z20822 Contact with and (suspected) exposure to covid-19: Secondary | ICD-10-CM | POA: Diagnosis not present

## 2020-11-11 ENCOUNTER — Telehealth: Payer: Self-pay | Admitting: Family Medicine

## 2020-11-11 NOTE — Telephone Encounter (Signed)
Thanks for letting me know  Isolate and treat symptoms/stay hydrated  If suddenly worse or sob - go to ER  Keep [place] posted Please check in on monday

## 2020-11-11 NOTE — Telephone Encounter (Signed)
Called pt and she stated that she wanted to let Dr. Milinda Antis know that she tested + for covid and she is having mild symptoms

## 2020-11-11 NOTE — Telephone Encounter (Signed)
Pt notified of Dr. Tower's comments and verbalized understanding  

## 2020-11-15 NOTE — Telephone Encounter (Signed)
Left VM requesting pt to call the office back and update us on how she's feeling 

## 2020-11-16 NOTE — Telephone Encounter (Signed)
Left VM requesting pt to call the office back and update [place]

## 2020-12-05 ENCOUNTER — Other Ambulatory Visit: Payer: Self-pay | Admitting: Family Medicine

## 2020-12-22 DIAGNOSIS — R7303 Prediabetes: Secondary | ICD-10-CM | POA: Diagnosis not present

## 2021-01-28 DIAGNOSIS — Z1329 Encounter for screening for other suspected endocrine disorder: Secondary | ICD-10-CM | POA: Diagnosis not present

## 2021-01-28 DIAGNOSIS — Z13228 Encounter for screening for other metabolic disorders: Secondary | ICD-10-CM | POA: Diagnosis not present

## 2021-01-28 DIAGNOSIS — E669 Obesity, unspecified: Secondary | ICD-10-CM | POA: Diagnosis not present

## 2021-01-28 DIAGNOSIS — Z131 Encounter for screening for diabetes mellitus: Secondary | ICD-10-CM | POA: Diagnosis not present

## 2021-02-08 DIAGNOSIS — M25461 Effusion, right knee: Secondary | ICD-10-CM | POA: Diagnosis not present

## 2021-02-11 DIAGNOSIS — M25429 Effusion, unspecified elbow: Secondary | ICD-10-CM | POA: Diagnosis not present

## 2021-02-11 DIAGNOSIS — Z9049 Acquired absence of other specified parts of digestive tract: Secondary | ICD-10-CM | POA: Diagnosis not present

## 2021-02-11 DIAGNOSIS — M25561 Pain in right knee: Secondary | ICD-10-CM | POA: Diagnosis not present

## 2021-02-11 DIAGNOSIS — D75A Glucose-6-phosphate dehydrogenase (G6PD) deficiency without anemia: Secondary | ICD-10-CM | POA: Diagnosis not present

## 2021-02-11 DIAGNOSIS — M545 Low back pain, unspecified: Secondary | ICD-10-CM | POA: Diagnosis not present

## 2021-02-11 DIAGNOSIS — M25562 Pain in left knee: Secondary | ICD-10-CM | POA: Diagnosis not present

## 2021-02-11 DIAGNOSIS — G8929 Other chronic pain: Secondary | ICD-10-CM | POA: Diagnosis not present

## 2021-02-11 DIAGNOSIS — M0239 Reiter's disease, multiple sites: Secondary | ICD-10-CM | POA: Diagnosis not present

## 2021-02-11 DIAGNOSIS — M25439 Effusion, unspecified wrist: Secondary | ICD-10-CM | POA: Diagnosis not present

## 2021-02-11 DIAGNOSIS — Z791 Long term (current) use of non-steroidal anti-inflammatories (NSAID): Secondary | ICD-10-CM | POA: Diagnosis not present

## 2021-09-01 DIAGNOSIS — Z791 Long term (current) use of non-steroidal anti-inflammatories (NSAID): Secondary | ICD-10-CM | POA: Diagnosis not present

## 2021-09-01 DIAGNOSIS — M0239 Reiter's disease, multiple sites: Secondary | ICD-10-CM | POA: Diagnosis not present

## 2021-12-25 ENCOUNTER — Other Ambulatory Visit: Payer: Self-pay

## 2021-12-25 ENCOUNTER — Emergency Department
Admission: EM | Admit: 2021-12-25 | Discharge: 2021-12-25 | Disposition: A | Discharge status: Home / Self Care | Payer: BC Managed Care – PPO | Attending: Emergency Medicine | Admitting: Emergency Medicine

## 2021-12-25 DIAGNOSIS — B029 Zoster without complications: Secondary | ICD-10-CM

## 2021-12-25 DIAGNOSIS — R21 Rash and other nonspecific skin eruption: Secondary | ICD-10-CM | POA: Diagnosis not present

## 2021-12-25 MED ORDER — VALACYCLOVIR HCL 1 G PO TABS: 1000.0000 mg | ORAL_TABLET | Freq: Three times a day (TID) | ORAL | 0 refills | Status: DC

## 2021-12-25 NOTE — ED Notes (Signed)
E sig pad not working pt gives verbal consent to treat

## 2021-12-25 NOTE — Discharge Instructions (Addendum)
You were seen today for rash.  Your rash is consistent with shingles.  I have prescribed you an antiviral medicine to take every 8 hours for 7 days.  Please stay away from immunocompromised adults, pregnant women and infants less than 1 year.  Once your rash has resolved you should consider getting your shingles vaccine.

## 2021-12-25 NOTE — ED Notes (Signed)
Pt states she has a rash between her breast and 2 places on her back

## 2021-12-25 NOTE — ED Provider Notes (Signed)
° °  [facility] Provider Note    Event Date/Time   First MD Initiated Contact with Patient 12/25/21 1915     (approximate)   History   Rash   HPI  [name] is a 56 y.o. female presents to the ER today with complaint of a rash.  She reports the rash started today about 45 minutes to 1 hour ago.  She reports the rash is located in between her breast and on her midline back.  The rash is prickly but does not itch, burn or tingle.  She denies changes in diet or medications.  She denies changes in soaps, lotions or detergents.  She has not taken any medications OTC for this.  She has not had her shingles shot.      Physical Exam   Triage Vital Signs: ED Triage Vitals [12/25/21 1907]  Enc Vitals Group     BP (!) 174/84     Pulse Rate 74     Resp 16     Temp 98.6 F (37 C)     Temp Source Oral     SpO2 97 %     Weight 265 lb (120.2 kg)     Height 5\' 9"  (1.753 m)     Head Circumference      Peak Flow      Pain Score 5     Pain Loc      Pain Edu?      Excl. in GC?     Most recent vital signs: Vitals:   12/25/21 1907  BP: (!) 174/84  Pulse: 74  Resp: 16  Temp: 98.6 F (37 C)  SpO2: 97%     General: Awake, no distress.  CV:  Normal rate. Resp:  Normal effort.   Skin:  Grouped vesicular lesions on erythematous base noted in between the breast overlying the sternum.  Similar-appearing grouped vesicular lesions on erythematous base noted of the midline thoracic back.   ED Results / Procedures / Treatments     IMPRESSION / MDM / ASSESSMENT AND PLAN / ED COURSE  I reviewed the triage vital signs and the nursing notes.  Rash:  Differential diagnosis includes, but is not limited to shingles, contact dermatitis, allergic dermatitis, hives  Rash suggestive of herpes zoster Rx for Valtrex 1 g 3 times daily x7 days She has no need for nerve pain medication at this time Advised her to stay away from immunocompromised people, infants less  than 1 year, pregnant women Advised her to follow-up with her PCP to get her shingles shot  FINAL CLINICAL IMPRESSION(S) / ED DIAGNOSES   Final diagnoses:  Herpes zoster without complication     Rx / DC Orders   ED Discharge Orders          Ordered    valACYclovir (VALTREX) 1000 MG tablet  3 times daily        12/25/21 1921             Note:  This document was prepared using Dragon voice recognition software and may include unintentional dictation errors.    02/22/22, NP 12/25/21 02/22/22, MD 12/25/21 [phone]

## 2021-12-25 NOTE — ED Triage Notes (Signed)
Pt presents to ER c/o rash on stomach and mid back that she noticed around 45 minutes ago.  Pt noted to have red blotches on chest and back.  Pt states she has not had any new foods recently.  Pt took 25 mg of benadryl pta.  Pt A&O x4 at this time. Speech clear.  No oral swelling or hives noted.

## 2021-12-26 ENCOUNTER — Telehealth: Payer: Self-pay

## 2021-12-26 MED ORDER — TRAMADOL HCL 50 MG PO TABS: 50.0000 mg | ORAL_TABLET | Freq: Three times a day (TID) | ORAL | 0 refills | Status: AC | PRN

## 2021-12-26 NOTE — Telephone Encounter (Signed)
I sent tramadol to her pharmacy  It is a stronger pain med than otc and can cause sedation and dizziness/falls so use with caution  Is also habit forming if she uses long term  Can also cause constipation so use a stool softener if needed   Take up to three times daily  Update if not helpful  Please keep [place] posted [id number]

## 2021-12-26 NOTE — Telephone Encounter (Signed)
I spoke with pt; pt said she has stabby pain where blisters are on left side of chest and around to the back. Pt pain level now is 6 after pt taking Tylenol which helped slightly. Pt request pain med sent to [facility] [address]. Pt did not want to schedule appt at [facility] since was just seen at [facility] ED.Pt request cb after Dr Glori Bickers reviews this note. Sending note to Dr Glori Bickers and [place] CMA.

## 2021-12-26 NOTE — Telephone Encounter (Signed)
Pt notified of Dr. Royden Purl comments/ instructions and Rx was sent to pharmacy, pt will keep [place] posted

## 2021-12-26 NOTE — Telephone Encounter (Signed)
[facility] Night - Client TELEPHONE ADVICE RECORD [facility] [name] Gender: Female DOB: 1966-10-08 Age: 56 Y 3 M 11 D Return Phone Number: [phone] (Primary) Address: City/ State/ Zip: [place]  [address] Client [name] Night - Client Client Site  [name] - Night Provider Tower, Idamae Schuller - MD Contact Type Call Who Is Calling Patient / Member / Family / Caregiver Call Type Triage / Clinical Relationship To Patient Self Return Phone Number [phone] (Primary) Chief Complaint CHEST PAIN - pain, pressure, heaviness or tightness Reason for Call Symptomatic / Request for Health Information Initial Comment Caller states she has shingles. She states she has stabbing pain near her chest. Translation No Nurse Assessment Nurse: Berna Bue, RN, Megan Date/Time (Eastern Time): 12/26/2021 7:25:37 AM Confirm and document reason for call. If symptomatic, describe symptoms. ---Caller states she has shingles. She states she has stabbing pain near her chest where the bumps are. It was just on her back and front of chest, but now it has spread. The pain is 7 or 8/10. The rash is on left side. Does the patient have any new or worsening symptoms? ---Yes Will a triage be completed? ---Yes Related visit to physician within the last 2 weeks? ---Yes Does the PT have any chronic conditions? (i.e. diabetes, asthma, this includes High risk factors for pregnancy, etc.) ---Yes List chronic conditions. ---arthritis, fibromyalgia Is the patient pregnant or possibly pregnant? (Ask all females between the ages of 25-55) ---No Is this a behavioral health or substance abuse call? ---No Guidelines Guideline Title Affirmed Question Affirmed Notes Nurse Date/Time [name] Time) Shingles (Zoster) SEVERE pain (e.g., excruciating) Berna Bue, RN, Megan 12/26/2021 7:28:47 AM Disp. Time [name] Time)  Disposition Final User 12/26/2021 7:22:34 AM Send to Urgent Queue Mellody Dance PLEASE NOTE: All timestamps contained within this report are represented as [place] Standard Time. CONFIDENTIALTY NOTICE: This fax transmission is intended only for the addressee. It contains information that is legally privileged, confidential or otherwise protected from use or disclosure. If you are not the intended recipient, you are strictly prohibited from reviewing, disclosing, copying using or disseminating any of this information or taking any action in reliance on or regarding this information. If you have received this fax in error, please notify us immediately by telephone so that we can arrange for its return to [place]. Phone: [phone], Toll-Free: [phone], Fax: [phone] Page: 2 of 2 Call Id: [id number] 12/26/2021 7:32:43 AM See PCP within 24 Hours Yes Berna Bue, RN, Aundra Millet Caller Disagree/Comply Comply Caller Understands Yes PreDisposition Call Doctor Care Advice Given Per Guideline SEE PCP WITHIN 24 HOURS: * ACETAMINOPHEN - REGULAR STRENGTH TYLENOL: Take 650 mg (two 325 mg pills) by mouth every 4 to 6 hours as needed. Each Regular Strength Tylenol pill has 325 mg of acetaminophen. The most you should take is 10 pills a day (3,250 mg total). Note: In [place], the maximum is 12 pills a day (3,900 mg total). * IBUPROFEN (E.G., MOTRIN, ADVIL): Take 400 mg (two 200 mg pills) by mouth every 6 hours. The most you should take is 6 pills a day (1,200 mg total). PAIN MEDICINES: * For pain relief, you can take either acetaminophen, ibuprofen, or naproxen. * CAUTION: Do not take acetaminophen if you have liver disease. * Avoid contact with pregnant women. Avoid contact with anyone who has a weakened immune system (immunocompromised) (e.g., HIV positive, cancer chemotherapy, chronic steroid treatment, splenectomy, organ transplant). CALL BACK IF: * Fever over  100.4 F (38.0 C) * You become worse CARE ADVICE given per  Shingles (Adult) guideline. * IF OFFICE WILL BE OPEN: You need to be examined within the next 24 hours. Call your doctor (or NP/PA) when the office opens and make an appointment. Comments User: Julie Amble, RN Date/Time [name] Time): 12/26/2021 7:28:13 AM she was given acyclovir; pharmacy was closed; will pick up medication this morning; was not prescribed pain medication. User: Julie Amble, RN Date/Time [name] Time): 12/26/2021 7:32:42 AM Caller was diagnosed with shingles at ER last night. Referrals REFERRED TO PCP OFFIC

## 2021-12-26 NOTE — Telephone Encounter (Signed)
[facility] Night - Client TELEPHONE ADVICE RECORD [facility] [name] Gender: Female DOB: 26-Apr-1966 Age: 56 Y 3 M 10 D Return Phone Number: [phone] (Primary) Address: City/ State/ Zip: [place]  [address] Client [facility] Night - Client Client Site [place] Provider Tower, Roque Lias - MD Contact Type Call Who Is Calling Patient / Member / Family / Caregiver Call Type Triage / Clinical Relationship To Patient Self Return Phone Number [phone] (Primary) Chief Complaint Rash - Widespread Reason for Call Symptomatic / Request for [place] states she has broke out suddenly between her breasts and her back. Translation No Nurse Assessment Nurse: Durene Cal, RN, Brandi Date/Time (Eastern Time): 12/25/2021 6:08:40 PM Confirm and document reason for call. If symptomatic, describe symptoms. ---Caller states she has broke out suddenly between her breasts and her back. Reports pimple like the back and more hives to the chest. Does the patient have any new or worsening symptoms? ---Yes Will a triage be completed? ---Yes Related visit to physician within the last 2 weeks? ---N/A Does the PT have any chronic conditions? (i.e. diabetes, asthma, this includes High risk factors for pregnancy, etc.) ---Yes List chronic conditions. ---arthritis ; naproxen Is the patient pregnant or possibly pregnant? (Ask all females between the ages of 69-55) ---No Is this a behavioral health or substance abuse call? ---No Guidelines Guideline Title Affirmed Question Affirmed Notes Nurse Date/Time (Eastern Time) Hives Patient sounds very sick or weak to the triager Durene Cal, RN, [place] 12/25/2021 6:11:20 PM Disp. Time Eilene Ghazi Time) Disposition Final User 12/25/2021 6:16:26 PM Go to ED Now (or PCP triage) Yes Durene Cal, RN, Brandi PLEASE NOTE: All  timestamps contained within this report are represented as [place] Standard Time. CONFIDENTIALTY NOTICE: This fax transmission is intended only for the addressee. It contains information that is legally privileged, confidential or otherwise protected from use or disclosure. If you are not the intended recipient, you are strictly prohibited from reviewing, disclosing, copying using or disseminating any of this information or taking any action in reliance on or regarding this information. If you have received this fax in error, please notify us immediately by telephone so that we can arrange for its return to [place]. Phone: [phone], Toll-Free: [phone], Fax: [phone] Page: 2 of 2 Call Id: [id number] [place] Disagree/Comply Comply Caller Understands Yes PreDisposition Did not know what to do Care Advice Given Per Guideline GO TO ED NOW (OR PCP TRIAGE): * IF NO PCP (PRIMARY CARE PROVIDER) SECOND-LEVEL TRIAGE: You need to be seen within the next hour. Go to the [facility] at _____________ [place] as soon as you can. Comments User: Ermalene Postin, RN Date/Time (Eastern Time): 12/25/2021 6:16:09 PM Reports the chest has a hive, but she feels like the back might be shingles due to the discomfort associated with touching them. User: Ermalene Postin, RN Date/Time Eilene Ghazi Time): 12/25/2021 6:16:43 PM Encouraged benadryl and pt to take pictures of rash. Referrals GO TO FACILITY UNDECIDE

## 2022-01-05 ENCOUNTER — Ambulatory Visit: Payer: BC Managed Care – PPO | Admitting: Nurse Practitioner

## 2022-01-05 ENCOUNTER — Encounter: Payer: Self-pay | Admitting: Nurse Practitioner

## 2022-01-05 ENCOUNTER — Other Ambulatory Visit: Payer: Self-pay

## 2022-01-05 VITALS — BP 130/81 | HR 74 | Temp 98.5°F | Ht 68.0 in | Wt 278.8 lb

## 2022-01-05 DIAGNOSIS — Z791 Long term (current) use of non-steroidal anti-inflammatories (NSAID): Secondary | ICD-10-CM | POA: Insufficient documentation

## 2022-01-05 DIAGNOSIS — M797 Fibromyalgia: Secondary | ICD-10-CM | POA: Diagnosis not present

## 2022-01-05 DIAGNOSIS — M0239 Reiter's disease, multiple sites: Secondary | ICD-10-CM

## 2022-01-05 DIAGNOSIS — Z7689 Persons encountering health services in other specified circumstances: Secondary | ICD-10-CM

## 2022-01-05 DIAGNOSIS — B029 Zoster without complications: Secondary | ICD-10-CM | POA: Insufficient documentation

## 2022-01-05 DIAGNOSIS — N951 Menopausal and female climacteric states: Secondary | ICD-10-CM

## 2022-01-05 DIAGNOSIS — Z8616 Personal history of COVID-19: Secondary | ICD-10-CM | POA: Insufficient documentation

## 2022-01-05 NOTE — Progress Notes (Signed)
New Patient Office Visit  Subjective:  Patient ID: [name], female    DOB: 1966-03-27  Age: 56 y.o. MRN: [id number]  CC:  Chief Complaint  Patient presents with   Establish Care    Patient is here to establish care.    Menopause   Herpes Zoster    Patient states she recently just got over a Shingles flare up. Patient states she has finished her course of treatment. Patient states she experienced some chest pain discomfort when she had her flare and went to the ER and was informed by the provider on duty, that the pain was associated with her shingles flare up due to the flare up it was irritating her nerve and causing nerve pain.    HPI [name] presents for new patient visit to establish care.  Introduced to Designer, jewellery role and practice setting.  All questions answered.  Discussed provider/patient relationship and expectations. Previously followed by Dr. Glori Bickers.  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  Was on Ozempic and had good results with this, were to send Trulicity, but has not received. Taking for weight loss -- via online program.    History of Covid in 2020 with long Covid symptoms for approx one year, now improved.  MENOPAUSAL SYMPTOMS Started about 2 years ago -- weight gain, feeling off, decreased libido.  No cycle for 2 years.  With birth control in past did have migraines, but has not had in years.   Gravida/Para: 4/3 Duration: uncontrolled Symptom severity: moderate Hot flashes: no Night sweats: no Sleep disturbances: yes Vaginal dryness: yes Dyspareunia:no Decreased libido: yes Emotional lability: no -- feels disconnected Stress incontinence: yes Previous HRT/pharmacotherapy: no Hysterectomy: yes Absolute Contraindications to Hormonal Therapy:     Undiagnosed vaginal bleeding: no    Breast cancer: no    Endometrial cancer: no    Coronary disease: no    Cerebrovascular disease: no    Venous thromboembolic disease: no    SHINGLES Diagnosed on 12/25/21 from sternal area to around left breast and around to back.  Used St. John's Wort -- this helped with discomfort.  Took Valtrex and Tramadol. Duration: 12/25/21 Location: as above Painful:   a little bit Severity: mild  Paresthesia:  no Hyperesthesia: no Itching:  yes Burning:  no Oozing:  no Blisters:  no Fevers:  no History of the same:  no Alleviating factors: as above treatments  Status: better Treatments attempted: as above  REACTIVE ARTHRITIS & FIBROMYALGIA: Followed by rheumatology, last visit 09/01/21.  Uses Indomethacin as needed and Naproxen.   Duration:  chronic Pain: yes Symmetric: yes  7/10 Quality: dull, aching, and throbbing Frequency: intermittent Context:  fluctuating Decreased function/range of motion: yes Erythema: none Swelling: knees at time Heat or warmth: none Morning stiffness: yes Aggravating factors:  Alleviating factors:  Relief with NSAIDs?: No NSAIDs Taken Treatments attempted: infra red pad, red light therapy Involved Joints:     Hands: yes bilateral    Wrists: none    Elbows: none    Shoulders: none    Back: yes     Hips: none    Knees: yes bilateral    Ankles: none    Feet: none  Past Medical History:  Diagnosis Date   Concussion 2008   Fibromyalgia    Hx of migraines    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[place])    Viral meningitis     Past Surgical History:  Procedure Laterality Date   LAPAROSCOPIC APPENDECTOMY N/A 10/02/2019  Procedure: APPENDECTOMY LAPAROSCOPIC;  Surgeon: Olean Ree, MD;  Location: [facility] ORS;  Service: General;  Laterality: N/A;    Family History  Problem Relation Age of Onset   Hyperlipidemia Mother    Heart Problems Father    Healthy Sister    Autism spectrum disorder Son    Autism spectrum disorder Son    Autism spectrum disorder Son    Breast cancer Neg Hx     Social History   Socioeconomic History   Marital status: Married    Spouse name: Not on file   Number of  children: Not on file   Years of education: Not on file   Highest education level: Not on file  Occupational History   Occupation: [employer] - in [place] Use   Smoking status: Former    Packs/day: 0.25    Years: 3.00    Pack years: 0.75    Types: Cigarettes    Quit date: 08/25/2013    Years since quitting: 8.3   Smokeless tobacco: Never   Tobacco comments:    no smoking in 5 days  Substance and Sexual Activity   Alcohol use: Yes    Alcohol/week: 1.0 standard drink    Types: 1 Cans of beer per week    Comment: occ   Drug use: No   Sexual activity: Yes  Other Topics Concern   Not on file  Social History Narrative   Not on file   Social Determinants of Health   Financial Resource Strain: Low Risk    Difficulty of Paying Living Expenses: Not hard at all  Food Insecurity: No Food Insecurity   Worried About Charity fundraiser in the Last Year: Never true   [place] in the Last Year: Never true  Transportation Needs: No Transportation Needs   Lack of Transportation (Medical): No   Lack of Transportation (Non-Medical): No  Physical Activity: Sufficiently Active   Days of Exercise per Week: 5 days   Minutes of Exercise per Session: 30 min  Stress: No Stress Concern Present   Feeling of Stress : Not at all  Social Connections: Moderately Isolated   Frequency of Communication with Friends and Family: More than three times a week   Frequency of Social Gatherings with Friends and Family: More than three times a week   Attends Religious Services: Never   Marine scientist or Organizations: No   Attends Music therapist: Never   Marital Status: Married  Human resources officer Violence: Not At Risk   Fear of Current or Ex-Partner: No   Emotionally Abused: No   Physically Abused: No   Sexually Abused: No    ROS Review of Systems  Constitutional:  Negative for activity change, appetite change, diaphoresis, fatigue and fever.   Respiratory:  Negative for cough, chest tightness, shortness of breath and wheezing.   Cardiovascular:  Negative for chest pain, palpitations and leg swelling.  Gastrointestinal: Negative.   Endocrine: Negative.   Musculoskeletal:  Positive for arthralgias.  Neurological: Negative.   Psychiatric/Behavioral: Negative.     Objective:   Today's Vitals: BP 130/81    Pulse 74    Temp 98.5 F (36.9 C) (Oral)    Ht 5\' 8"  (1.727 m)    Wt 278 lb 12.8 oz (126.5 kg)    LMP 08/25/2015    SpO2 96%    BMI 42.39 kg/m   Physical Exam Vitals and nursing note reviewed.  Constitutional:  General: She is awake. She is not in acute distress.    Appearance: She is well-developed and well-groomed. She is obese. She is not ill-appearing or toxic-appearing.  HENT:     Head: Normocephalic.     Right Ear: Hearing normal.     Left Ear: Hearing normal.  Eyes:     General: Lids are normal.        Right eye: No discharge.        Left eye: No discharge.     Conjunctiva/sclera: Conjunctivae normal.     Pupils: Pupils are equal, round, and reactive to light.  Neck:     Thyroid: No thyromegaly.     Vascular: No carotid bruit.  Cardiovascular:     Rate and Rhythm: Normal rate and regular rhythm.     Heart sounds: Normal heart sounds. No murmur heard.   No gallop.  Pulmonary:     Effort: Pulmonary effort is normal. No accessory muscle usage or respiratory distress.     Breath sounds: Normal breath sounds.  Abdominal:     General: Bowel sounds are normal.     Palpations: Abdomen is soft.  Musculoskeletal:     Cervical back: Normal range of motion and neck supple.     Right lower leg: No edema.     Left lower leg: No edema.  Lymphadenopathy:     Head:     Right side of head: No submental, submandibular, tonsillar, preauricular or posterior auricular adenopathy.     Left side of head: No submental, submandibular, tonsillar, preauricular or posterior auricular adenopathy.     Cervical: No cervical  adenopathy.  Skin:    General: Skin is warm and dry.     Findings: Rash present.     Comments: Areas of rash present under bra line to mid chest, left upper chest, and mid upper back.  Rash (shingles rash) with crusting and healing present.  Neurological:     Mental Status: She is alert and oriented to person, place, and time.     Deep Tendon Reflexes: Reflexes are normal and symmetric.     Reflex Scores:      Brachioradialis reflexes are 2+ on the right side and 2+ on the left side.      Patellar reflexes are 2+ on the right side and 2+ on the left side. Psychiatric:        Attention and Perception: Attention normal.        Mood and Affect: Mood normal.        Speech: Speech normal.        Behavior: Behavior normal. Behavior is cooperative.        Thought Content: Thought content normal.    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  Main areas of discomfort are lower back, hands, and knees.  Followed by rheumatology.  Will continue this collaboration, recent notes and labs reviewed.  Continue current medication regimen as prescribed by them.        Relevant Medications   indomethacin (INDOCIN) 50 MG capsule     Other   Fibromyalgia    Chronic, ongoing.  Followed by rheumatology at this time.  Recent notes and labs reviewed.  Continue current medication regimen as prescribed by them.      Relevant Medications   indomethacin (INDOCIN) 50 MG capsule  History of 2019 novel coronavirus disease (COVID-19)    In 2020 with long haul symptoms for one year -- monitor closely for recurrence.      Menopausal symptoms    Last menstrual cycle 2 years ago.  Current vaginal dryness, decreased libido, sleep disturbance, and stress incontinence.  Educated her on menopause and discussed hormone replacement with her.  At this time she would like to focus on  herbal supplements, which is appropriate.  Recommend Replens OTC for vaginal dryness.  Obtain labs next visit.      Morbid obesity (HCC)    BMI 42.39.  Currently she is obtaining GLP1 treatment from online program.  Will consider taking this over in future as needed, possibly change to [facility] -- would need PA for this.  She has had success with GLP1.  Recommended eating smaller high protein, low fat meals more frequently and exercising 30 mins a day 5 times a week with a goal of 10-15lb weight loss in the next 3 months. Patient voiced their understanding and motivation to adhere to these recommendations.       NSAID long-term use    For her arthritic pain, as ordered by rheumatology -- continue to monitor this and monitor closely for GI bleed.      Shingles    Diagnosed 12/25/21.  At this time rash and symptoms are improving.  She is concerned about the location affected and long term affects.  Will plan on EKG at physical in upcoming weeks + check labs.      Other Visit Diagnoses     Encounter to establish care       New patient to clinic, introduced to clinic and office setting.       Outpatient Encounter Medications as of 01/05/2022  Medication Sig   indomethacin (INDOCIN) 50 MG capsule indomethacin 50 mg capsule   magnesium oxide (MAG-OX) 400 MG tablet Take 400 mg by mouth daily.   [DISCONTINUED] TRULICITY A999333 0000000 SOPN SMARTSIG:0.5 Milliliter(s) SUB-Q Once a Week   [DISCONTINUED] ibuprofen (ADVIL) 800 MG tablet Take 1 tablet (800 mg total) by mouth every 8 (eight) hours as needed.   [DISCONTINUED] metFORMIN (GLUCOPHAGE) 500 MG tablet Take 500 mg by mouth daily. (Patient not taking: Reported on 01/05/2022)   [DISCONTINUED] naproxen (NAPROSYN) 500 MG tablet TAKE 1 TABLET(500 MG) BY MOUTH TWICE DAILY WITH A MEAL AS NEEDED FOR PAIN   [DISCONTINUED] valACYclovir (VALTREX) 1000 MG tablet Take 1 tablet (1,000 mg total) by mouth 3 (three) times daily for 7 days.   No  facility-administered encounter medications on file as of 01/05/2022.    Follow-up: Return in about 6 weeks (around 02/16/2022) for Annual physical.   Venita Lick, NP

## 2022-01-05 NOTE — Assessment & Plan Note (Signed)
Diagnosed 12/25/21.  At this time rash and symptoms are improving.  She is concerned about the location affected and long term affects.  Will plan on EKG at physical in upcoming weeks + check labs.

## 2022-01-05 NOTE — Assessment & Plan Note (Signed)
Chronic, ongoing.  Followed by rheumatology at this time.  Recent notes and labs reviewed.  Continue current medication regimen as prescribed by them.

## 2022-01-05 NOTE — Assessment & Plan Note (Signed)
BMI 42.39.  Currently she is obtaining GLP1 treatment from online program.  Will consider taking this over in future as needed, possibly change to [facility] -- would need PA for this.  She has had success with GLP1.  Recommended eating smaller high protein, low fat meals more frequently and exercising 30 mins a day 5 times a week with a goal of 10-15lb weight loss in the next 3 months. Patient voiced their understanding and motivation to adhere to these recommendations.

## 2022-01-05 NOTE — Assessment & Plan Note (Signed)
Last menstrual cycle 2 years ago.  Current vaginal dryness, decreased libido, sleep disturbance, and stress incontinence.  Educated her on menopause and discussed hormone replacement with her.  At this time she would like to focus on herbal supplements, which is appropriate.  Recommend Replens OTC for vaginal dryness.  Obtain labs next visit.

## 2022-01-05 NOTE — Assessment & Plan Note (Signed)
Chronic, ongoing.  Main areas of discomfort are lower back, hands, and knees.  Followed by rheumatology.  Will continue this collaboration, recent notes and labs reviewed.  Continue current medication regimen as prescribed by them.

## 2022-01-05 NOTE — Assessment & Plan Note (Signed)
For her arthritic pain, as ordered by rheumatology -- continue to monitor this and monitor closely for GI bleed.

## 2022-01-05 NOTE — Patient Instructions (Signed)

## 2022-01-05 NOTE — Assessment & Plan Note (Signed)
In 2020 with long haul symptoms for one year -- monitor closely for recurrence.

## 2022-02-12 NOTE — Patient Instructions (Addendum)
Please call to schedule your mammogram and/or bone density: ?[facility]  ?Address: [address], [place], [place] [address] ?Phone: [phone]  ? ?Arthritis ?Arthritis is a term that is commonly used to refer to joint pain or joint disease. There are more than 100 types of arthritis. ?What are the causes? ?The most common cause of this condition is wear and tear of a joint. Other causes include: ?Gout. ?Inflammation of a joint. ?An infection of a joint. ?Sprains and other injuries near the joint. ?A reaction to medicines or drugs, or an allergic reaction. ?In some cases, the cause may not be known. ?What are the signs or symptoms? ?The main symptom of this condition is pain in the joint during movement. Other symptoms include: ?Redness, swelling, or stiffness at a joint. ?Warmth coming from the joint. ?Fever. ?Overall feeling of illness. ?How is this diagnosed? ?This condition may be diagnosed with a physical exam and tests, including: ?Blood tests. ?Urine tests. ?Imaging tests, such as X-rays, an MRI, or a CT scan. ?Sometimes, fluid is removed from a joint for testing. ?How is this treated? ?This condition may be treated with: ?Treatment of the cause, if it is known. ?Rest. ?Raising (elevating) the joint. ?Applying cold or hot packs to the joint. ?Medicines to improve symptoms and reduce inflammation. ?Injections of a steroid such as cortisone into the joint to help reduce pain and inflammation. ?Depending on the cause of your arthritis, you may need to make lifestyle changes to reduce stress on your joint. Changes may include: ?Exercising more. ?Losing weight. ?Follow these instructions at home: ?Medicines ?Take over-the-counter and prescription medicines only as told by your health care provider. ?Do not take aspirin to relieve pain if your health care provider thinks that gout may be causing your pain. ?Activity ?Rest your joint if told by your health care  provider. Rest is important when your disease is active and your joint feels painful, swollen, or stiff. ?Avoid activities that make the pain worse. It is important to balance activity with rest. ?Exercise your joint regularly with range-of-motion exercises as told by your health care provider. Try doing low-impact exercise, such as: ?Swimming. ?Water aerobics. ?Biking. ?Walking. ?Managing pain, stiffness, and swelling ?  ?If directed, put ice on the joint. ?Put ice in a plastic bag. ?Place a towel between your skin and the bag. ?Leave the ice on for 20 minutes, 2-3 times per day. ?If your joint is swollen, raise (elevate) it above the level of your heart if directed by your health care provider. ?If your joint feels stiff in the morning, try taking a warm shower. ?If directed, apply heat to the affected area as often as told by your health care provider. Use the heat source that your health care provider recommends, such as a moist heat pack or a heating pad. If you have diabetes, do not apply heat without permission from your health care provider. To apply heat: ?Place a towel between your skin and the heat source. ?Leave the heat on for 20-30 minutes. ?Remove the heat if your skin turns bright red. This is especially important if you are unable to feel pain, heat, or cold. You may have a greater risk of getting burned. ?General instructions ?Do not use any products that contain nicotine or tobacco, such as cigarettes, e-cigarettes, and chewing tobacco. If you need help quitting, ask your health care provider. ?Keep all follow-up visits as told by your health care provider. This is  important. ?Contact a health care provider if: ?The pain gets worse. ?You have a fever. ?Get help right away if: ?You develop severe joint pain, swelling, or redness. ?Many joints become painful and swollen. ?You develop severe back pain. ?You develop severe weakness in your leg. ?You cannot control your bladder or  bowels. ?Summary ?Arthritis is a term that is commonly used to refer to joint pain or joint disease. There are more than 100 types of arthritis. ?The most common cause of this condition is wear and tear of a joint. Other causes include gout, inflammation or infection of the joint, sprains, or allergies. ?Symptoms of this condition include redness, swelling, or stiffness of the joint. Other symptoms include warmth, fever, or feeling ill. ?This condition is treated with rest, elevation, medicines, and applying cold or hot packs. ?Follow your health care provider's instructions about medicines, activity, exercises, and other home care treatments. ?This information is not intended to replace advice given to you by your health care provider. Make sure you discuss any questions you have with your health care provider. ?Document Revised: 10/14/2018 Document Reviewed: 10/14/2018 ?Elsevier Patient Education ? 2022 [employer] ? ?

## 2022-02-16 ENCOUNTER — Ambulatory Visit (INDEPENDENT_AMBULATORY_CARE_PROVIDER_SITE_OTHER): Payer: BC Managed Care – PPO | Admitting: Nurse Practitioner

## 2022-02-16 ENCOUNTER — Other Ambulatory Visit (HOSPITAL_COMMUNITY)
Admission: RE | Admit: 2022-02-16 | Discharge: 2022-02-16 | Disposition: A | Discharge status: Home / Self Care | Payer: BC Managed Care – PPO | Source: Ambulatory Visit | Attending: Nurse Practitioner | Admitting: Nurse Practitioner

## 2022-02-16 ENCOUNTER — Encounter: Payer: Self-pay | Admitting: Nurse Practitioner

## 2022-02-16 VITALS — BP 117/72 | HR 71 | Temp 98.1°F | Ht 69.0 in | Wt 284.0 lb

## 2022-02-16 DIAGNOSIS — E538 Deficiency of other specified B group vitamins: Secondary | ICD-10-CM

## 2022-02-16 DIAGNOSIS — N951 Menopausal and female climacteric states: Secondary | ICD-10-CM | POA: Diagnosis not present

## 2022-02-16 DIAGNOSIS — M797 Fibromyalgia: Secondary | ICD-10-CM | POA: Diagnosis not present

## 2022-02-16 DIAGNOSIS — E559 Vitamin D deficiency, unspecified: Secondary | ICD-10-CM

## 2022-02-16 DIAGNOSIS — Z1159 Encounter for screening for other viral diseases: Secondary | ICD-10-CM | POA: Diagnosis not present

## 2022-02-16 DIAGNOSIS — Z1211 Encounter for screening for malignant neoplasm of colon: Secondary | ICD-10-CM

## 2022-02-16 DIAGNOSIS — N814 Uterovaginal prolapse, unspecified: Secondary | ICD-10-CM | POA: Insufficient documentation

## 2022-02-16 DIAGNOSIS — Z114 Encounter for screening for human immunodeficiency virus [HIV]: Secondary | ICD-10-CM | POA: Diagnosis not present

## 2022-02-16 DIAGNOSIS — M0239 Reiter's disease, multiple sites: Secondary | ICD-10-CM | POA: Diagnosis not present

## 2022-02-16 DIAGNOSIS — Z13220 Encounter for screening for lipoid disorders: Secondary | ICD-10-CM | POA: Diagnosis not present

## 2022-02-16 DIAGNOSIS — Z1231 Encounter for screening mammogram for malignant neoplasm of breast: Secondary | ICD-10-CM

## 2022-02-16 DIAGNOSIS — R7301 Impaired fasting glucose: Secondary | ICD-10-CM | POA: Diagnosis not present

## 2022-02-16 DIAGNOSIS — Z124 Encounter for screening for malignant neoplasm of cervix: Secondary | ICD-10-CM

## 2022-02-16 DIAGNOSIS — Z136 Encounter for screening for cardiovascular disorders: Secondary | ICD-10-CM | POA: Diagnosis not present

## 2022-02-16 DIAGNOSIS — Z0000 Encounter for general adult medical examination without abnormal findings: Secondary | ICD-10-CM

## 2022-02-16 LAB — BAYER DCA HB A1C WAIVED: HB A1C (BAYER DCA - WAIVED): 5.2 % (ref 4.8–5.6)

## 2022-02-16 NOTE — Assessment & Plan Note (Signed)
Chronic, ongoing.  Followed by rheumatology at this time.  Recent notes and labs reviewed.  Continue current medication regimen as prescribed by them.  Labs today. ?

## 2022-02-16 NOTE — Progress Notes (Signed)
? ?BP 117/72   Pulse 71   Temp 98.1 ?F (36.7 ?C) (Oral)   Ht 5\' 9"  (1.753 m)   Wt 284 lb (128.8 kg)   LMP 08/25/2015   SpO2 96%   BMI 41.94 kg/m?   ? ?Subjective:  ? ? Patient ID: [name], female    DOB: 1965/11/22, 56 y.o.   MRN: [id number] ? ?HPI: ?Julie BARB is a 56 y.o. female presenting on 02/16/2022 for comprehensive medical examination. Current medical complaints include:none ? ?She currently lives with: ?Menopausal Symptoms: yes -- loss of libido, is concerned about prolapse -- needing to urinate more -- last menstrual cycle was prior to Covid -- then had cycle with her first Covid shot. ? ?Currently continues with online program for weight loss -- taking Trulicity and Metformin.  Is working on weight loss and doing coaching with them. ? ?Depression Screen done today and results listed below:  ? ?  02/16/2022  ?  9:15 AM 01/05/2022  ? 11:42 AM 08/28/2019  ?  4:40 PM  ?Depression screen PHQ 2/9  ?Decreased Interest 0 0 0  ?Down, Depressed, Hopeless 0 0 0  ?PHQ - 2 Score 0 0 0  ?Altered sleeping 0 1   ?Tired, decreased energy 0 0   ?Change in appetite 0 0   ?Feeling bad or failure about yourself  0 0   ?Trouble concentrating 0 0   ?Moving slowly or fidgety/restless 0 0   ?Suicidal thoughts 0 0   ?PHQ-9 Score 0 1   ?Difficult doing work/chores  Not difficult at all   ? ? ?The patient does not have a history of falls. I did not complete a risk assessment for falls. A plan of care for falls was not documented. ? ? ?Past Medical History:  ?Past Medical History:  ?Diagnosis Date  ? Concussion 2008  ? Fibromyalgia   ? Hx of migraines   ? 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HCC)   ? Viral meningitis   ? ? ?Surgical History:  ?Past Surgical History:  ?Procedure Laterality Date  ? LAPAROSCOPIC APPENDECTOMY N/A 10/02/2019  ? Procedure: APPENDECTOMY LAPAROSCOPIC;  Surgeon: 13/10/2019, MD;  Location: [facility] ORS;  Service: General;  Laterality: N/A;  ? ? ?Medications:  ?Current Outpatient Medications on File Prior to  Visit  ?Medication Sig  ? indomethacin (INDOCIN) 50 MG capsule indomethacin 50 mg capsule  ? magnesium oxide (MAG-OX) 400 MG tablet Take 400 mg by mouth daily.  ? metFORMIN (GLUCOPHAGE) 500 MG tablet Take 500 mg by mouth daily.  ? TRULICITY 1.5 MG/0.5ML SOPN SMARTSIG:0.5 Milliliter(s) SUB-Q Once a Week  ? ?No current facility-administered medications on file prior to visit.  ? ? ?Allergies:  ?Allergies  ?Allergen Reactions  ? Influenza A (H1n1) Monovalent Vaccine   ?  Fever and aches and flu symptoms and n/v  ? Vancomycin Rash  ?  Red man reaction  ? ? ?Social History:  ?Social History  ? ?Socioeconomic History  ? Marital status: Married  ?  Spouse name: Not on file  ? Number of children: Not on file  ? Years of education: Not on file  ? Highest education level: Not on file  ?Occupational History  ? Occupation: [employer] in [employer]  ?Tobacco Use  ? Smoking status: Former  ?  Packs/day: 0.25  ?  Years: 3.00  ?  Pack years: 0.75  ?  Types: Cigarettes  ?  Quit date: 08/25/2013  ?  Years since quitting: 8.4  ? Smokeless  tobacco: Never  ? Tobacco comments:  ?  no smoking in 5 days  ?Substance and Sexual Activity  ? Alcohol use: Yes  ?  Alcohol/week: 1.0 standard drink  ?  Types: 1 Cans of beer per week  ?  Comment: occ  ? Drug use: No  ? Sexual activity: Yes  ?Other Topics Concern  ? Not on file  ?Social History Narrative  ? Not on file  ? ?Social Determinants of Health  ? ?Financial Resource Strain: Low Risk   ? Difficulty of Paying Living Expenses: Not hard at all  ?Food Insecurity: No Food Insecurity  ? Worried About Programme researcher, broadcasting/film/videounning Out of Food in the Last Year: Never true  ? Ran Out of Food in the Last Year: Never true  ?Transportation Needs: No Transportation Needs  ? Lack of Transportation (Medical): No  ? Lack of Transportation (Non-Medical): No  ?Physical Activity: Sufficiently Active  ? Days of Exercise per Week: 5 days  ? Minutes of Exercise per Session: 30 min  ?Stress: No Stress Concern Present  ?  Feeling of Stress : Not at all  ?Social Connections: Moderately Isolated  ? Frequency of Communication with Friends and Family: More than three times a week  ? Frequency of Social Gatherings with Friends and Family: More than three times a week  ? Attends Religious Services: Never  ? Active Member of Clubs or Organizations: No  ? Attends BankerClub or Organization Meetings: Never  ? Marital Status: Married  ?Intimate Partner Violence: Not At Risk  ? Fear of Current or Ex-Partner: No  ? Emotionally Abused: No  ? Physically Abused: No  ? Sexually Abused: No  ? ?Social History  ? ?Tobacco Use  ?Smoking Status Former  ? Packs/day: 0.25  ? Years: 3.00  ? Pack years: 0.75  ? Types: Cigarettes  ? Quit date: 08/25/2013  ? Years since quitting: 8.4  ?Smokeless Tobacco Never  ?Tobacco Comments  ? no smoking in 5 days  ? ?Social History  ? ?Substance and Sexual Activity  ?Alcohol Use Yes  ? Alcohol/week: 1.0 standard drink  ? Types: 1 Cans of beer per week  ? Comment: occ  ? ? ?Family History:  ?Family History  ?Problem Relation Age of Onset  ? Hyperlipidemia Mother   ? Heart Problems Father   ? Healthy Sister   ? Autism spectrum disorder Son   ? Autism spectrum disorder Son   ? Autism spectrum disorder Son   ? Breast cancer Neg Hx   ? ? ?Past medical history, surgical history, medications, allergies, family history and social history reviewed with patient today and changes made to appropriate areas of the chart.  ? ?ROS ?All other ROS negative except what is listed above and in the HPI.  ? ?   ?Objective:  ?  ?BP 117/72   Pulse 71   Temp 98.1 ?F (36.7 ?C) (Oral)   Ht 5\' 9"  (1.753 m)   Wt 284 lb (128.8 kg)   LMP 08/25/2015   SpO2 96%   BMI 41.94 kg/m?   ?Wt Readings from Last 3 Encounters:  ?02/16/22 284 lb (128.8 kg)  ?01/05/22 278 lb 12.8 oz (126.5 kg)  ?12/25/21 265 lb (120.2 kg)  ?  ?Physical Exam ?Vitals and nursing note reviewed. Exam conducted with a chaperone present.  ?Constitutional:   ?   General: She is awake. She  is not in acute distress. ?   Appearance: She is well-developed and well-groomed. She is obese. She is not  ill-appearing or toxic-appearing.  ?HENT:  ?   Head: Normocephalic and atraumatic.  ?   Right Ear: Hearing, tympanic membrane, ear canal and external ear normal. No drainage.  ?   Left Ear: Hearing, tympanic membrane, ear canal and external ear normal. No drainage.  ?   Nose: Nose normal.  ?   Right Sinus: No maxillary sinus tenderness or frontal sinus tenderness.  ?   Left Sinus: No maxillary sinus tenderness or frontal sinus tenderness.  ?   Mouth/Throat:  ?   Mouth: Mucous membranes are moist.  ?   Pharynx: Oropharynx is clear. Uvula midline. No pharyngeal swelling, oropharyngeal exudate or posterior oropharyngeal erythema.  ?Eyes:  ?   General: Lids are normal.     ?   Right eye: No discharge.     ?   Left eye: No discharge.  ?   Extraocular Movements: Extraocular movements intact.  ?   Conjunctiva/sclera: Conjunctivae normal.  ?   Pupils: Pupils are equal, round, and reactive to light.  ?   Visual Fields: Right eye visual fields normal and left eye visual fields normal.  ?Neck:  ?   Thyroid: No thyromegaly.  ?   Vascular: No carotid bruit.  ?   Trachea: Trachea normal.  ?Cardiovascular:  ?   Rate and Rhythm: Normal rate and regular rhythm.  ?   Heart sounds: Normal heart sounds. No murmur heard. ?  No gallop.  ?Pulmonary:  ?   Effort: Pulmonary effort is normal. No accessory muscle usage or respiratory distress.  ?   Breath sounds: Normal breath sounds.  ?Chest:  ?Breasts: ?   Right: Normal.  ?   Left: Normal.  ?Abdominal:  ?   General: Bowel sounds are normal.  ?   Palpations: Abdomen is soft. There is no hepatomegaly or splenomegaly.  ?   Tenderness: There is no abdominal tenderness.  ?   Hernia: There is no hernia in the left inguinal area or right inguinal area.  ?Genitourinary: ?   Exam position: Lithotomy position.  ?   Labia:     ?   Right: No rash.   ?   Urethra: No prolapse.  ?   Vagina: Erythema  (mild atrophy) present. No tenderness.  ?   Cervix: Normal.  ?   Uterus: With uterine prolapse (mild noted).   ?   Adnexa: Right adnexa normal and left adnexa normal.  ?   Comments: Cervix anterior and viewed with slight pr

## 2022-02-16 NOTE — Assessment & Plan Note (Signed)
Last menstrual cycle 2 years ago.  Current vaginal dryness, decreased libido, sleep disturbance, and stress incontinence.  Educated her on menopause and discussed hormone replacement with her.  At this time she would like to focus on herbal supplements, which is appropriate.  Recommend Replens OTC for vaginal dryness.  Obtain labs today. ?

## 2022-02-16 NOTE — Assessment & Plan Note (Addendum)
Mild noted on pap exam, more prominent downward cervix.  Referral to GYN as does have symptoms of increased urinary frequency and pressure. ?

## 2022-02-16 NOTE — Assessment & Plan Note (Signed)
Chronic, ongoing.  Main areas of discomfort are lower back, hands, and knees.  Followed by rheumatology.  Will continue this collaboration, recent notes and labs reviewed.  Continue current medication regimen as prescribed by them.  Labs today. ?

## 2022-02-16 NOTE — Assessment & Plan Note (Signed)
BMI 41.94.  Currently she is obtaining GLP1 treatment from online program.  Will consider taking this over in future as needed, possibly change to [facility] -- would need PA for this.  She has had success with GLP1.  Recommended eating smaller high protein, low fat meals more frequently and exercising 30 mins a day 5 times a week with a goal of 10-15lb weight loss in the next 3 months. Patient voiced their understanding and motivation to adhere to these recommendations. ? ?

## 2022-02-17 ENCOUNTER — Encounter: Payer: Self-pay | Admitting: Nurse Practitioner

## 2022-02-17 DIAGNOSIS — E7800 Pure hypercholesterolemia, unspecified: Secondary | ICD-10-CM | POA: Insufficient documentation

## 2022-02-17 DIAGNOSIS — E559 Vitamin D deficiency, unspecified: Secondary | ICD-10-CM | POA: Insufficient documentation

## 2022-02-17 LAB — COMPREHENSIVE METABOLIC PANEL
ALT: 22 IU/L (ref 0–32)
AST: 22 IU/L (ref 0–40)
Albumin/Globulin Ratio: 1.9 (ref 1.2–2.2)
Albumin: 4.5 g/dL (ref 3.8–4.9)
Alkaline Phosphatase: 79 IU/L (ref 44–121)
BUN/Creatinine Ratio: 24 — ABNORMAL HIGH (ref 9–23)
BUN: 19 mg/dL (ref 6–24)
Bilirubin Total: 0.3 mg/dL (ref 0.0–1.2)
CO2: 19 mmol/L — ABNORMAL LOW (ref 20–29)
Calcium: 9.8 mg/dL (ref 8.7–10.2)
Chloride: 102 mmol/L (ref 96–106)
Creatinine, Ser: 0.79 mg/dL (ref 0.57–1.00)
Globulin, Total: 2.4 g/dL (ref 1.5–4.5)
Glucose: 81 mg/dL (ref 70–99)
Potassium: 4.5 mmol/L (ref 3.5–5.2)
Sodium: 142 mmol/L (ref 134–144)
Total Protein: 6.9 g/dL (ref 6.0–8.5)
eGFR: 89 mL/min/{1.73_m2} (ref >59)

## 2022-02-17 LAB — CBC WITH DIFFERENTIAL/PLATELET
Basophils Absolute: 0.1 10*3/uL (ref 0.0–0.2)
Basos: 1 %
EOS (ABSOLUTE): 0.8 10*3/uL — ABNORMAL HIGH (ref 0.0–0.4)
Eos: 12 %
Hematocrit: 41.8 % (ref 34.0–46.6)
Hemoglobin: 13.3 g/dL (ref 11.1–15.9)
Immature Grans (Abs): 0 10*3/uL (ref 0.0–0.1)
Immature Granulocytes: 0 %
Lymphocytes Absolute: 2.6 10*3/uL (ref 0.7–3.1)
Lymphs: 40 %
MCH: 27 pg (ref 26.6–33.0)
MCHC: 31.8 g/dL (ref 31.5–35.7)
MCV: 85 fL (ref 79–97)
Monocytes Absolute: 0.4 10*3/uL (ref 0.1–0.9)
Monocytes: 6 %
Neutrophils Absolute: 2.6 10*3/uL (ref 1.4–7.0)
Neutrophils: 41 %
Platelets: 287 10*3/uL (ref 150–450)
RBC: 4.93 x10E6/uL (ref 3.77–5.28)
RDW: 14.6 % (ref 11.7–15.4)
WBC: 6.4 10*3/uL (ref 3.4–10.8)

## 2022-02-17 LAB — HIV ANTIBODY (ROUTINE TESTING W REFLEX): HIV Screen 4th Generation wRfx: NONREACTIVE

## 2022-02-17 LAB — VITAMIN D 25 HYDROXY (VIT D DEFICIENCY, FRACTURES): Vit D, 25-Hydroxy: 29.2 ng/mL — ABNORMAL LOW (ref 30.0–100.0)

## 2022-02-17 LAB — LIPID PANEL W/O CHOL/HDL RATIO
Cholesterol, Total: 237 mg/dL — ABNORMAL HIGH (ref 100–199)
HDL: 55 mg/dL (ref >39)
LDL Chol Calc (NIH): 152 mg/dL — ABNORMAL HIGH (ref 0–99)
Triglycerides: 165 mg/dL — ABNORMAL HIGH (ref 0–149)
VLDL Cholesterol Cal: 30 mg/dL (ref 5–40)

## 2022-02-17 LAB — TSH: TSH: 2.32 u[IU]/mL (ref 0.450–4.500)

## 2022-02-17 LAB — FSH/LH
FSH: 46.6 m[IU]/mL
LH: 34.2 m[IU]/mL

## 2022-02-17 LAB — VITAMIN B12: Vitamin B-12: 1246 pg/mL — ABNORMAL HIGH (ref 232–1245)

## 2022-02-17 LAB — HEPATITIS C ANTIBODY: Hep C Virus Ab: NONREACTIVE

## 2022-02-17 NOTE — Progress Notes (Signed)
Contacted via [place] ?The 10-year ASCVD risk score (Arnett DK, et al., 2019) is: 1.8% ?  Values used to calculate the score: ?    Age: 56 years ?    Sex: Female ?    Is Non-Hispanic African American: No ?    Diabetic: No ?    Tobacco smoker: No ?    Systolic Blood Pressure: 935 mmHg ?    Is BP treated: No ?    HDL Cholesterol: 55 mg/dL ?    Total Cholesterol: 237 mg/dL ? ? ?Good evening [name], your labs have returned: ?- CBC shows no anemia or infection. ?- Hormone labs do show you are through the menopause journey, although remember some of the effects can stay a little longer. ?- Kidney function, creatinine and eGFR, remains normal, as is liver function, AST and ALT.   ?- B12 level is good, but Vitamin D remains a little low.  Please ensure you take Vitamin D3 2000 units daily for bone health. ?- Thyroid lab is normal, TSH. ?- Hep C and HIV are negative:) ?- Your cholesterol is high, but recommendations to make lifestyle changes. Your LDL is above normal. ?The LDL is the bad cholesterol. ?Over time and in combination with inflammation and other factors, this contributes to plaque which in turn may lead to stroke and/or heart attack down the road. ?Sometimes high LDL is primarily genetic, and people might be eating all the right foods but still have high numbers. ?Other times, there is room for improvement in one's diet and eating healthier can bring this number down and potentially reduce one's risk of heart attack and/or stroke.  ?? ?To reduce your LDL, Remember - more fruits and vegetables, more fish, and limit red meat and dairy products. ?More soy, nuts, beans, barley, lentils, oats and plant sterol ester enriched margarine instead of butter. ?I also encourage eliminating sugar and processed food. ?Remember, shop on the outside of the grocery store and visit your [employer]. ??If you would like to talk with me about dietary changes for your cholesterol, please let me know. We should recheck your  cholesterol in 6-12 months.  Any questions? Have a great weekend!! ?Keep being awesome!!  Thank you for allowing me to participate in your care.  I appreciate you. ?Kindest regards, ?Lashon Beringer ? ?? ?

## 2022-02-21 ENCOUNTER — Other Ambulatory Visit: Payer: Self-pay | Admitting: Nurse Practitioner

## 2022-02-21 DIAGNOSIS — R87610 Atypical squamous cells of undetermined significance on cytologic smear of cervix (ASC-US): Secondary | ICD-10-CM

## 2022-02-21 LAB — CYTOLOGY - PAP
Comment: NEGATIVE
Comment: NEGATIVE
Comment: NEGATIVE
Diagnosis: UNDETERMINED — AB
HPV 16: NEGATIVE
HPV 18 / 45: NEGATIVE
High risk HPV: POSITIVE — AB

## 2022-02-21 NOTE — Progress Notes (Signed)
Contacted via MyChart ? ? ?Good evening [name], I have your pap results.  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  For this further evaluation will need to be done.  This does not mean you have cervical cancer, but it does mean we need to assess this more.  I will place referral to gynecology and you should hear from them over the next week.  If you do not let me know.  If you need some positive thoughts and big hugs I am here for you.  Any questions? ?Keep being amazing!!  Thank you for allowing me to participate in your care.  I appreciate you. ?Kindest regards, ?Haydan Wedig ?

## 2022-03-14 DIAGNOSIS — R87810 Cervical high risk human papillomavirus (HPV) DNA test positive: Secondary | ICD-10-CM | POA: Diagnosis not present

## 2022-03-14 DIAGNOSIS — R87610 Atypical squamous cells of undetermined significance on cytologic smear of cervix (ASC-US): Secondary | ICD-10-CM | POA: Diagnosis not present

## 2022-03-14 DIAGNOSIS — N950 Postmenopausal bleeding: Secondary | ICD-10-CM | POA: Diagnosis not present

## 2022-03-14 DIAGNOSIS — N816 Rectocele: Secondary | ICD-10-CM | POA: Diagnosis not present

## 2022-03-17 ENCOUNTER — Encounter: Payer: Self-pay | Admitting: Nurse Practitioner

## 2022-04-03 DIAGNOSIS — R87610 Atypical squamous cells of undetermined significance on cytologic smear of cervix (ASC-US): Secondary | ICD-10-CM | POA: Diagnosis not present

## 2022-04-03 DIAGNOSIS — R87810 Cervical high risk human papillomavirus (HPV) DNA test positive: Secondary | ICD-10-CM | POA: Diagnosis not present

## 2022-04-03 DIAGNOSIS — R9389 Abnormal findings on diagnostic imaging of other specified body structures: Secondary | ICD-10-CM | POA: Diagnosis not present

## 2022-04-03 DIAGNOSIS — N950 Postmenopausal bleeding: Secondary | ICD-10-CM | POA: Diagnosis not present

## 2022-04-04 ENCOUNTER — Other Ambulatory Visit: Payer: Self-pay | Admitting: Obstetrics and Gynecology

## 2022-04-07 ENCOUNTER — Other Ambulatory Visit: Payer: Self-pay

## 2022-04-07 ENCOUNTER — Other Ambulatory Visit
Admission: RE | Admit: 2022-04-07 | Discharge: 2022-04-07 | Disposition: A | Discharge status: Home / Self Care | Payer: BC Managed Care – PPO | Source: Ambulatory Visit | Attending: Obstetrics and Gynecology | Admitting: Obstetrics and Gynecology

## 2022-04-07 ENCOUNTER — Encounter
Admission: RE | Admit: 2022-04-07 | Discharge: 2022-04-07 | Disposition: A | Discharge status: Home / Self Care | Payer: BC Managed Care – PPO | Source: Ambulatory Visit | Attending: Obstetrics and Gynecology | Admitting: Obstetrics and Gynecology

## 2022-04-07 DIAGNOSIS — Z01812 Encounter for preprocedural laboratory examination: Secondary | ICD-10-CM | POA: Insufficient documentation

## 2022-04-07 DIAGNOSIS — N950 Postmenopausal bleeding: Secondary | ICD-10-CM | POA: Diagnosis not present

## 2022-04-07 HISTORY — DX: Other complications of anesthesia, initial encounter: T88.59XA

## 2022-04-07 HISTORY — DX: Headache, unspecified: R51.9

## 2022-04-07 LAB — TYPE AND SCREEN
ABO/RH(D): O POS
Antibody Screen: NEGATIVE

## 2022-04-07 NOTE — Patient Instructions (Addendum)
Your procedure is scheduled on: Wednesday 04/12/22 Report to the Registration Desk on the 1st floor of the [facility]. To find out your arrival time, please call [phone] between 1PM - 3PM on: Tuesday 04/11/22 If your arrival time is 6:00 am, do not arrive prior to that time as the [facility] entrance doors do not open until 6:00 am.  REMEMBER: Instructions that are not followed completely may result in serious medical risk, up to and including death; or upon the discretion of your surgeon and anesthesiologist your surgery may need to be rescheduled.  Do not eat food after midnight the night before surgery.  No gum chewing, lozengers or hard candies.  You may however, drink CLEAR liquids up to 2 hours before you are scheduled to arrive for your surgery. Do not drink anything within 2 hours of your scheduled arrival time.  Clear liquids include: - water  - apple juice without pulp - gatorade (not RED colors) - black coffee or tea (Do NOT add milk or creamers to the coffee or tea) Do NOT drink anything that is not on this list.  In addition, your doctor has ordered for you to drink the provided  Ensure Pre-Surgery Clear Carbohydrate Drink  Drinking this carbohydrate drink up to two hours before surgery helps to reduce insulin resistance and improve patient outcomes. Please complete drinking 2 hours prior to scheduled arrival time.  TAKE THESE MEDICATIONS THE MORNING OF SURGERY WITH A SIP OF WATER: NONE  One week prior to surgery: Stop Anti-inflammatories (NSAIDS) such as indomethacin (INDOCIN) 50 MG capsule, Advil, Aleve, Ibuprofen, Motrin, Naproxen, Naprosyn and Aspirin based products such as Excedrin, Goodys Powder, BC Powder.  Stop taking your magnesium oxide (MAG-OX) 400 MG tablet and ANY OVER THE COUNTER supplements until after surgery.  You may however, continue to take Tylenol if needed for pain up until the day of surgery.  No Alcohol for 24 hours before or after  surgery.  No Smoking including e-cigarettes for 24 hours prior to surgery.  No chewable tobacco products for at least 6 hours prior to surgery.  No nicotine patches on the day of surgery.  Do not use any "recreational" drugs for at least a week prior to your surgery.  Please be advised that the combination of cocaine and anesthesia may have negative outcomes, up to and including death. If you test positive for cocaine, your surgery will be cancelled.  On the morning of surgery brush your teeth with toothpaste and water, you may rinse your mouth with mouthwash if you wish. Do not swallow any toothpaste or mouthwash.  Do not wear jewelry, make-up, hairpins, clips or nail polish.  Do not wear lotions, powders, or perfumes.   Do not shave body from the neck down 48 hours prior to surgery just in case you cut yourself which could leave a site for infection.   Do not bring valuables to the hospital. [facility] is not responsible for any missing/lost belongings or valuables.   Notify your doctor if there is any change in your medical condition (cold, fever, infection).  Wear comfortable clothing (specific to your surgery type) to the hospital.  After surgery, you can help prevent lung complications by doing breathing exercises.  Take deep breaths and cough every 1-2 hours.   If you are being discharged the day of surgery, you will not be allowed to drive home. You will need a responsible adult (18 years or older) to drive you home and stay with  you that night.   If you are taking public transportation, you will need to have a responsible adult (18 years or older) with you. Please confirm with your physician that it is acceptable to use public transportation.   Please call the [facility]. at [phone] if you have any questions about these instructions.  Surgery Visitation Policy:  Patients undergoing a surgery or procedure may have two family members or support  persons with them as long as the person is not COVID-19 positive or experiencing its symptoms.   Inpatient Visitation:    Visiting hours are 7 a.m. to 8 p.m. Up to four visitors are allowed at one time in a patient room, including children. The visitors may rotate out with other people during the day. One designated support person (adult) may remain overnight.

## 2022-04-11 MED ORDER — ORAL CARE MOUTH RINSE: 15.0000 mL | Freq: Once | OROMUCOSAL | Status: AC

## 2022-04-11 MED ORDER — LACTATED RINGERS IV SOLN: INTRAVENOUS | Status: DC

## 2022-04-11 MED ORDER — CHLORHEXIDINE GLUCONATE 0.12 % MT SOLN: 15.0000 mL | Freq: Once | OROMUCOSAL | Status: AC

## 2022-04-12 ENCOUNTER — Ambulatory Visit
Admission: RE | Admit: 2022-04-12 | Discharge: 2022-04-12 | Disposition: A | Discharge status: Home / Self Care | Payer: BC Managed Care – PPO | Attending: Obstetrics and Gynecology | Admitting: Obstetrics and Gynecology

## 2022-04-12 ENCOUNTER — Ambulatory Visit: Payer: BC Managed Care – PPO | Admitting: Certified Registered"

## 2022-04-12 ENCOUNTER — Other Ambulatory Visit: Payer: Self-pay

## 2022-04-12 ENCOUNTER — Encounter
Admission: RE | Disposition: A | Discharge status: Home / Self Care | Payer: Self-pay | Source: Home / Self Care | Attending: Obstetrics and Gynecology

## 2022-04-12 ENCOUNTER — Encounter: Payer: Self-pay | Admitting: Obstetrics and Gynecology

## 2022-04-12 DIAGNOSIS — N8500 Endometrial hyperplasia, unspecified: Secondary | ICD-10-CM | POA: Diagnosis not present

## 2022-04-12 DIAGNOSIS — R9389 Abnormal findings on diagnostic imaging of other specified body structures: Secondary | ICD-10-CM | POA: Diagnosis not present

## 2022-04-12 DIAGNOSIS — R87810 Cervical high risk human papillomavirus (HPV) DNA test positive: Secondary | ICD-10-CM | POA: Diagnosis not present

## 2022-04-12 DIAGNOSIS — R6881 Early satiety: Secondary | ICD-10-CM | POA: Insufficient documentation

## 2022-04-12 DIAGNOSIS — R87610 Atypical squamous cells of undetermined significance on cytologic smear of cervix (ASC-US): Secondary | ICD-10-CM | POA: Diagnosis present

## 2022-04-12 DIAGNOSIS — M797 Fibromyalgia: Secondary | ICD-10-CM | POA: Insufficient documentation

## 2022-04-12 DIAGNOSIS — N840 Polyp of corpus uteri: Secondary | ICD-10-CM | POA: Clinically undetermined

## 2022-04-12 DIAGNOSIS — Z8661 Personal history of infections of the central nervous system: Secondary | ICD-10-CM | POA: Insufficient documentation

## 2022-04-12 DIAGNOSIS — N950 Postmenopausal bleeding: Secondary | ICD-10-CM | POA: Diagnosis not present

## 2022-04-12 DIAGNOSIS — Z87891 Personal history of nicotine dependence: Secondary | ICD-10-CM | POA: Insufficient documentation

## 2022-04-12 DIAGNOSIS — Z8616 Personal history of COVID-19: Secondary | ICD-10-CM | POA: Insufficient documentation

## 2022-04-12 DIAGNOSIS — Z6841 Body Mass Index (BMI) 40.0 and over, adult: Secondary | ICD-10-CM | POA: Diagnosis not present

## 2022-04-12 HISTORY — PX: COLPOSCOPY: SHX161

## 2022-04-12 HISTORY — PX: DILATATION & CURETTAGE/HYSTEROSCOPY WITH MYOSURE: SHX6511

## 2022-04-12 LAB — ABO/RH: ABO/RH(D): O POS

## 2022-04-12 SURGERY — COLPOSCOPY
Anesthesia: General

## 2022-04-12 MED ORDER — 0.9 % SODIUM CHLORIDE (POUR BTL) OPTIME
TOPICAL | Status: DC | PRN
  Administered 2022-04-12: 150 mL

## 2022-04-12 MED ORDER — FENTANYL CITRATE (PF) 100 MCG/2ML IJ SOLN: 25.0000 ug | INTRAMUSCULAR | Status: DC | PRN

## 2022-04-12 MED ORDER — CHLORHEXIDINE GLUCONATE 0.12 % MT SOLN
OROMUCOSAL | Status: AC
  Administered 2022-04-12: 15 mL via OROMUCOSAL
  Filled 2022-04-12: qty 15

## 2022-04-12 MED ORDER — FENTANYL CITRATE (PF) 100 MCG/2ML IJ SOLN
INTRAMUSCULAR | Status: AC
  Filled 2022-04-12: qty 2

## 2022-04-12 MED ORDER — MIDAZOLAM HCL 2 MG/2ML IJ SOLN
INTRAMUSCULAR | Status: DC | PRN
  Administered 2022-04-12: 2 mg via INTRAVENOUS

## 2022-04-12 MED ORDER — PHENYLEPHRINE HCL (PRESSORS) 10 MG/ML IV SOLN
INTRAVENOUS | Status: DC | PRN
  Administered 2022-04-12 (×2): 50 ug via INTRAVENOUS

## 2022-04-12 MED ORDER — ACETIC ACID 3 % SOLN
Status: DC | PRN
  Administered 2022-04-12: 3 via TOPICAL

## 2022-04-12 MED ORDER — DEXAMETHASONE SODIUM PHOSPHATE 10 MG/ML IJ SOLN
INTRAMUSCULAR | Status: DC | PRN
  Administered 2022-04-12: 8 mg via INTRAVENOUS

## 2022-04-12 MED ORDER — ONDANSETRON HCL 4 MG/2ML IJ SOLN
INTRAMUSCULAR | Status: DC | PRN
  Administered 2022-04-12: 4 mg via INTRAVENOUS

## 2022-04-12 MED ORDER — EPHEDRINE SULFATE (PRESSORS) 50 MG/ML IJ SOLN
INTRAMUSCULAR | Status: DC | PRN
  Administered 2022-04-12: 5 mg via INTRAVENOUS

## 2022-04-12 MED ORDER — MIDAZOLAM HCL 2 MG/2ML IJ SOLN
INTRAMUSCULAR | Status: AC
  Filled 2022-04-12: qty 2

## 2022-04-12 MED ORDER — SODIUM CHLORIDE 0.9 % IR SOLN
Status: DC | PRN
  Administered 2022-04-12: 1020 mL

## 2022-04-12 MED ORDER — PROPOFOL 10 MG/ML IV BOLUS
INTRAVENOUS | Status: DC | PRN
  Administered 2022-04-12: 200 mg via INTRAVENOUS

## 2022-04-12 MED ORDER — FERRIC SUBSULFATE 259 MG/GM EX SOLN
CUTANEOUS | Status: DC | PRN
  Administered 2022-04-12: 3 via TOPICAL

## 2022-04-12 MED ORDER — FERRIC SUBSULFATE 259 MG/GM EX SOLN
CUTANEOUS | Status: AC
  Filled 2022-04-12: qty 8

## 2022-04-12 MED ORDER — IBUPROFEN 600 MG PO TABS: 600.0000 mg | ORAL_TABLET | Freq: Four times a day (QID) | ORAL | 0 refills | Status: DC

## 2022-04-12 MED ORDER — GLYCOPYRROLATE 0.2 MG/ML IJ SOLN
INTRAMUSCULAR | Status: DC | PRN
  Administered 2022-04-12: .2 mg via INTRAVENOUS

## 2022-04-12 MED ORDER — DEXMEDETOMIDINE (PRECEDEX) IN NS 20 MCG/5ML (4 MCG/ML) IV SYRINGE
PREFILLED_SYRINGE | INTRAVENOUS | Status: DC | PRN
Start: 2022-04-12 — End: 2022-04-12
  Administered 2022-04-12: 12 ug via INTRAVENOUS
  Administered 2022-04-12: 8 ug via INTRAVENOUS

## 2022-04-12 MED ORDER — IODINE STRONG (LUGOLS) 5 % PO SOLN
ORAL | Status: AC
  Filled 2022-04-12: qty 1

## 2022-04-12 MED ORDER — ACETIC ACID 3 % SOLN
Status: AC
  Filled 2022-04-12: qty 500

## 2022-04-12 MED ORDER — OXYCODONE HCL 5 MG PO TABS: 5.0000 mg | ORAL_TABLET | Freq: Once | ORAL | Status: DC | PRN

## 2022-04-12 MED ORDER — SUCCINYLCHOLINE CHLORIDE 200 MG/10ML IV SOSY
PREFILLED_SYRINGE | INTRAVENOUS | Status: DC | PRN
  Administered 2022-04-12: 120 mg via INTRAVENOUS

## 2022-04-12 MED ORDER — KETOROLAC TROMETHAMINE 30 MG/ML IJ SOLN
INTRAMUSCULAR | Status: DC | PRN
  Administered 2022-04-12: 30 mg via INTRAVENOUS

## 2022-04-12 MED ORDER — OXYCODONE HCL 5 MG/5ML PO SOLN: 5.0000 mg | Freq: Once | ORAL | Status: DC | PRN

## 2022-04-12 MED ORDER — LIDOCAINE HCL (CARDIAC) PF 100 MG/5ML IV SOSY
PREFILLED_SYRINGE | INTRAVENOUS | Status: DC | PRN
  Administered 2022-04-12: 100 mg via INTRAVENOUS

## 2022-04-12 MED ORDER — FENTANYL CITRATE (PF) 100 MCG/2ML IJ SOLN
INTRAMUSCULAR | Status: DC | PRN
  Administered 2022-04-12: 25 ug via INTRAVENOUS
  Administered 2022-04-12: 50 ug via INTRAVENOUS
  Administered 2022-04-12: 25 ug via INTRAVENOUS

## 2022-04-12 SURGICAL SUPPLY — 25 items
APL SWBSTK 6 STRL LF DISP (MISCELLANEOUS) ×3
APPLICATOR COTTON TIP 6 STRL (MISCELLANEOUS) IMPLANT
APPLICATOR COTTON TIP 6IN STRL (MISCELLANEOUS) ×6
BACTOSHIELD CHG 4% 4OZ (MISCELLANEOUS) ×1
CUP MEDICINE 2OZ PLAST GRAD ST (MISCELLANEOUS) ×3 IMPLANT
DEVICE MYOSURE REACH (MISCELLANEOUS) ×2 IMPLANT
DRSG TELFA 3X8 NADH (GAUZE/BANDAGES/DRESSINGS) ×2 IMPLANT
ELECT REM PT RETURN 9FT ADLT (ELECTROSURGICAL) ×2
ELECTRODE REM PT RTRN 9FT ADLT (ELECTROSURGICAL) ×1 IMPLANT
GLOVE BIO SURGEON STRL SZ7 (GLOVE) ×2 IMPLANT
GLOVE SURG UNDER POLY LF SZ7.5 (GLOVE) ×2 IMPLANT
GOWN STRL REUS W/ TWL LRG LVL3 (GOWN DISPOSABLE) ×2 IMPLANT
GOWN STRL REUS W/TWL LRG LVL3 (GOWN DISPOSABLE) ×4
IV NS IRRIG 3000ML ARTHROMATIC (IV SOLUTION) ×2 IMPLANT
KIT PROCEDURE FLUENT (KITS) ×2 IMPLANT
KIT TURNOVER CYSTO (KITS) ×2 IMPLANT
MANIFOLD NEPTUNE II (INSTRUMENTS) ×2 IMPLANT
PACK DNC HYST (MISCELLANEOUS) ×2 IMPLANT
PAD DRESSING TELFA 3X8 NADH (GAUZE/BANDAGES/DRESSINGS) IMPLANT
PAD OB MATERNITY 4.3X12.25 (PERSONAL CARE ITEMS) ×2 IMPLANT
PAD PREP 24X41 OB/GYN DISP (PERSONAL CARE ITEMS) ×2 IMPLANT
SCRUB CHG 4% DYNA-HEX 4OZ (MISCELLANEOUS) ×1 IMPLANT
SEAL ROD LENS SCOPE MYOSURE (ABLATOR) ×2 IMPLANT
SURGILUBE 2OZ TUBE FLIPTOP (MISCELLANEOUS) ×2 IMPLANT
WATER STERILE IRR 500ML POUR (IV SOLUTION) ×2 IMPLANT

## 2022-04-12 NOTE — Anesthesia Procedure Notes (Signed)
Procedure Name: Intubation Date/Time: 04/12/2022 10:39 AM Performed by: Lerry Liner, CRNA Pre-anesthesia Checklist: Patient identified, Emergency Drugs available, Suction available and Patient being monitored Patient Re-evaluated:Patient Re-evaluated prior to induction Oxygen Delivery Method: Circle system utilized Preoxygenation: Pre-oxygenation with 100% oxygen Induction Type: IV induction Ventilation: Mask ventilation without difficulty Laryngoscope Size: McGraph and 3 Grade View: Grade II Tube type: Oral Tube size: 7.0 mm Number of attempts: 1 Airway Equipment and Method: Stylet and Oral airway Placement Confirmation: ETT inserted through vocal cords under direct vision, positive ETCO2 and breath sounds checked- equal and bilateral Secured at: 21 cm Tube secured with: Tape Dental Injury: Teeth and Oropharynx as per pre-operative assessment

## 2022-04-12 NOTE — H&P (Signed)
Preoperative History and Physical  [name] is a 56 y.o. No obstetric history on file. here for surgical management of postmenopausal bleeding with a thickened endometrial stripe and an abnormal pap smear with a result of ASCUS, HPV+.   No significant preoperative concerns.  History of Present Illness: 56 y.o. female who was originally seen in referral from Aura Dials, [place], from [facility] for vaginal prolapse and urinary frequency.    During her appointment she noted that she had some vaginal spotting two prior to her initial appointment.  This was very light pink tinge. She had just a little this morning since then.  She denies unintentional weight loss. She has gained a bunch of weight since Covid.  She had covid and had long Covid. She denies new constipation and bloating.  She was taking Ozempic. She is taking Trulicty.  So, she has a mild early satiety.    Her most recent pap smear returned as ASCUS, HPV + (02/16/2022)  She had a pelvic ultrasound on 04/03/22: Ultrasound demonstrates the following findings Adnexa: no masses seen  Uterus: anteverted with endometrial stripe  19.9 mm Additional: cystic areas within endometrial cavity.   She has had what she called a full-blown period between our first and second visit. She had no menses for several years prior to her most recent episodes.   Proposed surgery: Colposcopy, hysteroscopy with dilation and curettage  Past Medical History:  Diagnosis Date   Complication of anesthesia    pt gasping for air after surgery   Concussion 2008   Fibromyalgia    Headache    migraine   Hx of migraines    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HCC)    Viral meningitis    Past Surgical History:  Procedure Laterality Date   LAPAROSCOPIC APPENDECTOMY N/A 10/02/2019   Procedure: APPENDECTOMY LAPAROSCOPIC;  Surgeon: Henrene Dodge, MD;  Location: [facility] ORS;  Service: General;  Laterality: N/A;   OB History  No obstetric history on file.   Patient denies any other pertinent gynecologic issues.   No current facility-administered medications on file prior to encounter.   Current Outpatient Medications on File Prior to Encounter  Medication Sig Dispense Refill   indomethacin (INDOCIN) 50 MG capsule indomethacin 50 mg capsule     magnesium oxide (MAG-OX) 400 MG tablet Take 400 mg by mouth daily.     metFORMIN (GLUCOPHAGE) 500 MG tablet Take 500 mg by mouth daily. (Patient not taking: Reported on 04/07/2022)     TRULICITY 1.5 MG/0.5ML SOPN SMARTSIG:0.5 Milliliter(s) SUB-Q Once a Week (Patient not taking: Reported on 04/07/2022)     Allergies  Allergen Reactions   Influenza A (H1n1) Monovalent Vaccine     Fever and aches and flu symptoms and n/v   Vancomycin Rash    Red man reaction    Social History:   reports that she quit smoking about 8 years ago. Her smoking use included cigarettes. She has a 0.75 pack-year smoking history. She has never used smokeless tobacco. She reports current alcohol use of about 1.0 standard drink per week. She reports that she does not use drugs.  Family History  Problem Relation Age of Onset   Hyperlipidemia Mother    Heart Problems Father    Healthy Sister    Autism spectrum disorder Son    Autism spectrum disorder Son    Autism spectrum disorder Son    Breast cancer Neg Hx     Review of Systems: Noncontributory  PHYSICAL EXAM: Last menstrual period  08/25/2015. CONSTITUTIONAL: Well-developed, well-nourished female in no acute distress.  HENT:  Normocephalic, atraumatic, External right and left ear normal. Oropharynx is clear and moist EYES: Conjunctivae and EOM are normal. Pupils are equal, round, and reactive to light. No scleral icterus.  NECK: Normal range of motion, supple, no masses SKIN: Skin is warm and dry. No rash noted. Not diaphoretic. No erythema. No pallor. NEUROLGIC: Alert and oriented to person, place, and time. Normal reflexes, muscle tone coordination. No cranial nerve  deficit noted. PSYCHIATRIC: Normal mood and affect. Normal behavior. Normal judgment and thought content. CARDIOVASCULAR: Normal heart rate noted, regular rhythm RESPIRATORY: Effort and breath sounds normal, no problems with respiration noted ABDOMEN: Soft, nontender, nondistended. PELVIC: Deferred MUSCULOSKELETAL: Normal range of motion. No edema and no tenderness. 2+ distal pulses.  Labs: Results for orders placed or performed during the hospital encounter of 04/07/22 (from the past 336 hour(s))  Type and screen [facility]   Collection Time: 04/07/22  2:53 PM  Result Value Ref Range   ABO/RH(D) O POS    Antibody Screen NEG    Sample Expiration 04/21/2022,2359    Extend sample reason      NO TRANSFUSIONS OR PREGNANCY IN THE PAST 3 MONTHS Performed at [facility], [address], [place], [place] [address]     Imaging Studies: No results found.  Assessment: Postmenopausal bleeding [N95.0]    -  Primary Thickened endometrium  [R93.89] Atypical squamous cell changes of undetermined significance (ASCUS) on cervical cytology with positive high risk human papilloma virus (HPV)   [R87.610, R87.810]    Plan: Patient will undergo surgical management with the above-noted procedure.   The risks of surgery were discussed in detail with the patient including but not limited to: bleeding which may require transfusion or reoperation; infection which may require antibiotics; injury to surrounding organs which may involve bowel, bladder, ureters ; need for additional procedures including laparoscopy or laparotomy; thromboembolic phenomenon, surgical site problems and other postoperative/anesthesia complications. Likelihood of success in alleviating the patient's condition was discussed. Routine postoperative instructions will be reviewed with the patient and her family in detail after surgery.  The patient concurred with the proposed plan, giving informed written  consent for the surgery.  Preoperative prophylactic antibiotics, as indicated, and SCDs ordered on call to the OR.    Thomasene Mohair, MD 04/12/2022 9:24 AM

## 2022-04-12 NOTE — Anesthesia Preprocedure Evaluation (Signed)
Anesthesia Evaluation  Patient identified by MRN, date of birth, ID band Patient awake    Reviewed: Allergy & Precautions, NPO status , Patient's Chart, lab work & pertinent test results  History of Anesthesia Complications (+) history of anesthetic complications  Airway Mallampati: III  TM Distance: <3 FB Neck ROM: full    Dental  (+) Chipped   Pulmonary neg shortness of breath, former smoker,    Pulmonary exam normal        Cardiovascular Exercise Tolerance: Good (-) angina(-) Past MI and (-) DOE negative cardio ROS Normal cardiovascular exam     Neuro/Psych  Headaches,  Neuromuscular disease negative psych ROS   GI/Hepatic negative GI ROS, Neg liver ROS, neg GERD  ,  Endo/Other  negative endocrine ROS  Renal/GU      Musculoskeletal   Abdominal   Peds  Hematology negative hematology ROS (+)   Anesthesia Other Findings Past Medical History: No date: Complication of anesthesia     Comment:  pt gasping for air after surgery 2008: Concussion No date: Fibromyalgia No date: Headache     Comment:  migraine No date: Hx of migraines No date: 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[place]) No date: Viral meningitis  Past Surgical History: 10/02/2019: LAPAROSCOPIC APPENDECTOMY; N/A     Comment:  Procedure: APPENDECTOMY LAPAROSCOPIC;  Surgeon: Olean Ree, MD;  Location: [facility] ORS;  Service: General;                Laterality: N/A;     Reproductive/Obstetrics negative OB ROS                             Anesthesia Physical Anesthesia Plan  ASA: 3  Anesthesia Plan: General ETT   Post-op Pain Management:    Induction: Intravenous  PONV Risk Score and Plan: Ondansetron, Dexamethasone, Midazolam and Treatment may vary due to age or medical condition  Airway Management Planned: Oral ETT  Additional Equipment:   Intra-op Plan:   Post-operative Plan: Extubation in OR  Informed Consent:  I have reviewed the patients History and Physical, chart, labs and discussed the procedure including the risks, benefits and alternatives for the proposed anesthesia with the patient or authorized representative who has indicated his/her understanding and acceptance.     Dental Advisory Given  Plan Discussed with: Anesthesiologist, CRNA and Surgeon  Anesthesia Plan Comments: (Patient consented for risks of anesthesia including but not limited to:  - adverse reactions to medications - damage to eyes, teeth, lips or other oral mucosa - nerve damage due to positioning  - sore throat or hoarseness - Damage to heart, brain, nerves, lungs, other parts of body or loss of life  Patient voiced understanding.)        Anesthesia Quick Evaluation

## 2022-04-12 NOTE — Discharge Instructions (Signed)
AMBULATORY SURGERY  ?DISCHARGE INSTRUCTIONS ? ? ?The drugs that you were given will stay in your system until tomorrow so for the next 24 hours you should not: ? ?Drive an automobile ?Make any legal decisions ?Drink any alcoholic beverage ? ? ?You may resume regular meals tomorrow.  Today it is better to start with liquids and gradually work up to solid foods. ? ?You may eat anything you prefer, but it is better to start with liquids, then soup and crackers, and gradually work up to solid foods. ? ? ?Please notify your doctor immediately if you have any unusual bleeding, trouble breathing, redness and pain at the surgery site, drainage, fever, or pain not relieved by medication. ? ? ? ?Additional Instructions: ? ? ? ?Please contact your physician with any problems or Same Day Surgery at [phone], Monday through Friday 6 am to 4 pm, or [facility] Main number at [phone].  ?

## 2022-04-12 NOTE — Transfer of Care (Signed)
Immediate Anesthesia Transfer of Care Note  Patient: [name]  Procedure(s) Performed: COLPOSCOPY DILATATION & CURETTAGE/HYSTEROSCOPY WITH MYOSURE, POLYPECTOMY X2  Patient Location: PACU  Anesthesia Type:General  Level of Consciousness: drowsy  Airway & Oxygen Therapy: Patient Spontanous Breathing and Patient connected to face mask oxygen  Post-op Assessment: Report given to RN  Post vital signs: stable  Last Vitals:  Vitals Value Taken Time  BP    Temp    Pulse    Resp    SpO2      Last Pain:  Vitals:   04/12/22 1007  TempSrc: Oral  PainSc: 0-No pain         Complications: No notable events documented.

## 2022-04-12 NOTE — Anesthesia Postprocedure Evaluation (Signed)
Anesthesia Post Note  Patient: MAUDEAN HOFFMANN  Procedure(s) Performed: COLPOSCOPY DILATATION & CURETTAGE/HYSTEROSCOPY WITH MYOSURE, POLYPECTOMY X2  Patient location during evaluation: PACU Anesthesia Type: General Level of consciousness: awake and alert Pain management: pain level controlled Vital Signs Assessment: post-procedure vital signs reviewed and stable Respiratory status: spontaneous breathing, nonlabored ventilation, respiratory function stable and patient connected to nasal cannula oxygen Cardiovascular status: blood pressure returned to baseline and stable Postop Assessment: no apparent nausea or vomiting Anesthetic complications: no   No notable events documented.   Last Vitals:  Vitals:   04/12/22 1217 04/12/22 1227  BP:  (!) 147/76  Pulse: 63   Resp: 14   Temp: 36.5 C   SpO2: 98%     Last Pain:  Vitals:   04/12/22 1217  TempSrc: Temporal  PainSc: 0-No pain                 Cleda Mccreedy Jeanifer Halliday

## 2022-04-12 NOTE — Op Note (Signed)
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 Colposcopy of cervix and upper/adjacent vagina with biopsies of cervix and endocervical curettage 2. Dilation and curettage with hysteroscopy and endometrial polypectomy  Primary Surgeon: Thomasene Mohair, MD   EBL: 25 mL   IVF: 500 mL   Urine output: 20 mL  Specimens:  1) Cervical colposcopic biopsies at 4 o'clock, 6 o'clock, and 12 o'clock, with endocervical curettings. 2) Endometrial curettings 3) Endometrial polyps  Drains: none  Complications: None   Disposition: PACU   Condition: Stable   Fluid deficit: 90 mL  Findings:  1) Cervix with mild diffuse acetowhite changes 2) Uterine cavity with multiple broad based polypoid lesions at the left posterior wall and the right lateral wall slightly anterior.  Otherwise, the uterine cavity had a normal contour with normal appearing tubal ostia.  Procedure Summary:  After informed consent was obtained, the patient was taken to the operating room where anesthesia was obtained without difficulty. The patient was positioned in the dorsal lithotomy position in [place] stirrups.  A time out was called.   The patient's bladder was catheterized with an in and out foley catheter.  The patient was examined under anesthesia, with the above noted findings.  The bi-valved speculum was placed inside the  patient's vagina. The colposcope was used to visualize the cervix using different magnifications and a green light filter. Acetic acid 3% was applied to the cervix with the above-noted findings.  Biopsies were taken at 4 o'clock, 6 o'clock, and 12 o'clock.  An endocervical curettage was also performed due to inadequate colposcopy.   Monsel's solution was applied to the biopsy sites. Each biopsy was placed in formalin and labeled accordingly.    The patient was then prepped and draped in the usual, sterile fashion.  A sterile speculum was placed in her vagina. The cervix was visualized and the the anterior lip of the cervix was grasped with the tenaculum.  The cervix was progressively dilated to a 7 mm Hegar dilator.  The hysteroscope was introduced, with the above noted findings.  The MyoSure  device was used to remove the polyps.  The hystersocope was removed and the uterine cavity was curetted until a gritty texture was noted, yielding moderate endometrial curettings.  The hysteroscope was re-introduced to verify hemostasis.  The cavity was found to be clear of polyps.  The pressure used on the Fluent system was gradually lowered to the lowest level and a level that was well below that of the MAP with continued hemostasis noted.  The uterus was drained of fluid.    The tenaculum was removed from the cervix.  Additional Monsel's solution was applied to the cervix with hemostasis noted at the tenaculum entry sites and the cervical biopsy sites.  The vagina was inspected and found to be clear of any instruments or sponges. The speculum was removed.   The patient tolerated the procedure well.  Sponge, lap, needle, and instrument counts were correct x 2.  VTE prophylaxis: SCDs. Antibiotic prophylaxis:  none indicated and none given. She was awakened in the operating room and was taken to the PACU in stable condition.   Thomasene Mohair, MD 04/12/2022 11:32 AM

## 2022-04-13 ENCOUNTER — Encounter: Payer: Self-pay | Admitting: Obstetrics and Gynecology

## 2022-04-13 LAB — SURGICAL PATHOLOGY

## 2022-06-01 ENCOUNTER — Encounter: Payer: Self-pay | Admitting: Nurse Practitioner

## 2022-06-01 MED ORDER — INDOMETHACIN 50 MG PO CAPS: ORAL_CAPSULE | ORAL | 12 refills | Status: DC

## 2022-06-01 NOTE — Addendum Note (Signed)
Addended by: Aura Dials T on: 06/01/2022 03:19 PM   Modules accepted: Orders

## 2022-06-11 NOTE — Patient Instructions (Signed)

## 2022-06-15 ENCOUNTER — Telehealth: Payer: Self-pay

## 2022-06-15 ENCOUNTER — Ambulatory Visit: Payer: BC Managed Care – PPO | Admitting: Nurse Practitioner

## 2022-06-15 ENCOUNTER — Encounter: Payer: Self-pay | Admitting: Nurse Practitioner

## 2022-06-15 DIAGNOSIS — Z6841 Body Mass Index (BMI) 40.0 and over, adult: Secondary | ICD-10-CM

## 2022-06-15 MED ORDER — OZEMPIC (0.25 OR 0.5 MG/DOSE) 2 MG/1.5ML ~~LOC~~ SOPN: PEN_INJECTOR | SUBCUTANEOUS | 1 refills | Status: DC

## 2022-06-15 NOTE — Telephone Encounter (Signed)
Prior authorization was initiated via CoverMyMeds for prescription for Ozempic 0.25 MG or 0.5 MG. Awaiting determination from insurance.   KEY: [id number]

## 2022-06-15 NOTE — Progress Notes (Signed)
BP 115/76   Pulse 69   Temp 98.5 F (36.9 C) (Oral)   Ht 5\' 9"  (1.753 m)   Wt 291 lb 6.4 oz (132.2 kg)   LMP 08/25/2015   SpO2 98%   BMI 43.03 kg/m    Subjective:    Patient ID: [name], female    DOB: Jul 18, 1966, 56 y.o.   MRN: [id number]  HPI: [name] is a 56 y.o. female  Chief Complaint  Patient presents with   Medication Management    Patient says she was on Ozempic last year and had issues with the company. Patient says she has been off the medication for at least seven months. Patient says she is interested in restart the medication, as it helped significantly with her weight loss.    OBESITY: Was on Ozempic last year via online company [employer]), has now been off 7 months due to issues with company and would like to restart if possible.  She tolerated Ozempic well with weight loss presenting, but the Ozempic was not consistent.  She lost 35 pounds when on Ozempic.  They tried to send Trulicity, but this made her feel horrible.  Has been trying for months, 6 months or more to lose weight.  Working on diet and exercise.  She would like to be around 190 to 200 lbs.  She was on Metformin prior to Ozempic via the weight loss program and did not lose much with this.  Has coverage for Ozempic under her insurance in past, since she tried the Metformin initially.  No family history of thyroid cancer or personal history of pancreatitis.  Relevant past medical, surgical, family and social history reviewed and updated as indicated. Interim medical history since our last visit reviewed. Allergies and medications reviewed and updated.  Review of Systems  Constitutional:  Negative for activity change, appetite change, diaphoresis, fatigue and fever.  Respiratory:  Negative for cough, chest tightness, shortness of breath and wheezing.   Cardiovascular:  Negative for chest pain, palpitations and leg swelling.  Gastrointestinal: Negative.   Endocrine: Negative.    Neurological: Negative.   Psychiatric/Behavioral: Negative.      Per HPI unless specifically indicated above     Objective:    BP 115/76   Pulse 69   Temp 98.5 F (36.9 C) (Oral)   Ht 5\' 9"  (1.753 m)   Wt 291 lb 6.4 oz (132.2 kg)   LMP 08/25/2015   SpO2 98%   BMI 43.03 kg/m   Wt Readings from Last 3 Encounters:  06/15/22 291 lb 6.4 oz (132.2 kg)  04/12/22 285 lb 0.9 oz (129.3 kg)  04/07/22 285 lb (129.3 kg)    Physical Exam Vitals and nursing note reviewed.  Constitutional:      General: She is awake. She is not in acute distress.    Appearance: She is well-developed and well-groomed. She is obese. She is not ill-appearing or toxic-appearing.  HENT:     Head: Normocephalic.     Right Ear: Hearing normal.     Left Ear: Hearing normal.  Eyes:     General: Lids are normal.        Right eye: No discharge.        Left eye: No discharge.     Conjunctiva/sclera: Conjunctivae normal.     Pupils: Pupils are equal, round, and reactive to light.  Neck:     Thyroid: No thyromegaly.     Vascular: No carotid bruit.  Cardiovascular:  Rate and Rhythm: Normal rate and regular rhythm.     Heart sounds: Normal heart sounds. No murmur heard.    No gallop.  Pulmonary:     Effort: Pulmonary effort is normal. No accessory muscle usage or respiratory distress.     Breath sounds: Normal breath sounds.  Abdominal:     General: Bowel sounds are normal.     Palpations: Abdomen is soft.  Musculoskeletal:     Cervical back: Normal range of motion and neck supple.     Right lower leg: No edema.     Left lower leg: No edema.  Lymphadenopathy:     Head:     Right side of head: No submental, submandibular, tonsillar, preauricular or posterior auricular adenopathy.     Left side of head: No submental, submandibular, tonsillar, preauricular or posterior auricular adenopathy.     Cervical: No cervical adenopathy.  Skin:    General: Skin is warm and dry.  Neurological:     Mental  Status: She is alert and oriented to person, place, and time.  Psychiatric:        Attention and Perception: Attention normal.        Mood and Affect: Mood normal.        Speech: Speech normal.        Behavior: Behavior normal. Behavior is cooperative.        Thought Content: Thought content normal.    Results for orders placed or performed during the hospital encounter of 04/12/22  ABO/Rh  Result Value Ref Range   ABO/RH(D)      O POS Performed at [facility], [address], [place], [place] [address]   Surgical pathology  Result Value Ref Range   SURGICAL PATHOLOGY      SURGICAL PATHOLOGY CASE: [phone] PATIENT: Danley Danker Surgical Pathology Report     Specimen Submitted: A. Cervix, 6:00 B. Cervix, 4:00 C. Cervix, 12:00 D. Endocervical curettings E. Endometrial curettings F. Endometrial polyps  Clinical History: Post menopausal bleeding, thickened endometrium, ASCUS, HPV, pap smear      DIAGNOSIS: A.  CERVIX, 6:00; BIOPSY: - LOW-GRADE SQUAMOUS INTRAEPITHELIAL LESION (CIN-1HPV EFFECT). - SQUAMOCOLUMNAR JUNCTION PRESENT.  B.  CERVIX, 4:00; BIOPSY: - SCANT SQUAMOUS AND ENDOCERVICAL MUCOSA WITH NO DYSPLASIA.  C.  CERVIX, 12:00; BIOPSIES: - LOW-GRADE SQUAMOUS INTRAEPITHELIAL LESION (CIN-1HPV EFFECT). - SQUAMOCOLUMNAR JUNCTION PRESENT.  D.  UTERUS, ENDOCERVIX; CURETTAGE: - ENDOCERVICAL MUCOSA WITH NO DYSPLASIA.  E.  UTERUS, ENDOMETRIUM, CURETTAGE: - BENIGN DISORDERED ENDOMETRIUM WITH SCATTERED TUBAL METAPLASIA AND FOCAL BREAKDOWN. - ADMIXED FRAGMENTS WITH OBSCURING ARTIFACT. - NO EVIDENCE OF ATYPICAL HYPERPL ASIA OR MALIGNANCY.  F.  UTERUS, ENDOMETRIUM, POLYPS; POLYPECTOMY: - MULTIPLE FRAGMENTS OF BENIGN ENDOMETRIAL POLYP. - NO EVIDENCE OF ATYPICAL HYPERPLASIA OR MALIGNANCY.   GROSS DESCRIPTION: A. Labeled: Cervical biopsy at 6:00 Received: Formalin Collection time: 11 AM on 04/12/2022 Placed into formalin time: 11 AM on  04/12/2022 Tissue fragment(s): Multiple Size: Aggregate, 0.9 x 0.5 x 0.2 cm Description: Received is a fragment of tan-pink soft tissue and fragments of colorless gelatinous translucent material. Entirely submitted in 1 cassette.  B. Labeled: Cervical biopsy 4:00 Received: Formalin Collection time: 11:02 AM on 04/12/2022 Placed into formalin time: 11:02 AM on 04/12/2022 Tissue fragment(s): 2 Size: Range from 0.5-0.7 cm Description: Received is a fragment of tan soft tissue and a fragment of colorless gelatinous material. Entirely submitted in 1 cassette.  C. Labeled: Cervical biopsy 12:00 Received: Formalin Collection time: 11:04 AM on 04/12/2022  Placed into formalin  time: 11:04 AM on 04/12/2022 Tissue fragment(s): Multiple Size: Aggregate, 1.5 x 0.6 x 0.2 cm Description: Received is a fragment of tan soft tissue and fragments of colorless gelatinous translucent material. Entirely submitted in 1 cassette.  D. Labeled: Endocervical curettings Received: Formalin Collection time: 11:06 AM on 04/12/2022 Placed into formalin time: 11:06 AM on 04/12/2022 Tissue fragment(s): Multiple Size: Aggregate, 1.2 x 0.3 x 0.2 cm Description: Received on Telfa pad are fragments of red soft tissue and colorless translucent gelatinous material. Entirely submitted in 1 cassette.  E. Labeled: Endometrial curettings Received: Fresh Collection time: 11:20 AM on 04/12/2022 Placed into formalin time: 11:47 AM on 04/12/2022 Tissue fragment(s): Multiple Size: Aggregate, 2.8 x 1.9 x 0.3 cm Description: Received on a Telfa pad are fragments of red-brown soft tissue and blood clot. Entirely submitted in 1 cassette.  F. Labeled: Endometr ial polyps Received: Fresh Collection time: 11:21 AM on 04/12/2022 Placed into formalin time: 11:47 AM on 04/12/2022 Tissue fragment(s): Multiple Size: Aggregate, 2.8 x 2.4 x 0.4 cm Description: Received in a white mesh collection bag are fragments of tan soft tissue and  blood clot. Entirely submitted in 1 cassette.  RB 04/12/2022  Final Diagnosis performed by Alcario Drought, MD.   Electronically signed 04/13/2022 12:31:59PM The electronic signature indicates that the named Attending Pathologist has evaluated the specimen Technical component performed at [facility], [address] [place], [place] [address] Lab: [phone] Dir: Balin Vandegrift Schimke, MD, MMM  Professional component performed at [facility], [facility], [address] [place], [place], [place] [address] Lab: [phone] Dir: Beryle Quant, MD       Assessment & Plan:   Problem List Items Addressed This Visit       Other   Obesity    BMI 43.03, in past insurance covered Ozempic for her and she would like to restart this, this is reasonable as Reginal Lutes not currently available and on back order.  Will restart Ozempic, as offered benefit in past.  Recommended eating smaller high protein, low fat meals more frequently and exercising 30 mins a day 5 times a week with a goal of 10-15lb weight loss in the next 3 months. Patient voiced their understanding and motivation to adhere to these recommendations.       Relevant Medications   Semaglutide,0.25 or 0.5MG /DOS, (OZEMPIC, 0.25 OR 0.5 MG/DOSE,) 2 MG/1.5ML SOPN     Follow up plan: Return in about 7 weeks (around 08/02/2022) for WEIGHT CHECK.

## 2022-06-15 NOTE — Assessment & Plan Note (Signed)
BMI 43.03, in past insurance covered Ozempic for her and she would like to restart this, this is reasonable as Reginal Lutes not currently available and on back order.  Will restart Ozempic, as offered benefit in past.  Recommended eating smaller high protein, low fat meals more frequently and exercising 30 mins a day 5 times a week with a goal of 10-15lb weight loss in the next 3 months. Patient voiced their understanding and motivation to adhere to these recommendations.

## 2022-06-15 NOTE — Addendum Note (Signed)
Addended by: Aura Dials T on: 06/15/2022 11:58 AM   Modules accepted: Orders

## 2022-06-19 NOTE — Telephone Encounter (Signed)
Prior authorization was approved via CoverMyMeds.  

## 2022-07-19 ENCOUNTER — Telehealth: Payer: Self-pay

## 2022-07-19 NOTE — Telephone Encounter (Signed)
Called and LVM asking for patient to please return my call. Patient is overdue for mammogram. Can call and schedule exam for the patient if she would like.   OK for PEC to speak with the patient and find out if she would like to have this done.

## 2022-07-26 NOTE — Telephone Encounter (Signed)
See my chart message

## 2022-07-27 ENCOUNTER — Encounter: Payer: Self-pay | Admitting: Nurse Practitioner

## 2022-07-27 ENCOUNTER — Ambulatory Visit (INDEPENDENT_AMBULATORY_CARE_PROVIDER_SITE_OTHER): Payer: BC Managed Care – PPO | Admitting: Nurse Practitioner

## 2022-07-27 DIAGNOSIS — Z6841 Body Mass Index (BMI) 40.0 and over, adult: Secondary | ICD-10-CM

## 2022-07-27 MED ORDER — SEMAGLUTIDE (1 MG/DOSE) 4 MG/3ML ~~LOC~~ SOPN: 1.0000 mg | PEN_INJECTOR | SUBCUTANEOUS | 2 refills | Status: DC

## 2022-07-27 NOTE — Patient Instructions (Signed)

## 2022-07-27 NOTE — Assessment & Plan Note (Signed)
BMI 42.04 and has lost 7 pounds thus far on Ozempic which is covered by her insurance.  Will continue this and increase to 1 MG dosing to start in 4 weeks once completed 0.5 MG dosing.  She is tolerating well and losing weight.  Recommended eating smaller high protein, low fat meals more frequently and exercising 30 mins a day 5 times a week with a goal of 10-15lb weight loss in the next 3 months. Patient voiced their understanding and motivation to adhere to these recommendations.

## 2022-07-27 NOTE — Progress Notes (Signed)
BP 132/80   Pulse 70   Temp 98.3 F (36.8 C) (Oral)   Ht 5\' 9"  (1.753 m)   Wt 284 lb 11.2 oz (129.1 kg)   LMP 08/25/2015   SpO2 95%   BMI 42.04 kg/m    Subjective:    Patient ID: [name], female    DOB: [birth year], 56 y.o.   MRN: [id number]  HPI: [name] is a 56 y.o. female  Chief Complaint  Patient presents with   Weight Check    Patient is here for Weight Check on follow up visit. Patient denies having any concerns at today's visit.    OBESITY: Restarted Ozempic 0.25/0.5 MG dosing on 06/15/22.  Had taken before via online company and lost 35 pounds when on Ozempic.  They tried to send Trulicity, but this made her feel horrible.  Has been trying for months, 6 months or more to lose weight. Working on diet and exercise.  She would like to be around 190 to 200 lbs.  She was on Metformin prior to Ozempic via the weight loss program and did not lose much with this.  Has coverage for Ozempic under her insurance in past, since she tried the Metformin initially.  No family history of thyroid cancer or personal history of pancreatitis. Currently has lost 7 pounds since restarting, she was on vacation during this time -- was walking a lot during that time.  Is currently on 0.5 MG dosing, is on week 1 of this.  Is overall tolerating, with exception of occasional heart burn.     Relevant past medical, surgical, family and social history reviewed and updated as indicated. Interim medical history since our last visit reviewed. Allergies and medications reviewed and updated.  Review of Systems  Constitutional:  Negative for activity change, appetite change, diaphoresis, fatigue and fever.  Respiratory:  Negative for cough, chest tightness, shortness of breath and wheezing.   Cardiovascular:  Negative for chest pain, palpitations and leg swelling.  Gastrointestinal: Negative.   Endocrine: Negative.   Neurological: Negative.   Psychiatric/Behavioral: Negative.      Per HPI  unless specifically indicated above     Objective:    BP 132/80   Pulse 70   Temp 98.3 F (36.8 C) (Oral)   Ht 5\' 9"  (1.753 m)   Wt 284 lb 11.2 oz (129.1 kg)   LMP 08/25/2015   SpO2 95%   BMI 42.04 kg/m   Wt Readings from Last 3 Encounters:  07/27/22 284 lb 11.2 oz (129.1 kg)  06/15/22 291 lb 6.4 oz (132.2 kg)  04/12/22 285 lb 0.9 oz (129.3 kg)    Physical Exam Vitals and nursing note reviewed.  Constitutional:      General: She is awake. She is not in acute distress.    Appearance: She is well-developed and well-groomed. She is obese. She is not ill-appearing or toxic-appearing.  HENT:     Head: Normocephalic.     Right Ear: Hearing normal.     Left Ear: Hearing normal.  Eyes:     General: Lids are normal.        Right eye: No discharge.        Left eye: No discharge.     Conjunctiva/sclera: Conjunctivae normal.     Pupils: Pupils are equal, round, and reactive to light.  Neck:     Thyroid: No thyromegaly.     Vascular: No carotid bruit.  Cardiovascular:     Rate and Rhythm: Normal  rate and regular rhythm.     Heart sounds: Normal heart sounds. No murmur heard.    No gallop.  Pulmonary:     Effort: Pulmonary effort is normal. No accessory muscle usage or respiratory distress.     Breath sounds: Normal breath sounds.  Abdominal:     General: Bowel sounds are normal.     Palpations: Abdomen is soft.  Musculoskeletal:     Cervical back: Normal range of motion and neck supple.     Right lower leg: No edema.     Left lower leg: No edema.  Lymphadenopathy:     Cervical: No cervical adenopathy.  Skin:    General: Skin is warm and dry.  Neurological:     Mental Status: She is alert and oriented to person, place, and time.  Psychiatric:        Attention and Perception: Attention normal.        Mood and Affect: Mood normal.        Speech: Speech normal.        Behavior: Behavior normal. Behavior is cooperative.        Thought Content: Thought content normal.     Results for orders placed or performed during the hospital encounter of 04/12/22  ABO/Rh  Result Value Ref Range   ABO/RH(D)      O POS Performed at [facility], [address] [place], [place] [address]   Surgical pathology  Result Value Ref Range   SURGICAL PATHOLOGY      SURGICAL PATHOLOGY CASE: [id number] PATIENT: [name] Surgical Pathology Report     Specimen Submitted: A. Cervix, 6:00 B. Cervix, 4:00 C. Cervix, 12:00 D. Endocervical curettings E. Endometrial curettings F. Endometrial polyps  Clinical History: Post menopausal bleeding, thickened endometrium, ASCUS, HPV, pap smear      DIAGNOSIS: A.  CERVIX, 6:00; BIOPSY: - LOW-GRADE SQUAMOUS INTRAEPITHELIAL LESION (CIN-1HPV EFFECT). - SQUAMOCOLUMNAR JUNCTION PRESENT.  B.  CERVIX, 4:00; BIOPSY: - SCANT SQUAMOUS AND ENDOCERVICAL MUCOSA WITH NO DYSPLASIA.  C.  CERVIX, 12:00; BIOPSIES: - LOW-GRADE SQUAMOUS INTRAEPITHELIAL LESION (CIN-1HPV EFFECT). - SQUAMOCOLUMNAR JUNCTION PRESENT.  D.  UTERUS, ENDOCERVIX; CURETTAGE: - ENDOCERVICAL MUCOSA WITH NO DYSPLASIA.  E.  UTERUS, ENDOMETRIUM, CURETTAGE: - BENIGN DISORDERED ENDOMETRIUM WITH SCATTERED TUBAL METAPLASIA AND FOCAL BREAKDOWN. - ADMIXED FRAGMENTS WITH OBSCURING ARTIFACT. - NO EVIDENCE OF ATYPICAL HYPERPL ASIA OR MALIGNANCY.  F.  UTERUS, ENDOMETRIUM, POLYPS; POLYPECTOMY: - MULTIPLE FRAGMENTS OF BENIGN ENDOMETRIAL POLYP. - NO EVIDENCE OF ATYPICAL HYPERPLASIA OR MALIGNANCY.   GROSS DESCRIPTION: A. Labeled: Cervical biopsy at 6:00 Received: Formalin Collection time: 11 AM on 04/12/2022 Placed into formalin time: 11 AM on 04/12/2022 Tissue fragment(s): Multiple Size: Aggregate, 0.9 x 0.5 x 0.2 cm Description: Received is a fragment of tan-pink soft tissue and fragments of colorless gelatinous translucent material. Entirely submitted in 1 cassette.  B. Labeled: Cervical biopsy 4:00 Received: Formalin Collection time: 11:02 AM  on 04/12/2022 Placed into formalin time: 11:02 AM on 04/12/2022 Tissue fragment(s): 2 Size: Range from 0.5-0.7 cm Description: Received is a fragment of tan soft tissue and a fragment of colorless gelatinous material. Entirely submitted in 1 cassette.  C. Labeled: Cervical biopsy 12:00 Received: Formalin Collection time: 11:04 AM on 04/12/2022  Placed into formalin time: 11:04 AM on 04/12/2022 Tissue fragment(s): Multiple Size: Aggregate, 1.5 x 0.6 x 0.2 cm Description: Received is a fragment of tan soft tissue and fragments of colorless gelatinous translucent material. Entirely submitted in 1 cassette.  D. Labeled: Endocervical curettings Received:  Formalin Collection time: 11:06 AM on 04/12/2022 Placed into formalin time: 11:06 AM on 04/12/2022 Tissue fragment(s): Multiple Size: Aggregate, 1.2 x 0.3 x 0.2 cm Description: Received on Telfa pad are fragments of red soft tissue and colorless translucent gelatinous material. Entirely submitted in 1 cassette.  E. Labeled: Endometrial curettings Received: Fresh Collection time: 11:20 AM on 04/12/2022 Placed into formalin time: 11:47 AM on 04/12/2022 Tissue fragment(s): Multiple Size: Aggregate, 2.8 x 1.9 x 0.3 cm Description: Received on a Telfa pad are fragments of red-brown soft tissue and blood clot. Entirely submitted in 1 cassette.  F. Labeled: Endometr ial polyps Received: Fresh Collection time: 11:21 AM on 04/12/2022 Placed into formalin time: 11:47 AM on 04/12/2022 Tissue fragment(s): Multiple Size: Aggregate, 2.8 x 2.4 x 0.4 cm Description: Received in a white mesh collection bag are fragments of tan soft tissue and blood clot. Entirely submitted in 1 cassette.  RB 04/12/2022  Final Diagnosis performed by Alcario Drought, MD.   Electronically signed 04/13/2022 12:31:59PM The electronic signature indicates that the named Attending Pathologist has evaluated the specimen Technical component performed at [facility], [address], [place], [place] [address] Lab: [phone] Dir: Jason Hauge Schimke, MD, MMM  Professional component performed at [facility], [facility], [address] [place], [place], [place] [address] Lab: [phone] Dir: Beryle Quant, MD       Assessment & Plan:   Problem List Items Addressed This Visit       Other   Obesity    BMI 42.04 and has lost 7 pounds thus far on Ozempic which is covered by her insurance.  Will continue this and increase to 1 MG dosing to start in 4 weeks once completed 0.5 MG dosing.  She is tolerating well and losing weight.  Recommended eating smaller high protein, low fat meals more frequently and exercising 30 mins a day 5 times a week with a goal of 10-15lb weight loss in the next 3 months. Patient voiced their understanding and motivation to adhere to these recommendations.       Relevant Medications   Semaglutide, 1 MG/DOSE, 4 MG/3ML SOPN     Follow up plan: Return in about 7 weeks (around 09/14/2022) for WEIGHT CHECK.

## 2022-08-03 ENCOUNTER — Ambulatory Visit: Payer: BC Managed Care – PPO | Admitting: Nurse Practitioner

## 2022-08-15 ENCOUNTER — Encounter: Payer: Self-pay | Admitting: Nurse Practitioner

## 2022-08-15 MED ORDER — SEMAGLUTIDE (1 MG/DOSE) 4 MG/3ML ~~LOC~~ SOPN: 1.0000 mg | PEN_INJECTOR | SUBCUTANEOUS | 4 refills | Status: DC

## 2022-08-17 ENCOUNTER — Ambulatory Visit
Admission: RE | Admit: 2022-08-17 | Discharge: 2022-08-17 | Disposition: A | Discharge status: Home / Self Care | Payer: BC Managed Care – PPO | Source: Ambulatory Visit | Attending: Nurse Practitioner | Admitting: Nurse Practitioner

## 2022-08-17 DIAGNOSIS — Z1231 Encounter for screening mammogram for malignant neoplasm of breast: Secondary | ICD-10-CM | POA: Diagnosis not present

## 2022-08-18 NOTE — Progress Notes (Signed)
Contacted via MyChart   Normal mammogram, may repeat in one year:)

## 2022-09-10 NOTE — Patient Instructions (Incomplete)

## 2022-09-14 ENCOUNTER — Ambulatory Visit: Payer: BC Managed Care – PPO | Admitting: Nurse Practitioner

## 2022-09-15 ENCOUNTER — Ambulatory Visit (INDEPENDENT_AMBULATORY_CARE_PROVIDER_SITE_OTHER): Payer: BC Managed Care – PPO | Admitting: Nurse Practitioner

## 2022-09-15 ENCOUNTER — Encounter: Payer: Self-pay | Admitting: Nurse Practitioner

## 2022-09-15 VITALS — BP 124/76 | HR 79 | Temp 98.7°F | Ht 69.0 in | Wt 284.1 lb

## 2022-09-15 DIAGNOSIS — Z6841 Body Mass Index (BMI) 40.0 and over, adult: Secondary | ICD-10-CM | POA: Diagnosis not present

## 2022-09-15 NOTE — Patient Instructions (Signed)

## 2022-09-15 NOTE — Progress Notes (Signed)
BP 124/76   Pulse 79   Temp 98.7 F (37.1 C) (Oral)   Ht 5\' 9"  (1.753 m)   Wt 284 lb 1.6 oz (128.9 kg)   LMP 08/25/2015   SpO2 98%   BMI 41.95 kg/m    Subjective:    Patient ID: [name], female    DOB: Oct 24, 1966, 56 y.o.   MRN: [id number]  HPI: [name] is a 56 y.o. female  Chief Complaint  Patient presents with   Weight Check    Patient is here for seven week follow up on a Weight Check. Patient says she is been doing well. Patient denies having any concerns at today's visit.    OBESITY: Restarted Ozempic 0.25/0.5 MG dosing on 06/15/22, increased to 1 MG on 07/27/22 -- did not start right away (on 3rd dose).  Was her birthday recently.  Has 3 month supply of 1 MG dosing and is tolerating well.  Tried Trulicity, but this made her feel horrible.  Has been trying for months, >6 months or more to lose weight. Working on diet and exercise.  She would like to be around 190 to 200 lbs.  She was on Metformin prior to Ozempic via the weight loss program and did not lose much with this.  Has coverage for Ozempic under her insurance in past, since she tried the Metformin initially.  No family history of thyroid cancer or personal history of pancreatitis. Currently has lost 7 pounds since restarting, she did have a birthday recently.   Relevant past medical, surgical, family and social history reviewed and updated as indicated. Interim medical history since our last visit reviewed. Allergies and medications reviewed and updated.  Review of Systems  Constitutional:  Negative for activity change, appetite change, diaphoresis, fatigue and fever.  Respiratory:  Negative for cough, chest tightness, shortness of breath and wheezing.   Cardiovascular:  Negative for chest pain, palpitations and leg swelling.  Gastrointestinal: Negative.   Endocrine: Negative.   Neurological: Negative.   Psychiatric/Behavioral: Negative.      Per HPI unless specifically indicated above      Objective:    BP 124/76   Pulse 79   Temp 98.7 F (37.1 C) (Oral)   Ht 5\' 9"  (1.753 m)   Wt 284 lb 1.6 oz (128.9 kg)   LMP 08/25/2015   SpO2 98%   BMI 41.95 kg/m   Wt Readings from Last 3 Encounters:  09/15/22 284 lb 1.6 oz (128.9 kg)  07/27/22 284 lb 11.2 oz (129.1 kg)  06/15/22 291 lb 6.4 oz (132.2 kg)    Physical Exam Vitals and nursing note reviewed.  Constitutional:      General: She is awake. She is not in acute distress.    Appearance: She is well-developed and well-groomed. She is obese. She is not ill-appearing or toxic-appearing.  HENT:     Head: Normocephalic.     Right Ear: Hearing normal.     Left Ear: Hearing normal.  Eyes:     General: Lids are normal.        Right eye: No discharge.        Left eye: No discharge.     Conjunctiva/sclera: Conjunctivae normal.     Pupils: Pupils are equal, round, and reactive to light.  Neck:     Thyroid: No thyromegaly.     Vascular: No carotid bruit.  Cardiovascular:     Rate and Rhythm: Normal rate and regular rhythm.     Heart  sounds: Normal heart sounds. No murmur heard.    No gallop.  Pulmonary:     Effort: Pulmonary effort is normal. No accessory muscle usage or respiratory distress.     Breath sounds: Normal breath sounds.  Abdominal:     General: Bowel sounds are normal.     Palpations: Abdomen is soft.  Musculoskeletal:     Cervical back: Normal range of motion and neck supple.     Right lower leg: No edema.     Left lower leg: No edema.  Lymphadenopathy:     Cervical: No cervical adenopathy.  Skin:    General: Skin is warm and dry.  Neurological:     Mental Status: She is alert and oriented to person, place, and time.  Psychiatric:        Attention and Perception: Attention normal.        Mood and Affect: Mood normal.        Speech: Speech normal.        Behavior: Behavior normal. Behavior is cooperative.        Thought Content: Thought content normal.    Results for orders placed or performed  during the hospital encounter of 04/12/22  ABO/Rh  Result Value Ref Range   ABO/RH(D)      O POS Performed at [facility], [address] [place], [place] [address]   Surgical pathology  Result Value Ref Range   SURGICAL PATHOLOGY      SURGICAL PATHOLOGY CASE: [id number] PATIENT: Danley Danker Surgical Pathology Report     Specimen Submitted: A. Cervix, 6:00 B. Cervix, 4:00 C. Cervix, 12:00 D. Endocervical curettings E. Endometrial curettings F. Endometrial polyps  Clinical History: Post menopausal bleeding, thickened endometrium, ASCUS, HPV, pap smear      DIAGNOSIS: A.  CERVIX, 6:00; BIOPSY: - LOW-GRADE SQUAMOUS INTRAEPITHELIAL LESION (CIN-1HPV EFFECT). - SQUAMOCOLUMNAR JUNCTION PRESENT.  B.  CERVIX, 4:00; BIOPSY: - SCANT SQUAMOUS AND ENDOCERVICAL MUCOSA WITH NO DYSPLASIA.  C.  CERVIX, 12:00; BIOPSIES: - LOW-GRADE SQUAMOUS INTRAEPITHELIAL LESION (CIN-1HPV EFFECT). - SQUAMOCOLUMNAR JUNCTION PRESENT.  D.  UTERUS, ENDOCERVIX; CURETTAGE: - ENDOCERVICAL MUCOSA WITH NO DYSPLASIA.  E.  UTERUS, ENDOMETRIUM, CURETTAGE: - BENIGN DISORDERED ENDOMETRIUM WITH SCATTERED TUBAL METAPLASIA AND FOCAL BREAKDOWN. - ADMIXED FRAGMENTS WITH OBSCURING ARTIFACT. - NO EVIDENCE OF ATYPICAL HYPERPL ASIA OR MALIGNANCY.  F.  UTERUS, ENDOMETRIUM, POLYPS; POLYPECTOMY: - MULTIPLE FRAGMENTS OF BENIGN ENDOMETRIAL POLYP. - NO EVIDENCE OF ATYPICAL HYPERPLASIA OR MALIGNANCY.   GROSS DESCRIPTION: A. Labeled: Cervical biopsy at 6:00 Received: Formalin Collection time: 11 AM on 04/12/2022 Placed into formalin time: 11 AM on 04/12/2022 Tissue fragment(s): Multiple Size: Aggregate, 0.9 x 0.5 x 0.2 cm Description: Received is a fragment of tan-pink soft tissue and fragments of colorless gelatinous translucent material. Entirely submitted in 1 cassette.  B. Labeled: Cervical biopsy 4:00 Received: Formalin Collection time: 11:02 AM on 04/12/2022 Placed into formalin time:  11:02 AM on 04/12/2022 Tissue fragment(s): 2 Size: Range from 0.5-0.7 cm Description: Received is a fragment of tan soft tissue and a fragment of colorless gelatinous material. Entirely submitted in 1 cassette.  C. Labeled: Cervical biopsy 12:00 Received: Formalin Collection time: 11:04 AM on 04/12/2022  Placed into formalin time: 11:04 AM on 04/12/2022 Tissue fragment(s): Multiple Size: Aggregate, 1.5 x 0.6 x 0.2 cm Description: Received is a fragment of tan soft tissue and fragments of colorless gelatinous translucent material. Entirely submitted in 1 cassette.  D. Labeled: Endocervical curettings Received: Formalin Collection time: 11:06 AM on 04/12/2022 Placed into  formalin time: 11:06 AM on 04/12/2022 Tissue fragment(s): Multiple Size: Aggregate, 1.2 x 0.3 x 0.2 cm Description: Received on Telfa pad are fragments of red soft tissue and colorless translucent gelatinous material. Entirely submitted in 1 cassette.  E. Labeled: Endometrial curettings Received: Fresh Collection time: 11:20 AM on 04/12/2022 Placed into formalin time: 11:47 AM on 04/12/2022 Tissue fragment(s): Multiple Size: Aggregate, 2.8 x 1.9 x 0.3 cm Description: Received on a Telfa pad are fragments of red-brown soft tissue and blood clot. Entirely submitted in 1 cassette.  F. Labeled: Endometr ial polyps Received: Fresh Collection time: 11:21 AM on 04/12/2022 Placed into formalin time: 11:47 AM on 04/12/2022 Tissue fragment(s): Multiple Size: Aggregate, 2.8 x 2.4 x 0.4 cm Description: Received in a white mesh collection bag are fragments of tan soft tissue and blood clot. Entirely submitted in 1 cassette.  RB 04/12/2022  Final Diagnosis performed by Theodora Blow, MD.   Electronically signed 04/13/2022 12:31:59PM The electronic signature indicates that the named Attending Pathologist has evaluated the specimen Technical component performed at [facility], [address], [place], [place] [address] Lab: [phone]  Dir: Rush Farmer, MD, MMM  Professional component performed at [facility], [facility], [place], [place], [place] [address] Lab: [phone] Dir: Kathi Simpers, MD       Assessment & Plan:   Problem List Items Addressed This Visit       Other   Obesity - Primary    BMI 41.95 and has lost 7 pounds thus far on Ozempic which is covered by her insurance.  Will continue this and continue 1 MG dosing as has a 3 month supply.  She is tolerating well and losing weight.  Recommended eating smaller high protein, low fat meals more frequently and exercising 30 mins a day 5 times a week with a goal of 10-15lb weight loss in the next 3 months. Patient voiced their understanding and motivation to adhere to these recommendations.  Plan to increase to 2 MG dosing once completed 1 MG dosing.         Follow up plan: Return in about 3 months (around 12/16/2022) for WEIGHT CHECK.

## 2022-09-15 NOTE — Assessment & Plan Note (Signed)
BMI 41.95 and has lost 7 pounds thus far on Ozempic which is covered by her insurance.  Will continue this and continue 1 MG dosing as has a 3 month supply.  She is tolerating well and losing weight.  Recommended eating smaller high protein, low fat meals more frequently and exercising 30 mins a day 5 times a week with a goal of 10-15lb weight loss in the next 3 months. Patient voiced their understanding and motivation to adhere to these recommendations.  Plan to increase to 2 MG dosing once completed 1 MG dosing.

## 2022-10-02 ENCOUNTER — Encounter: Payer: Self-pay | Admitting: Nurse Practitioner

## 2022-10-02 MED ORDER — SEMAGLUTIDE (2 MG/DOSE) 8 MG/3ML ~~LOC~~ SOPN: 2.0000 mg | PEN_INJECTOR | SUBCUTANEOUS | 12 refills | Status: DC

## 2022-10-05 DIAGNOSIS — M25571 Pain in right ankle and joints of right foot: Secondary | ICD-10-CM | POA: Diagnosis not present

## 2022-10-08 ENCOUNTER — Encounter: Payer: Self-pay | Admitting: Nurse Practitioner

## 2022-10-11 DIAGNOSIS — M25571 Pain in right ankle and joints of right foot: Secondary | ICD-10-CM | POA: Diagnosis not present

## 2022-12-18 ENCOUNTER — Ambulatory Visit: Payer: BC Managed Care – PPO | Admitting: Nurse Practitioner

## 2022-12-25 ENCOUNTER — Telehealth: Payer: BC Managed Care – PPO | Admitting: Nurse Practitioner

## 2023-01-09 ENCOUNTER — Telehealth: Payer: BC Managed Care – PPO | Admitting: Nurse Practitioner

## 2023-01-09 ENCOUNTER — Ambulatory Visit: Payer: BC Managed Care – PPO | Admitting: Nurse Practitioner

## 2023-01-17 ENCOUNTER — Telehealth: Payer: Self-pay | Admitting: Nurse Practitioner

## 2023-01-17 ENCOUNTER — Telehealth: Payer: BC Managed Care – PPO | Admitting: Nurse Practitioner

## 2023-01-17 ENCOUNTER — Encounter: Payer: Self-pay | Admitting: Nurse Practitioner

## 2023-01-17 VITALS — Wt 269.7 lb

## 2023-01-17 DIAGNOSIS — Z6838 Body mass index (BMI) 38.0-38.9, adult: Secondary | ICD-10-CM

## 2023-01-17 DIAGNOSIS — Z713 Dietary counseling and surveillance: Secondary | ICD-10-CM | POA: Diagnosis not present

## 2023-01-17 DIAGNOSIS — E6609 Other obesity due to excess calories: Secondary | ICD-10-CM | POA: Diagnosis not present

## 2023-01-17 NOTE — Patient Instructions (Signed)

## 2023-01-17 NOTE — Progress Notes (Signed)
Wt 269 lb 11.2 oz (122.3 kg)   LMP 08/25/2015   BMI 39.83 kg/m    Subjective:    Patient ID: [name], female    DOB: September 18, 1966, 57 y.o.   MRN: [id number]  HPI: [name] is a 57 y.o. female  Chief Complaint  Patient presents with   Weight Check    Take Ozempic 2.0   This visit was completed via video visit through MyChart due to the restrictions of the COVID-19 pandemic. All issues as above were discussed and addressed. Physical exam was done as above through visual confirmation on video through MyChart. If it was felt that the patient should be evaluated in the office, they were directed there. The patient verbally consented to this visit. Location of the patient: home Location of the provider: work Those involved with this call:  Provider: Marnee Guarneri, DNP CMA: Irena Reichmann, [place] Desk/Registration: [employer]  Time spent on call:  21 minutes with patient face to face via video conference. More than 50% of this time was spent in counseling and coordination of care. 15 minutes total spent in review of patient's record and preparation of their chart.  I verified patient identity using two factors (patient name and date of birth). Patient consents verbally to being seen via telemedicine visit today.    OBESITY: We restarted her Ozempic 0.25/0.5 MG dosing on 06/15/22 (291 lbs), increased to 1 MG on 07/27/22 and currently taking 2 MG weekly dosing -- she reports tolerating this well.  No side effects present with 2 MG.  Currently this is covered by insurance.  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 Has been trying for months, >6 months or more to lose weight. Tried Trulicity, but this made her feel horrible.  She was on Metformin prior to Ozempic via a weight loss program and did not lose much with this. No family history of thyroid cancer or personal history of pancreatitis. She would like to  be around 190 to 200 lbs. Currently weight is trending down with 12 pounds loss.  She is interested in changing to Zepbound in future if stalls out on Ozempic.  Relevant past medical, surgical, family and social history reviewed and updated as indicated. Interim medical history since our last visit reviewed. Allergies and medications reviewed and updated.  Review of Systems  Constitutional:  Negative for activity change, appetite change, diaphoresis, fatigue and fever.  Respiratory:  Negative for cough, chest tightness, shortness of breath and wheezing.   Cardiovascular:  Negative for chest pain, palpitations and leg swelling.  Gastrointestinal: Negative.   Endocrine: Negative.   Neurological: Negative.   Psychiatric/Behavioral: Negative.      Per HPI unless specifically indicated above     Objective:    Wt 269 lb 11.2 oz (122.3 kg)   LMP 08/25/2015   BMI 39.83 kg/m   Wt Readings from Last 3 Encounters:  01/17/23 269 lb 11.2 oz (122.3 kg)  09/15/22 284 lb 1.6 oz (128.9 kg)  07/27/22 284 lb 11.2 oz (129.1 kg)    Physical Exam Vitals and nursing note reviewed.  Constitutional:      General: She is awake. She is not in acute distress.    Appearance: She is well-developed and well-groomed. She is obese. She is not ill-appearing or toxic-appearing.  HENT:     Head: Normocephalic.     Right Ear: Hearing normal.  Left Ear: Hearing normal.  Eyes:     General: Lids are normal.        Right eye: No discharge.        Left eye: No discharge.     Conjunctiva/sclera: Conjunctivae normal.  Pulmonary:     Effort: Pulmonary effort is normal. No accessory muscle usage or respiratory distress.  Musculoskeletal:     Cervical back: Normal range of motion.  Neurological:     Mental Status: She is alert and oriented to person, place, and time.  Psychiatric:        Attention and Perception: Attention normal.        Mood and Affect: Mood normal.        Behavior: Behavior normal.  Behavior is cooperative.        Thought Content: Thought content normal.        Judgment: Judgment normal.     Results for orders placed or performed during the hospital encounter of 04/12/22  ABO/Rh  Result Value Ref Range   ABO/RH(D)      O POS Performed at [facility], [address] [place], [place] [address]   Surgical pathology  Result Value Ref Range   SURGICAL PATHOLOGY      SURGICAL PATHOLOGY CASE: [id number] PATIENT: [name] Surgical Pathology Report     Specimen Submitted: A. Cervix, 6:00 B. Cervix, 4:00 C. Cervix, 12:00 D. Endocervical curettings E. Endometrial curettings F. Endometrial polyps  Clinical History: Post menopausal bleeding, thickened endometrium, ASCUS, HPV, pap smear      DIAGNOSIS: A.  CERVIX, 6:00; BIOPSY: - LOW-GRADE SQUAMOUS INTRAEPITHELIAL LESION (CIN-1HPV EFFECT). - SQUAMOCOLUMNAR JUNCTION PRESENT.  B.  CERVIX, 4:00; BIOPSY: - SCANT SQUAMOUS AND ENDOCERVICAL MUCOSA WITH NO DYSPLASIA.  C.  CERVIX, 12:00; BIOPSIES: - LOW-GRADE SQUAMOUS INTRAEPITHELIAL LESION (CIN-1HPV EFFECT). - SQUAMOCOLUMNAR JUNCTION PRESENT.  D.  UTERUS, ENDOCERVIX; CURETTAGE: - ENDOCERVICAL MUCOSA WITH NO DYSPLASIA.  E.  UTERUS, ENDOMETRIUM, CURETTAGE: - BENIGN DISORDERED ENDOMETRIUM WITH SCATTERED TUBAL METAPLASIA AND FOCAL BREAKDOWN. - ADMIXED FRAGMENTS WITH OBSCURING ARTIFACT. - NO EVIDENCE OF ATYPICAL HYPERPL ASIA OR MALIGNANCY.  F.  UTERUS, ENDOMETRIUM, POLYPS; POLYPECTOMY: - MULTIPLE FRAGMENTS OF BENIGN ENDOMETRIAL POLYP. - NO EVIDENCE OF ATYPICAL HYPERPLASIA OR MALIGNANCY.   GROSS DESCRIPTION: A. Labeled: Cervical biopsy at 6:00 Received: Formalin Collection time: 11 AM on 04/12/2022 Placed into formalin time: 11 AM on 04/12/2022 Tissue fragment(s): Multiple Size: Aggregate, 0.9 x 0.5 x 0.2 cm Description: Received is a fragment of tan-pink soft tissue and fragments of colorless gelatinous translucent  material. Entirely submitted in 1 cassette.  B. Labeled: Cervical biopsy 4:00 Received: Formalin Collection time: 11:02 AM on 04/12/2022 Placed into formalin time: 11:02 AM on 04/12/2022 Tissue fragment(s): 2 Size: Range from 0.5-0.7 cm Description: Received is a fragment of tan soft tissue and a fragment of colorless gelatinous material. Entirely submitted in 1 cassette.  C. Labeled: Cervical biopsy 12:00 Received: Formalin Collection time: 11:04 AM on 04/12/2022  Placed into formalin time: 11:04 AM on 04/12/2022 Tissue fragment(s): Multiple Size: Aggregate, 1.5 x 0.6 x 0.2 cm Description: Received is a fragment of tan soft tissue and fragments of colorless gelatinous translucent material. Entirely submitted in 1 cassette.  D. Labeled: Endocervical curettings Received: Formalin Collection time: 11:06 AM on 04/12/2022 Placed into formalin time: 11:06 AM on 04/12/2022 Tissue fragment(s): Multiple Size: Aggregate, 1.2 x 0.3 x 0.2 cm Description: Received on Telfa pad are fragments of red soft tissue and colorless translucent gelatinous material. Entirely submitted in 1 cassette.  E. Labeled: Endometrial curettings Received: Fresh Collection time: 11:20 AM on 04/12/2022 Placed into formalin time: 11:47 AM on 04/12/2022 Tissue fragment(s): Multiple Size: Aggregate, 2.8 x 1.9 x 0.3 cm Description: Received on a Telfa pad are fragments of red-brown soft tissue and blood clot. Entirely submitted in 1 cassette.  F. Labeled: Endometr ial polyps Received: Fresh Collection time: 11:21 AM on 04/12/2022 Placed into formalin time: 11:47 AM on 04/12/2022 Tissue fragment(s): Multiple Size: Aggregate, 2.8 x 2.4 x 0.4 cm Description: Received in a white mesh collection bag are fragments of tan soft tissue and blood clot. Entirely submitted in 1 cassette.  RB 04/12/2022  Final Diagnosis performed by Theodora Blow, MD.   Electronically signed 04/13/2022 12:31:59PM The electronic signature  indicates that the named Attending Pathologist has evaluated the specimen Technical component performed at [facility], [address] [place], [place] [address] Lab: [phone] Dir: Rush Farmer, MD, MMM  Professional component performed at [facility], [facility], [address], [place], [place] [address] Lab: [phone] Dir: Kathi Simpers, MD       Assessment & Plan:   Problem List Items Addressed This Visit       Other   Obesity - Primary    BMI 39.83 and has lost 12 pounds thus far on Ozempic which is covered by her insurance.  Will continue this and continue 2 MG dosing.  She is tolerating well and losing weight.  Recommended eating smaller high protein, low fat meals more frequently and exercising 30 mins a day 5 times a week with a goal of 10-15lb weight loss in the next 3 months. Patient voiced their understanding and motivation to adhere to these recommendations.  Plan to change to Zepbound if stalls out in future on loss.        I discussed the assessment and treatment plan with the patient. The patient was provided an opportunity to ask questions and all were answered. The patient agreed with the plan and demonstrated an understanding of the instructions.   The patient was advised to call back or seek an in-person evaluation if the symptoms worsen or if the condition fails to improve as anticipated.   I provided 21+ minutes of time during this encounter.    Follow up plan: Return in about 3 months (around 04/17/2023) for Annual physical.

## 2023-01-17 NOTE — Telephone Encounter (Signed)
Called pt and scheduled Annual Physical.

## 2023-01-17 NOTE — Assessment & Plan Note (Signed)
BMI 39.83 and has lost 12 pounds thus far on Ozempic which is covered by her insurance.  Will continue this and continue 2 MG dosing.  She is tolerating well and losing weight.  Recommended eating smaller high protein, low fat meals more frequently and exercising 30 mins a day 5 times a week with a goal of 10-15lb weight loss in the next 3 months. Patient voiced their understanding and motivation to adhere to these recommendations.  Plan to change to Zepbound if stalls out in future on loss.

## 2023-02-05 DIAGNOSIS — S92425A Nondisplaced fracture of distal phalanx of left great toe, initial encounter for closed fracture: Secondary | ICD-10-CM | POA: Diagnosis not present

## 2023-02-12 ENCOUNTER — Ambulatory Visit: Payer: BC Managed Care – PPO | Admitting: Dermatology

## 2023-02-12 ENCOUNTER — Encounter: Payer: Self-pay | Admitting: Dermatology

## 2023-02-12 VITALS — BP 124/75 | HR 76

## 2023-02-12 DIAGNOSIS — Z1283 Encounter for screening for malignant neoplasm of skin: Secondary | ICD-10-CM | POA: Diagnosis not present

## 2023-02-12 DIAGNOSIS — D239 Other benign neoplasm of skin, unspecified: Secondary | ICD-10-CM

## 2023-02-12 DIAGNOSIS — L578 Other skin changes due to chronic exposure to nonionizing radiation: Secondary | ICD-10-CM

## 2023-02-12 DIAGNOSIS — L738 Other specified follicular disorders: Secondary | ICD-10-CM

## 2023-02-12 DIAGNOSIS — B078 Other viral warts: Secondary | ICD-10-CM

## 2023-02-12 DIAGNOSIS — L814 Other melanin hyperpigmentation: Secondary | ICD-10-CM

## 2023-02-12 DIAGNOSIS — D2361 Other benign neoplasm of skin of right upper limb, including shoulder: Secondary | ICD-10-CM

## 2023-02-12 DIAGNOSIS — D229 Melanocytic nevi, unspecified: Secondary | ICD-10-CM

## 2023-02-12 DIAGNOSIS — L821 Other seborrheic keratosis: Secondary | ICD-10-CM

## 2023-02-12 DIAGNOSIS — D1801 Hemangioma of skin and subcutaneous tissue: Secondary | ICD-10-CM

## 2023-02-12 NOTE — Progress Notes (Signed)
New Patient Visit   Subjective  [name] is a 57 y.o. female who presents for the following: The patient presents for Total-Body Skin Exam (TBSE) for skin cancer screening and mole check.  The patient has spots, moles and lesions to be evaluated, some may be new or changing and the patient has concerns that these could be cancer.   The following portions of the chart were reviewed this encounter and updated as appropriate: medications, allergies, medical history  Review of Systems:  No other skin or systemic complaints except as noted in HPI or Assessment and Plan.  Objective  Well appearing patient in no apparent distress; mood and affect are within normal limits.  A full examination was performed including scalp, head, eyes, ears, nose, lips, neck, chest, axillae, abdomen, back, buttocks, bilateral upper extremities, bilateral lower extremities, hands, feet, fingers, toes, fingernails, and toenails. All findings within normal limits unless otherwise noted below.   Assessment & Plan   Lentigines - Scattered tan macules - Due to sun exposure - Benign-appearing, observe - Recommend daily broad spectrum sunscreen SPF 30+ to sun-exposed areas, reapply every 2 hours as needed. - Call for any changes  Seborrheic Keratoses - Stuck-on, waxy, tan-brown papules and/or plaques  - Benign-appearing - Discussed benign etiology and prognosis. - Observe - Call for any changes  Melanocytic Nevi - Tan-brown and/or pink-flesh-colored symmetric macules and papules - Benign appearing on exam today - Observation - Call clinic for new or changing moles - Recommend daily use of broad spectrum spf 30+ sunscreen to sun-exposed areas.   Hemangiomas - Red papules - Discussed benign nature - Observe - Call for any changes  Actinic Damage - Chronic condition, secondary to cumulative UV/sun exposure - diffuse scaly erythematous macules with underlying dyspigmentation - Recommend daily  broad spectrum sunscreen SPF 30+ to sun-exposed areas, reapply every 2 hours as needed.  - Staying in the shade or wearing long sleeves, sun glasses (UVA+UVB protection) and wide brim hats (4-inch brim around the entire circumference of the hat) are also recommended for sun protection.  - Call for new or changing lesions.  Sebaceous Hyperplasia Exam 1 mm  Small yellow papule with a central del Location Left nasal tip - Benign - Observe      Other viral warts  Lentigo  Melanocytic nevus, unspecified location  Actinic skin damage  Sebaceous hyperplasia of face  Skin cancer screening  Seborrheic keratosis  Hemangioma of skin  Dermatofibroma  Dermatofibroma Right tricep - Firm pink/brown papulenodule with dimple sign - Benign appearing - Call for any changes   Melanocytic Nevi Exam: 1 mm brown macule Location:right anterior scalp near anterior hair line  - Benign appearing on exam today - Observation - Call clinic for new or changing moles - Recommend daily use of broad spectrum spf 30+ sunscreen to sun-exposed areas.    WART Exam: verrucous papule(s) Location: left palm thenar eminence  Discussed viral / HPV (Human Papilloma Virus) etiology and risk of spread /infectivity to other areas of body as well as to other people.  Multiple treatments and methods may be required to clear warts and it is possible treatment may not be successful.  Treatment risks include discoloration; scarring and there is still potential for wart recurrence.  Treatment Plan: Destruction Procedure Note Destruction method: cryotherapy   Informed consent: discussed and consent obtained   Lesion destroyed using liquid nitrogen: Yes   Cryotherapy cycles:  2 Outcome: patient tolerated procedure well with no complications  Post-procedure details: wound care instructions given   Locations:  left palm thenar eminence # of Lesions Treated: 1  Prior to procedure, discussed risks of blister  formation, small wound, skin dyspigmentation, or rare scar following cryotherapy. Recommend Vaseline ointment to treated areas while healing.   Skin cancer screening performed today.   Return in about 6 months (around 08/15/2023) for wart .and check spot nose - sebaceous hyperplasia  I, Marye Round, CMA, am acting as scribe for Sarina Ser, MD .   Documentation: I have reviewed the above documentation for accuracy and completeness, and I agree with the above.  Sarina Ser, MD

## 2023-02-12 NOTE — Patient Instructions (Addendum)
Cryotherapy Aftercare  Wash gently with soap and water everyday.   Apply Vaseline and Band-Aid daily until healed.     Due to recent changes in healthcare laws, you may see results of your pathology and/or laboratory studies on MyChart before the doctors have had a chance to review them. We understand that in some cases there may be results that are confusing or concerning to you. Please understand that not all results are received at the same time and often the doctors may need to interpret multiple results in order to provide you with the best plan of care or course of treatment. Therefore, we ask that you please give us 2 business days to thoroughly review all your results before contacting the office for clarification. Should we see a critical lab result, you will be contacted sooner.   If You Need Anything After Your Visit  If you have any questions or concerns for your doctor, please call our main line at [phone] and press option 4 to reach your doctor's medical assistant. If no one answers, please leave a voicemail as directed and we will return your call as soon as possible. Messages left after 4 pm will be answered the following business day.   You may also send us a message via MyChart. We typically respond to MyChart messages within 1-2 business days.  For prescription refills, please ask your pharmacy to contact our office. Our fax number is [phone].  If you have an urgent issue when the clinic is closed that cannot wait until the next business day, you can page your doctor at the number below.    Please note that while we do our best to be available for urgent issues outside of office hours, we are not available 24/7.   If you have an urgent issue and are unable to reach us, you may choose to seek medical care at your doctor's office, retail clinic, urgent care center, or emergency room.  If you have a medical emergency, please immediately call 911 or go to the  emergency department.  Pager Numbers  - Dr. Kowalski: [phone]  - Dr. Moye: [phone]  - Dr. Stewart: [phone]  In the event of inclement weather, please call our main line at [phone] for an update on the status of any delays or closures.  Dermatology Medication Tips: Please keep the boxes that topical medications come in in order to help keep track of the instructions about where and how to use these. Pharmacies typically print the medication instructions only on the boxes and not directly on the medication tubes.   If your medication is too expensive, please contact our office at [phone] option 4 or send us a message through MyChart.   We are unable to tell what your co-pay for medications will be in advance as this is different depending on your insurance coverage. However, we may be able to find a substitute medication at lower cost or fill out paperwork to get insurance to cover a needed medication.   If a prior authorization is required to get your medication covered by your insurance company, please allow us 1-2 business days to complete this process.  Drug prices often vary depending on where the prescription is filled and some pharmacies may offer cheaper prices.  The website www.goodrx.com contains coupons for medications through different pharmacies. The prices here do not account for what the cost may be with help from insurance (it may be cheaper with your insurance), but the website can   give you the price if you did not use any insurance.  - You can print the associated coupon and take it with your prescription to the pharmacy.  - You may also stop by our office during regular business hours and pick up a GoodRx coupon card.  - If you need your prescription sent electronically to a different pharmacy, notify our office through [facility] MyChart or by phone at [phone] option 4.     Si Usted Necesita Algo Despus de Su Visita  Tambin puede  enviarnos un mensaje a travs de MyChart. Por lo general respondemos a los mensajes de MyChart en el transcurso de 1 a 2 das hbiles.  Para renovar recetas, por favor pida a su farmacia que se ponga en contacto con nuestra oficina. Nuestro nmero de fax es el [phone].  Si tiene un asunto urgente cuando la clnica est cerrada y que no puede esperar hasta el siguiente da hbil, puede llamar/localizar a su doctor(a) al nmero que aparece a continuacin.   Por favor, tenga en cuenta que aunque hacemos todo lo posible para estar disponibles para asuntos urgentes fuera del horario de oficina, no estamos disponibles las 24 horas del da, los 7 das de la semana.   Si tiene un problema urgente y no puede comunicarse con nosotros, puede optar por buscar atencin mdica  en el consultorio de su doctor(a), en una clnica privada, en un centro de atencin urgente o en una sala de emergencias.  Si tiene una emergencia mdica, por favor llame inmediatamente al 911 o vaya a la sala de emergencias.  Nmeros de bper  - Dr. Kowalski: [phone]  - Dra. Moye: [phone]  - Dra. Stewart: [phone]  En caso de inclemencias del tiempo, por favor llame a nuestra lnea principal al [phone] para una actualizacin sobre el estado de cualquier retraso o cierre.  Consejos para la medicacin en dermatologa: Por favor, guarde las cajas en las que vienen los medicamentos de uso tpico para ayudarle a seguir las instrucciones sobre dnde y cmo usarlos. Las farmacias generalmente imprimen las instrucciones del medicamento slo en las cajas y no directamente en los tubos del medicamento.   Si su medicamento es muy caro, por favor, pngase en contacto con nuestra oficina llamando al [phone] y presione la opcin 4 o envenos un mensaje a travs de MyChart.   No podemos decirle cul ser su copago por los medicamentos por adelantado ya que esto es diferente dependiendo de la cobertura de su seguro.  Sin embargo, es posible que podamos encontrar un medicamento sustituto a menor costo o llenar un formulario para que el seguro cubra el medicamento que se considera necesario.   Si se requiere una autorizacin previa para que su compaa de seguros cubra su medicamento, por favor permtanos de 1 a 2 das hbiles para completar este proceso.  Los precios de los medicamentos varan con frecuencia dependiendo del lugar de dnde se surte la receta y alguna farmacias pueden ofrecer precios ms baratos.  El sitio web www.goodrx.com tiene cupones para medicamentos de diferentes farmacias. Los precios aqu no tienen en cuenta lo que podra costar con la ayuda del seguro (puede ser ms barato con su seguro), pero el sitio web puede darle el precio si no utiliz ningn seguro.  - Puede imprimir el cupn correspondiente y llevarlo con su receta a la farmacia.  - Tambin puede pasar por nuestra oficina durante el horario de atencin regular y recoger una tarjeta de cupones de GoodRx.  -   Si necesita que su receta se enve electrnicamente a una farmacia diferente, informe a nuestra oficina a travs de MyChart de [facility] o por telfono llamando al [phone] y presione la opcin 4.  

## 2023-02-19 DIAGNOSIS — S92425A Nondisplaced fracture of distal phalanx of left great toe, initial encounter for closed fracture: Secondary | ICD-10-CM | POA: Diagnosis not present

## 2023-03-01 ENCOUNTER — Encounter: Payer: Self-pay | Admitting: Nurse Practitioner

## 2023-03-01 MED ORDER — SCOPOLAMINE 1 MG/3DAYS TD PT72: 1.0000 | MEDICATED_PATCH | TRANSDERMAL | 0 refills | Status: DC

## 2023-04-16 NOTE — Patient Instructions (Incomplete)
Mounjaro (diabetes) and Zepbound (weight loss)  Healthy Eating, Adult Healthy eating may help you get and keep a healthy body weight, reduce the risk of chronic disease, and live a long and productive life. It is important to follow a healthy eating pattern. Your nutritional and calorie needs should be met mainly by different nutrient-rich foods. What are tips for following this plan? Reading food labels Read labels and choose the following: Reduced or low sodium products. Juices with 100% fruit juice. Foods with low saturated fats (<3 g per serving) and high polyunsaturated and monounsaturated fats. Foods with whole grains, such as whole wheat, cracked wheat, brown rice, and wild rice. Whole grains that are fortified with folic acid. This is recommended for females who are pregnant or who want to become pregnant. Read labels and do not eat or drink the following: Foods or drinks with added sugars. These include foods that contain brown sugar, corn sweetener, corn syrup, dextrose, fructose, glucose, high-fructose corn syrup, honey, invert sugar, lactose, malt syrup, maltose, molasses, raw sugar, sucrose, trehalose, or turbinado sugar. Limit your intake of added sugars to less than 10% of your total daily calories. Do not eat more than the following amounts of added sugar per day: 6 teaspoons (25 g) for females. 9 teaspoons (38 g) for males. Foods that contain processed or refined starches and grains. Refined grain products, such as white flour, degermed cornmeal, white bread, and white rice. Shopping Choose nutrient-rich snacks, such as vegetables, whole fruits, and nuts. Avoid high-calorie and high-sugar snacks, such as potato chips, fruit snacks, and candy. Use oil-based dressings and spreads on foods instead of solid fats such as butter, margarine, sour cream, or cream cheese. Limit pre-made sauces, mixes, and "instant" products such as flavored rice, instant noodles, and ready-made  pasta. Try more plant-protein sources, such as tofu, tempeh, black beans, edamame, lentils, nuts, and seeds. Explore eating plans such as the Mediterranean diet or vegetarian diet. Try heart-healthy dips made with beans and healthy fats like hummus and guacamole. Vegetables go great with these. Cooking Use oil to saut or stir-fry foods instead of solid fats such as butter, margarine, or lard. Try baking, boiling, grilling, or broiling instead of frying. Remove the fatty part of meats before cooking. Steam vegetables in water or broth. Meal planning  At meals, imagine dividing your plate into fourths: One-half of your plate is fruits and vegetables. One-fourth of your plate is whole grains. One-fourth of your plate is protein, especially lean meats, poultry, eggs, tofu, beans, or nuts. Include low-fat dairy as part of your daily diet. Lifestyle Choose healthy options in all settings, including home, work, school, restaurants, or stores. Prepare your food safely: Wash your hands after handling raw meats. Where you prepare food, keep surfaces clean by regularly washing with hot, soapy water. Keep raw meats separate from ready-to-eat foods, such as fruits and vegetables. Cook seafood, meat, poultry, and eggs to the recommended temperature. Get a food thermometer. Store foods at safe temperatures. In general: Keep cold foods at 44F (4.4C) or below. Keep hot foods at 144F (60C) or above. Keep your freezer at [facility] (-17.8C) or below. Foods are not safe to eat if they have been between the temperatures of 40-144F (4.4-60C) for more than 2 hours. What foods should I eat? Fruits Aim to eat 1-2 cups of fresh, canned (in natural juice), or frozen fruits each day. One cup of fruit equals 1 small apple, 1 large banana, 8 large strawberries, 1 cup (237 g) canned  fruit,  cup (82 g) dried fruit, or 1 cup (240 mL) 100% juice. Vegetables Aim to eat 2-4 cups of fresh and frozen vegetables each  day, including different varieties and colors. One cup of vegetables equals 1 cup (91 g) broccoli or cauliflower florets, 2 medium carrots, 2 cups (150 g) raw, leafy greens, 1 large tomato, 1 large bell pepper, 1 large sweet potato, or 1 medium white potato. Grains Aim to eat 5-10 ounce-equivalents of whole grains each day. Examples of 1 ounce-equivalent of grains include 1 slice of bread, 1 cup (40 g) ready-to-eat cereal, 3 cups (24 g) popcorn, or  cup (93 g) cooked rice. Meats and other proteins Try to eat 5-7 ounce-equivalents of protein each day. Examples of 1 ounce-equivalent of protein include 1 egg,  oz nuts (12 almonds, 24 pistachios, or 7 walnut halves), 1/4 cup (90 g) cooked beans, 6 tablespoons (90 g) hummus or 1 tablespoon (16 g) peanut butter. A cut of meat or fish that is the size of a deck of cards is about 3-4 ounce-equivalents (85 g). Of the protein you eat each week, try to have at least 8 sounce (227 g) of seafood. This is about 2 servings per week. This includes salmon, trout, herring, sardines, and anchovies. Dairy Aim to eat 3 cup-equivalents of fat-free or low-fat dairy each day. Examples of 1 cup-equivalent of dairy include 1 cup (240 mL) milk, 8 ounces (250 g) yogurt, 1 ounces (44 g) natural cheese, or 1 cup (240 mL) fortified soy milk. Fats and oils Aim for about 5 teaspoons (21 g) of fats and oils per day. Choose monounsaturated fats, such as canola and olive oils, mayonnaise made with olive oil or avocado oil, avocados, peanut butter, and most nuts, or polyunsaturated fats, such as sunflower, corn, and soybean oils, walnuts, pine nuts, sesame seeds, sunflower seeds, and flaxseed. Beverages Aim for 6 eight-ounce glasses of water per day. Limit coffee to 3-5 eight-ounce cups per day. Limit caffeinated beverages that have added calories, such as soda and energy drinks. If you drink alcohol: Limit how much you have to: 0-1 drink a day if you are female. 0-2 drinks a day if  you are female. Know how much alcohol is in your drink. In the [place] one drink is one 12 oz bottle of beer (355 mL), one 5 oz glass of wine (148 mL), or one 1 oz glass of hard liquor (44 mL). Seasoning and other foods Try not to add too much salt to your food. Try using herbs and spices instead of salt. Try not to add sugar to food. This information is based on [place] nutrition guidelines. To learn more, visit [employer]. Exact amounts may vary. You may need different amounts. This information is not intended to replace advice given to you by your health care provider. Make sure you discuss any questions you have with your health care provider. Document Revised: 08/07/2022 Document Reviewed: 08/07/2022 Elsevier Patient Education  2024 [employer].

## 2023-04-19 ENCOUNTER — Encounter: Payer: Self-pay | Admitting: Nurse Practitioner

## 2023-04-19 ENCOUNTER — Ambulatory Visit (INDEPENDENT_AMBULATORY_CARE_PROVIDER_SITE_OTHER): Payer: BC Managed Care – PPO | Admitting: Nurse Practitioner

## 2023-04-19 VITALS — BP 136/83 | HR 82 | Temp 98.7°F | Ht 69.02 in | Wt 281.0 lb

## 2023-04-19 DIAGNOSIS — E7800 Pure hypercholesterolemia, unspecified: Secondary | ICD-10-CM

## 2023-04-19 DIAGNOSIS — M0239 Reiter's disease, multiple sites: Secondary | ICD-10-CM

## 2023-04-19 DIAGNOSIS — N951 Menopausal and female climacteric states: Secondary | ICD-10-CM

## 2023-04-19 DIAGNOSIS — Z0000 Encounter for general adult medical examination without abnormal findings: Secondary | ICD-10-CM

## 2023-04-19 DIAGNOSIS — Z6838 Body mass index (BMI) 38.0-38.9, adult: Secondary | ICD-10-CM

## 2023-04-19 DIAGNOSIS — E559 Vitamin D deficiency, unspecified: Secondary | ICD-10-CM

## 2023-04-19 DIAGNOSIS — K429 Umbilical hernia without obstruction or gangrene: Secondary | ICD-10-CM

## 2023-04-19 DIAGNOSIS — M797 Fibromyalgia: Secondary | ICD-10-CM | POA: Diagnosis not present

## 2023-04-19 DIAGNOSIS — E6609 Other obesity due to excess calories: Secondary | ICD-10-CM

## 2023-04-19 DIAGNOSIS — Z1211 Encounter for screening for malignant neoplasm of colon: Secondary | ICD-10-CM

## 2023-04-19 NOTE — Assessment & Plan Note (Signed)
Noted on past labs, continue supplement and adjust as needed.  Check level today. 

## 2023-04-19 NOTE — Assessment & Plan Note (Signed)
Noted on past labs, recheck today.  She is not fasting, may need to check in future when fasting if major elevations.  Continue diet and exercise focus. The 10-year ASCVD risk score (Arnett DK, et al., 2019) is: 2.7%   Values used to calculate the score:     Age: 57 years     Sex: Female     Is Non-Hispanic African American: No     Diabetic: No     Tobacco smoker: No     Systolic Blood Pressure: 136 mmHg     Is BP treated: No     HDL Cholesterol: 55 mg/dL     Total Cholesterol: 237 mg/dL

## 2023-04-19 NOTE — Assessment & Plan Note (Signed)
BMI 41.48.  Will continue Ozempic 2 MG dosing, but change to Zepbound if coverage present for this in future.  She is tolerating well and losing weight.  Recommended eating smaller high protein, low fat meals more frequently and exercising 30 mins a day 5 times a week with a goal of 10-15lb weight loss in the next 3 months. Patient voiced their understanding and motivation to adhere to these recommendations.

## 2023-04-19 NOTE — Progress Notes (Signed)
BP 136/83   Pulse 82   Temp 98.7 F (37.1 C) (Oral)   Ht 5' 9.02" (1.753 m)   Wt 281 lb (127.5 kg)   LMP 08/25/2015   SpO2 98%   BMI 41.48 kg/m    Subjective:    Patient ID: [name], female    DOB: November 25, 1965, 57 y.o.   MRN: [id number]  HPI: [name] is a 57 y.o. female presenting on 04/19/2023 for comprehensive medical examination. Current medical complaints include:none  She currently lives with: husband  Menopausal Symptoms: yes, hot flashes and night sweats  Currently continues with Ozempic for weight management at 2 MG weekly.  Has plateau, did recently go on cruise.  Is going to gym 4-5 days a week,  spends 45 minutes on working out while there.  Is also working on diet changes.  She lost 35 pounds with Ozempic. Trulicity made her feel bad in past.  Has been trying for months, > 6 months or more to lose weight. Working on diet and exercise. She would like to be around 190 to 200 lbs.  No family history of thyroid cancer or personal history of pancreatitis.   REACTIVE ARTHRITIS & FIBROMYALGIA: Followed by rheumatology, last visit 09/01/21 -- takes Indomethacin.  Uses Indomethacin as needed and Naproxen.  Endorses discomfort with larger breasts, this is beginning to cause pain to shoulders and upper back. Wears size H.   Duration:  chronic Pain: yes Symmetric: yes, almost none at this time Quality: dull, aching, and throbbing Frequency: intermittent Context:  fluctuating Decreased function/range of motion: yes Erythema: none Swelling: knees at time Heat or warmth: none Morning stiffness: yes Aggravating factors:  Alleviating factors:  Relief with NSAIDs?: No NSAIDs Taken Treatments attempted: infra red pad, red light therapy, magnesium Involved Joints:     Hands: yes bilateral    Wrists: none    Elbows: none    Shoulders: none    Back: yes     Hips: none    Knees: yes bilateral    Ankles: none    Feet: none     04/19/2023    9:05 AM 01/17/2023     3:38 PM 09/15/2022    4:11 PM 07/27/2022    9:01 AM 06/15/2022   11:10 AM  Depression screen PHQ 2/9  Decreased Interest 0 0 0 0 0  Down, Depressed, Hopeless 0 0 0 0 0  PHQ - 2 Score 0 0 0 0 0  Altered sleeping 0 0 0 0 0  Tired, decreased energy 0 0 0 0 0  Change in appetite 0 0 0 0 1  Feeling bad or failure about yourself  0 0 0 0 0  Trouble concentrating 1 0 0 0 1  Moving slowly or fidgety/restless 0 0 0 0 0  Suicidal thoughts 0 0 0 0 0  PHQ-9 Score 1 0 0 0 2  Difficult doing work/chores Not difficult at all Not difficult at all Not difficult at all Not difficult at all Not difficult at all       04/19/2023    9:05 AM 01/17/2023    3:38 PM 09/15/2022    4:11 PM 07/27/2022    9:02 AM  GAD 7 : Generalized Anxiety Score  Nervous, Anxious, on Edge 0 0 1 0  Control/stop worrying 0 0 1 0  Worry too much - different things 0 0 0 0  Trouble relaxing 0 0 0 0  Restless 0 0 0 0  Easily  annoyed or irritable 0 0 0 0  Afraid - awful might happen 0 0 0 0  Total GAD 7 Score 0 0 2 0  Anxiety Difficulty Not difficult at all Not difficult at all Not difficult at all Not difficult at all        06/15/2022   11:10 AM 07/27/2022    9:01 AM 09/15/2022    4:10 PM 01/17/2023    3:38 PM 04/19/2023    9:04 AM  Fall Risk  Falls in the past year? 1 0 0 0 0  Was there an injury with Fall? 0 0 0 0 0  Fall Risk Category Calculator 1 0 0 0 0  Fall Risk Category (Retired) Low Low Low    (RETIRED) Patient Fall Risk Level Low fall risk Low fall risk Low fall risk    Patient at Risk for Falls Due to History of fall(s) No Fall Risks No Fall Risks No Fall Risks No Fall Risks  Fall risk Follow up Falls evaluation completed Falls evaluation completed Falls evaluation completed Falls evaluation completed Falls evaluation completed    Functional Status Survey: Is the patient deaf or have difficulty hearing?: No Does the patient have difficulty seeing, even when wearing glasses/contacts?: No Does the patient  have difficulty concentrating, remembering, or making decisions?: No Does the patient have difficulty walking or climbing stairs?: No Does the patient have difficulty dressing or bathing?: No Does the patient have difficulty doing errands alone such as visiting a doctor's office or shopping?: No    Past Medical History:  Past Medical History:  Diagnosis Date   Complication of anesthesia    pt gasping for air after surgery   Concussion 2008   Fibromyalgia    Headache    migraine   Hx of migraines    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HCC)    Viral meningitis     Surgical History:  Past Surgical History:  Procedure Laterality Date   COLPOSCOPY N/A 04/12/2022   Procedure: COLPOSCOPY;  Surgeon: Conard Novak, MD;  Location: [facility];  Service: Gynecology;  Laterality: N/A;   DILATATION & CURETTAGE/HYSTEROSCOPY WITH MYOSURE N/A 04/12/2022   Procedure: DILATATION & CURETTAGE/HYSTEROSCOPY WITH MYOSURE, POLYPECTOMY X2;  Surgeon: Conard Novak, MD;  Location: [facility];  Service: Gynecology;  Laterality: N/A;   LAPAROSCOPIC APPENDECTOMY N/A 10/02/2019   Procedure: APPENDECTOMY LAPAROSCOPIC;  Surgeon: Henrene Dodge, MD;  Location: [facility];  Service: General;  Laterality: N/A;    Medications:  Current Outpatient Medications on File Prior to Visit  Medication Sig   indomethacin (INDOCIN) 50 MG capsule Take 1 capsule (50 mg total) by mouth 2 (two) times daily as needed. Take with a meal.   magnesium oxide (MAG-OX) 400 MG tablet Take 400 mg by mouth daily.   Semaglutide, 2 MG/DOSE, 8 MG/3ML SOPN Inject 2 mg as directed once a week.   No current facility-administered medications on file prior to visit.    Allergies:  Allergies  Allergen Reactions   Influenza A (H1n1) Monovalent Vaccine     Fever and aches and flu symptoms and n/v   Vancomycin Rash    Red man reaction    Social History:  Social History   Socioeconomic History   Marital status: Married    Spouse name: [name]   Number of  children: Not on file   Years of education: Not on file   Highest education level: Not on file  Occupational History   Occupation: [employer] - in [employer]  Tobacco  Use   Smoking status: Former    Packs/day: 0.25    Years: 3.00    Additional pack years: 0.00    Total pack years: 0.75    Types: Cigarettes    Quit date: 08/25/2013    Years since quitting: 9.6   Smokeless tobacco: Never   Tobacco comments:    no smoking in 5 days  Vaping Use   Vaping Use: Never used  Substance and Sexual Activity   Alcohol use: Yes    Alcohol/week: 1.0 standard drink of alcohol    Types: 1 Cans of beer per week    Comment: occ   Drug use: No   Sexual activity: Yes  Other Topics Concern   Not on file  Social History Narrative   Not on file   Social Determinants of Health   Financial Resource Strain: Low Risk  (01/05/2022)   Overall Financial Resource Strain (CARDIA)    Difficulty of Paying Living Expenses: Not hard at all  Food Insecurity: No Food Insecurity (01/05/2022)   Hunger Vital Sign    Worried About Running Out of Food in the Last Year: Never true    Ran Out of Food in the Last Year: Never true  Transportation Needs: No Transportation Needs (01/05/2022)   PRAPARE - Administrator, Civil Service (Medical): No    Lack of Transportation (Non-Medical): No  Physical Activity: Sufficiently Active (01/05/2022)   Exercise Vital Sign    Days of Exercise per Week: 5 days    Minutes of Exercise per Session: 30 min  Stress: No Stress Concern Present (01/05/2022)   [employer] of Occupational Health - Occupational Stress Questionnaire    Feeling of Stress : Not at all  Social Connections: Moderately Isolated (01/05/2022)   Social Connection and Isolation Panel [NHANES]    Frequency of Communication with Friends and Family: More than three times a week    Frequency of Social Gatherings with Friends and Family: More than three times a week    Attends Religious  Services: Never    Database administrator or Organizations: No    Attends Banker Meetings: Never    Marital Status: Married  Catering manager Violence: Not At Risk (01/05/2022)   Humiliation, Afraid, Rape, and Kick questionnaire    Fear of Current or Ex-Partner: No    Emotionally Abused: No    Physically Abused: No    Sexually Abused: No   Social History   Tobacco Use  Smoking Status Former   Packs/day: 0.25   Years: 3.00   Additional pack years: 0.00   Total pack years: 0.75   Types: Cigarettes   Quit date: 08/25/2013   Years since quitting: 9.6  Smokeless Tobacco Never  Tobacco Comments   no smoking in 5 days   Social History   Substance and Sexual Activity  Alcohol Use Yes   Alcohol/week: 1.0 standard drink of alcohol   Types: 1 Cans of beer per week   Comment: occ    Family History:  Family History  Problem Relation Age of Onset   Hyperlipidemia Mother    Heart Problems Father    Healthy Sister    Autism spectrum disorder Son    Autism spectrum disorder Son    Autism spectrum disorder Son    Breast cancer Neg Hx     Past medical history, surgical history, medications, allergies, family history and social history reviewed with patient today and changes made to appropriate areas of  the chart.   ROS All other ROS negative except what is listed above and in the HPI.      Objective:    BP 136/83   Pulse 82   Temp 98.7 F (37.1 C) (Oral)   Ht 5' 9.02" (1.753 m)   Wt 281 lb (127.5 kg)   LMP 08/25/2015   SpO2 98%   BMI 41.48 kg/m   Wt Readings from Last 3 Encounters:  04/19/23 281 lb (127.5 kg)  01/17/23 269 lb 11.2 oz (122.3 kg)  09/15/22 284 lb 1.6 oz (128.9 kg)    Physical Exam Vitals and nursing note reviewed. Exam conducted with a chaperone present.  Constitutional:      General: She is awake. She is not in acute distress.    Appearance: She is well-developed and well-groomed. She is obese. She is not ill-appearing or  toxic-appearing.  HENT:     Head: Normocephalic and atraumatic.     Right Ear: Hearing, tympanic membrane, ear canal and external ear normal. No drainage.     Left Ear: Hearing, tympanic membrane, ear canal and external ear normal. No drainage.     Nose: Nose normal.     Right Sinus: No maxillary sinus tenderness or frontal sinus tenderness.     Left Sinus: No maxillary sinus tenderness or frontal sinus tenderness.     Mouth/Throat:     Mouth: Mucous membranes are moist.     Pharynx: Oropharynx is clear. Uvula midline. No pharyngeal swelling, oropharyngeal exudate or posterior oropharyngeal erythema.  Eyes:     General: Lids are normal.        Right eye: No discharge.        Left eye: No discharge.     Extraocular Movements: Extraocular movements intact.     Conjunctiva/sclera: Conjunctivae normal.     Pupils: Pupils are equal, round, and reactive to light.     Visual Fields: Right eye visual fields normal and left eye visual fields normal.  Neck:     Thyroid: No thyromegaly.     Vascular: No carotid bruit.     Trachea: Trachea normal.  Cardiovascular:     Rate and Rhythm: Normal rate and regular rhythm.     Heart sounds: Normal heart sounds. No murmur heard.    No gallop.  Pulmonary:     Effort: Pulmonary effort is normal. No accessory muscle usage or respiratory distress.     Breath sounds: Normal breath sounds.  Chest:  Breasts:    Right: Normal.     Left: Normal.  Abdominal:     General: Bowel sounds are normal.     Palpations: Abdomen is soft. There is no hepatomegaly or splenomegaly.     Tenderness: There is no abdominal tenderness.  Musculoskeletal:        General: Normal range of motion.     Cervical back: Normal range of motion and neck supple.     Right lower leg: No edema.     Left lower leg: No edema.  Lymphadenopathy:     Head:     Right side of head: No submental, submandibular, tonsillar, preauricular or posterior auricular adenopathy.     Left side of  head: No submental, submandibular, tonsillar, preauricular or posterior auricular adenopathy.     Cervical: No cervical adenopathy.     Upper Body:     Right upper body: No supraclavicular, axillary or pectoral adenopathy.     Left upper body: No supraclavicular, axillary or pectoral adenopathy.  Skin:    General: Skin is warm and dry.     Capillary Refill: Capillary refill takes less than 2 seconds.     Findings: No rash.  Neurological:     Mental Status: She is alert and oriented to person, place, and time.     Gait: Gait is intact.     Deep Tendon Reflexes: Reflexes are normal and symmetric.     Reflex Scores:      Brachioradialis reflexes are 2+ on the right side and 2+ on the left side.      Patellar reflexes are 2+ on the right side and 2+ on the left side. Psychiatric:        Attention and Perception: Attention normal.        Mood and Affect: Mood normal.        Speech: Speech normal.        Behavior: Behavior normal. Behavior is cooperative.        Thought Content: Thought content normal.        Judgment: Judgment normal.     Results for orders placed or performed during the hospital encounter of 04/12/22  ABO/Rh  Result Value Ref Range   ABO/RH(D)      O POS Performed at [facility], [address], [place], [place] [address]   Surgical pathology  Result Value Ref Range   SURGICAL PATHOLOGY      SURGICAL PATHOLOGY CASE: [id number] PATIENT: Danley Danker Surgical Pathology Report     Specimen Submitted: A. Cervix, 6:00 B. Cervix, 4:00 C. Cervix, 12:00 D. Endocervical curettings E. Endometrial curettings F. Endometrial polyps  Clinical History: Post menopausal bleeding, thickened endometrium, ASCUS, HPV, pap smear      DIAGNOSIS: A.  CERVIX, 6:00; BIOPSY: - LOW-GRADE SQUAMOUS INTRAEPITHELIAL LESION (CIN-1HPV EFFECT). - SQUAMOCOLUMNAR JUNCTION PRESENT.  B.  CERVIX, 4:00; BIOPSY: - SCANT SQUAMOUS AND ENDOCERVICAL MUCOSA WITH NO  DYSPLASIA.  C.  CERVIX, 12:00; BIOPSIES: - LOW-GRADE SQUAMOUS INTRAEPITHELIAL LESION (CIN-1HPV EFFECT). - SQUAMOCOLUMNAR JUNCTION PRESENT.  D.  UTERUS, ENDOCERVIX; CURETTAGE: - ENDOCERVICAL MUCOSA WITH NO DYSPLASIA.  E.  UTERUS, ENDOMETRIUM, CURETTAGE: - BENIGN DISORDERED ENDOMETRIUM WITH SCATTERED TUBAL METAPLASIA AND FOCAL BREAKDOWN. - ADMIXED FRAGMENTS WITH OBSCURING ARTIFACT. - NO EVIDENCE OF ATYPICAL HYPERPL ASIA OR MALIGNANCY.  F.  UTERUS, ENDOMETRIUM, POLYPS; POLYPECTOMY: - MULTIPLE FRAGMENTS OF BENIGN ENDOMETRIAL POLYP. - NO EVIDENCE OF ATYPICAL HYPERPLASIA OR MALIGNANCY.   GROSS DESCRIPTION: A. Labeled: Cervical biopsy at 6:00 Received: Formalin Collection time: 11 AM on 04/12/2022 Placed into formalin time: 11 AM on 04/12/2022 Tissue fragment(s): Multiple Size: Aggregate, 0.9 x 0.5 x 0.2 cm Description: Received is a fragment of tan-pink soft tissue and fragments of colorless gelatinous translucent material. Entirely submitted in 1 cassette.  B. Labeled: Cervical biopsy 4:00 Received: Formalin Collection time: 11:02 AM on 04/12/2022 Placed into formalin time: 11:02 AM on 04/12/2022 Tissue fragment(s): 2 Size: Range from 0.5-0.7 cm Description: Received is a fragment of tan soft tissue and a fragment of colorless gelatinous material. Entirely submitted in 1 cassette.  C. Labeled: Cervical biopsy 12:00 Received: Formalin Collection time: 11:04 AM on 04/12/2022  Placed into formalin time: 11:04 AM on 04/12/2022 Tissue fragment(s): Multiple Size: Aggregate, 1.5 x 0.6 x 0.2 cm Description: Received is a fragment of tan soft tissue and fragments of colorless gelatinous translucent material. Entirely submitted in 1 cassette.  D. Labeled: Endocervical curettings Received: Formalin Collection time: 11:06 AM on 04/12/2022 Placed into formalin time: 11:06 AM on 04/12/2022 Tissue fragment(s): Multiple  Size: Aggregate, 1.2 x 0.3 x 0.2 cm Description: Received on Telfa  pad are fragments of red soft tissue and colorless translucent gelatinous material. Entirely submitted in 1 cassette.  E. Labeled: Endometrial curettings Received: Fresh Collection time: 11:20 AM on 04/12/2022 Placed into formalin time: 11:47 AM on 04/12/2022 Tissue fragment(s): Multiple Size: Aggregate, 2.8 x 1.9 x 0.3 cm Description: Received on a Telfa pad are fragments of red-brown soft tissue and blood clot. Entirely submitted in 1 cassette.  F. Labeled: Endometr ial polyps Received: Fresh Collection time: 11:21 AM on 04/12/2022 Placed into formalin time: 11:47 AM on 04/12/2022 Tissue fragment(s): Multiple Size: Aggregate, 2.8 x 2.4 x 0.4 cm Description: Received in a white mesh collection bag are fragments of tan soft tissue and blood clot. Entirely submitted in 1 cassette.  RB 04/12/2022  Final Diagnosis performed by Alcario Drought, MD.   Electronically signed 04/13/2022 12:31:59PM The electronic signature indicates that the named Attending Pathologist has evaluated the specimen Technical component performed at [facility], [address], [place], [place] [address] Lab: [id number] Dir: Pankaj Haack Schimke, MD, MMM  Professional component performed at [facility], [facility], [address] [place], [place], [place] [address] Lab: [phone] Dir: Beryle Quant, MD       Assessment & Plan:   Problem List Items Addressed This Visit       Musculoskeletal and Integument   Reactive arthritis of multiple sites [facility]) - Primary    Chronic, ongoing.  Main areas of discomfort are lower back, hands, and knees.  Followed by rheumatology.  Will continue this collaboration, recent notes and labs reviewed.  Continue current medication regimen as prescribed by them.  Labs today.        Other   Elevated low density lipoprotein (LDL) cholesterol level    Noted on past labs, recheck today.  She is not fasting, may need to check in future when fasting if major elevations.  Continue diet  and exercise focus. The 10-year ASCVD risk score (Arnett DK, et al., 2019) is: 2.7%   Values used to calculate the score:     Age: 34 years     Sex: Female     Is Non-Hispanic African American: No     Diabetic: No     Tobacco smoker: No     Systolic Blood Pressure: 136 mmHg     Is BP treated: No     HDL Cholesterol: 55 mg/dL     Total Cholesterol: 237 mg/dL       Relevant Orders   Comprehensive metabolic panel   Lipid Panel w/o Chol/HDL Ratio   Fibromyalgia    Chronic, ongoing.  Followed by rheumatology at this time.  Recent notes and labs reviewed.  Continue current medication regimen as prescribed by them.  Labs today.      Relevant Orders   CBC with Differential/Platelet   TSH   Obesity    BMI 41.48.  Will continue Ozempic 2 MG dosing, but change to Zepbound if coverage present for this in future.  She is tolerating well and losing weight.  Recommended eating smaller high protein, low fat meals more frequently and exercising 30 mins a day 5 times a week with a goal of 10-15lb weight loss in the next 3 months. Patient voiced their understanding and motivation to adhere to these recommendations.         Vitamin D deficiency    Noted on past labs, continue supplement and adjust as needed.  Check level today.  Relevant Orders   VITAMIN D 25 Hydroxy (Vit-D Deficiency, Fractures)   Other Visit Diagnoses     Colon cancer screening       Cologuard ordered.   Relevant Orders   Cologuard   Encounter for annual physical exam       Annual physical today with labs and health maintenance reviewed, discussed with patient.        Follow up plan: Return in about 6 months (around 10/20/2023) for FIBROMYALGIA AND WEIGHT CHECK.   LABORATORY TESTING:  - Pap smear: Up To Date  IMMUNIZATIONS:   - Tdap: Tetanus vaccination status reviewed: wishes to wait until next year - Influenza: Refused - Pneumovax: Not applicable - Prevnar: Not applicable - COVID: Up to date - HPV:  Not applicable - Shingrix vaccine: will get in future  SCREENING: -Mammogram: Ordered today  - Colonoscopy: Cologuard ordered - Bone Density: Not applicable  -Hearing Test: Not applicable  -Spirometry: Not applicable   PATIENT COUNSELING:   Advised to take 1 mg of folate supplement per day if capable of pregnancy.   Sexuality: Discussed sexually transmitted diseases, partner selection, use of condoms, avoidance of unintended pregnancy  and contraceptive alternatives.   Advised to avoid cigarette smoking.  I discussed with the patient that most people either abstain from alcohol or drink within safe limits (<=14/week and <=4 drinks/occasion for males, <=7/weeks and <= 3 drinks/occasion for females) and that the risk for alcohol disorders and other health effects rises proportionally with the number of drinks per week and how often a drinker exceeds daily limits.  Discussed cessation/primary prevention of drug use and availability of treatment for abuse.   Diet: Encouraged to adjust caloric intake to maintain  or achieve ideal body weight, to reduce intake of dietary saturated fat and total fat, to limit sodium intake by avoiding high sodium foods and not adding table salt, and to maintain adequate dietary potassium and calcium preferably from fresh fruits, vegetables, and low-fat dairy products.    Stressed the importance of regular exercise  Injury prevention: Discussed safety belts, safety helmets, smoke detector, smoking near bedding or upholstery.   Dental health: Discussed importance of regular tooth brushing, flossing, and dental visits.    NEXT PREVENTATIVE PHYSICAL DUE IN 1 YEAR. Return in about 6 months (around 10/20/2023) for FIBROMYALGIA AND WEIGHT CHECK.

## 2023-04-19 NOTE — Assessment & Plan Note (Signed)
Chronic, ongoing.  Main areas of discomfort are lower back, hands, and knees.  Followed by rheumatology.  Will continue this collaboration, recent notes and labs reviewed.  Continue current medication regimen as prescribed by them.  Labs today. ?

## 2023-04-19 NOTE — Assessment & Plan Note (Signed)
Chronic, ongoing.  Followed by rheumatology at this time.  Recent notes and labs reviewed.  Continue current medication regimen as prescribed by them.  Labs today. ?

## 2023-04-20 LAB — LIPID PANEL W/O CHOL/HDL RATIO
Cholesterol, Total: 200 mg/dL — ABNORMAL HIGH (ref 100–199)
HDL: 61 mg/dL (ref >39)
LDL Chol Calc (NIH): 112 mg/dL — ABNORMAL HIGH (ref 0–99)
Triglycerides: 157 mg/dL — ABNORMAL HIGH (ref 0–149)
VLDL Cholesterol Cal: 27 mg/dL (ref 5–40)

## 2023-04-20 LAB — COMPREHENSIVE METABOLIC PANEL
ALT: 12 IU/L (ref 0–32)
AST: 17 IU/L (ref 0–40)
Albumin/Globulin Ratio: 1.7 (ref 1.2–2.2)
Albumin: 4.2 g/dL (ref 3.8–4.9)
Alkaline Phosphatase: 80 IU/L (ref 44–121)
BUN/Creatinine Ratio: 28 — ABNORMAL HIGH (ref 9–23)
BUN: 22 mg/dL (ref 6–24)
Bilirubin Total: <0.2 mg/dL (ref 0.0–1.2)
CO2: 24 mmol/L (ref 20–29)
Calcium: 9.7 mg/dL (ref 8.7–10.2)
Chloride: 103 mmol/L (ref 96–106)
Creatinine, Ser: 0.8 mg/dL (ref 0.57–1.00)
Globulin, Total: 2.5 g/dL (ref 1.5–4.5)
Glucose: 84 mg/dL (ref 70–99)
Potassium: 4.5 mmol/L (ref 3.5–5.2)
Sodium: 139 mmol/L (ref 134–144)
Total Protein: 6.7 g/dL (ref 6.0–8.5)
eGFR: 87 mL/min/{1.73_m2} (ref >59)

## 2023-04-20 LAB — CBC WITH DIFFERENTIAL/PLATELET
Basophils Absolute: 0.1 10*3/uL (ref 0.0–0.2)
Basos: 1 %
EOS (ABSOLUTE): 0.4 10*3/uL (ref 0.0–0.4)
Eos: 6 %
Hematocrit: 41.9 % (ref 34.0–46.6)
Hemoglobin: 13.6 g/dL (ref 11.1–15.9)
Immature Grans (Abs): 0 10*3/uL (ref 0.0–0.1)
Immature Granulocytes: 0 %
Lymphocytes Absolute: 2 10*3/uL (ref 0.7–3.1)
Lymphs: 30 %
MCH: 27.3 pg (ref 26.6–33.0)
MCHC: 32.5 g/dL (ref 31.5–35.7)
MCV: 84 fL (ref 79–97)
Monocytes Absolute: 0.5 10*3/uL (ref 0.1–0.9)
Monocytes: 7 %
Neutrophils Absolute: 3.9 10*3/uL (ref 1.4–7.0)
Neutrophils: 56 %
Platelets: 301 10*3/uL (ref 150–450)
RBC: 4.99 x10E6/uL (ref 3.77–5.28)
RDW: 13.3 % (ref 11.7–15.4)
WBC: 6.9 10*3/uL (ref 3.4–10.8)

## 2023-04-20 LAB — VITAMIN D 25 HYDROXY (VIT D DEFICIENCY, FRACTURES): Vit D, 25-Hydroxy: 37.5 ng/mL (ref 30.0–100.0)

## 2023-04-20 LAB — TSH: TSH: 2.39 u[IU]/mL (ref 0.450–4.500)

## 2023-04-20 NOTE — Progress Notes (Signed)
Contacted via MyChart   Good morning Yahira, your labs have returned and overall are nice and stable with exception of cholesterol levels.  Your cholesterol is still high, but continued recommendations to make lifestyle changes. Your LDL is above normal. The LDL is the bad cholesterol. Over time and in combination with inflammation and other factors, this contributes to plaque which in turn may lead to stroke and/or heart attack down the road. Sometimes high LDL is primarily genetic, and people might be eating all the right foods but still have high numbers. Other times, there is room for improvement in one's diet and eating healthier can bring this number down and potentially reduce one's risk of heart attack and/or stroke.   To reduce your LDL, Remember - more fruits and vegetables, more fish, and limit red meat and dairy products. More soy, nuts, beans, barley, lentils, oats and plant sterol ester enriched margarine instead of butter. I also encourage eliminating sugar and processed food. Remember, shop on the outside of the grocery store and visit your [employer]. If you would like to talk with me about dietary changes plus for your cholesterol, please let me know. We should recheck your cholesterol in 12 months.  Any questions? Keep being amazing!!  Thank you for allowing me to participate in your care.  I appreciate you. Kindest regards, Towanda Hornstein

## 2023-06-18 ENCOUNTER — Encounter: Payer: Self-pay | Admitting: Nurse Practitioner

## 2023-06-18 DIAGNOSIS — E6609 Other obesity due to excess calories: Secondary | ICD-10-CM

## 2023-06-19 MED ORDER — TIRZEPATIDE-WEIGHT MANAGEMENT 5 MG/0.5ML ~~LOC~~ SOAJ: 5.0000 mg | SUBCUTANEOUS | 0 refills | Status: DC

## 2023-06-19 MED ORDER — TIRZEPATIDE-WEIGHT MANAGEMENT 7.5 MG/0.5ML ~~LOC~~ SOAJ: 7.5000 mg | SUBCUTANEOUS | 0 refills | Status: DC

## 2023-06-19 MED ORDER — TIRZEPATIDE-WEIGHT MANAGEMENT 2.5 MG/0.5ML ~~LOC~~ SOAJ: 2.5000 mg | SUBCUTANEOUS | 0 refills | Status: DC

## 2023-06-21 ENCOUNTER — Telehealth: Payer: Self-pay

## 2023-06-21 NOTE — Telephone Encounter (Signed)
PA for Zepbound initiated and submitted via Cover My Meds. Key: [facility]

## 2023-06-25 NOTE — Telephone Encounter (Signed)
Called and LVM notifying patient of approval. DPR verified for VM.

## 2023-07-11 NOTE — Addendum Note (Signed)
Addended by: Aura Dials T on: 07/11/2023 05:09 PM   Modules accepted: Orders

## 2023-07-23 NOTE — Patient Instructions (Signed)
Be Involved in Caring For Your Health:  Taking Medications When medications are taken as directed, they can greatly improve your health. But if they are not taken as prescribed, they may not work. In some cases, not taking them correctly can be harmful. To help ensure your treatment remains effective and safe, understand your medications and how to take them. Bring your medications to each visit for review by your provider.  Your lab results, notes, and after visit summary will be available on My Chart. We strongly encourage you to use this feature. If lab results are abnormal the clinic will contact you with the appropriate steps. If the clinic does not contact you assume the results are satisfactory. You can always view your results on My Chart. If you have questions regarding your health or results, please contact the clinic during office hours. You can also ask questions on My Chart.  We at [facility] are grateful that you chose [place] to provide your care. We strive to provide evidence-based and compassionate care and are always looking for feedback. If you get a survey from the clinic please complete this so we can hear your opinions.  Healthy Eating, Adult Healthy eating may help you get and keep a healthy body weight, reduce the risk of chronic disease, and live a long and productive life. It is important to follow a healthy eating pattern. Your nutritional and calorie needs should be met mainly by different nutrient-rich foods. What are tips for following this plan? Reading food labels Read labels and choose the following: Reduced or low sodium products. Juices with 100% fruit juice. Foods with low saturated fats (<3 g per serving) and high polyunsaturated and monounsaturated fats. Foods with whole grains, such as whole wheat, cracked wheat, brown rice, and wild rice. Whole grains that are fortified with folic acid. This is recommended for females who are pregnant or who want to  become pregnant. Read labels and do not eat or drink the following: Foods or drinks with added sugars. These include foods that contain brown sugar, corn sweetener, corn syrup, dextrose, fructose, glucose, high-fructose corn syrup, honey, invert sugar, lactose, malt syrup, maltose, molasses, raw sugar, sucrose, trehalose, or turbinado sugar. Limit your intake of added sugars to less than 10% of your total daily calories. Do not eat more than the following amounts of added sugar per day: 6 teaspoons (25 g) for females. 9 teaspoons (38 g) for males. Foods that contain processed or refined starches and grains. Refined grain products, such as white flour, degermed cornmeal, white bread, and white rice. Shopping Choose nutrient-rich snacks, such as vegetables, whole fruits, and nuts. Avoid high-calorie and high-sugar snacks, such as potato chips, fruit snacks, and candy. Use oil-based dressings and spreads on foods instead of solid fats such as butter, margarine, sour cream, or cream cheese. Limit pre-made sauces, mixes, and "instant" products such as flavored rice, instant noodles, and ready-made pasta. Try more plant-protein sources, such as tofu, tempeh, black beans, edamame, lentils, nuts, and seeds. Explore eating plans such as the Mediterranean diet or vegetarian diet. Try heart-healthy dips made with beans and healthy fats like hummus and guacamole. Vegetables go great with these. Cooking Use oil to saut or stir-fry foods instead of solid fats such as butter, margarine, or lard. Try baking, boiling, grilling, or broiling instead of frying. Remove the fatty part of meats before cooking. Steam vegetables in water or broth. Meal planning  At meals, imagine dividing your plate into fourths: One-half of  your plate is fruits and vegetables. One-fourth of your plate is whole grains. One-fourth of your plate is protein, especially lean meats, poultry, eggs, tofu, beans, or nuts. Include low-fat  dairy as part of your daily diet. Lifestyle Choose healthy options in all settings, including home, work, school, restaurants, or stores. Prepare your food safely: Wash your hands after handling raw meats. Where you prepare food, keep surfaces clean by regularly washing with hot, soapy water. Keep raw meats separate from ready-to-eat foods, such as fruits and vegetables. Cook seafood, meat, poultry, and eggs to the recommended temperature. Get a food thermometer. Store foods at safe temperatures. In general: Keep cold foods at 48F (4.4C) or below. Keep hot foods at 148F (60C) or above. Keep your freezer at [facility] (-17.8C) or below. Foods are not safe to eat if they have been between the temperatures of 40-148F (4.4-60C) for more than 2 hours. What foods should I eat? Fruits Aim to eat 1-2 cups of fresh, canned (in natural juice), or frozen fruits each day. One cup of fruit equals 1 small apple, 1 large banana, 8 large strawberries, 1 cup (237 g) canned fruit,  cup (82 g) dried fruit, or 1 cup (240 mL) 100% juice. Vegetables Aim to eat 2-4 cups of fresh and frozen vegetables each day, including different varieties and colors. One cup of vegetables equals 1 cup (91 g) broccoli or cauliflower florets, 2 medium carrots, 2 cups (150 g) raw, leafy greens, 1 large tomato, 1 large bell pepper, 1 large sweet potato, or 1 medium white potato. Grains Aim to eat 5-10 ounce-equivalents of whole grains each day. Examples of 1 ounce-equivalent of grains include 1 slice of bread, 1 cup (40 g) ready-to-eat cereal, 3 cups (24 g) popcorn, or  cup (93 g) cooked rice. Meats and other proteins Try to eat 5-7 ounce-equivalents of protein each day. Examples of 1 ounce-equivalent of protein include 1 egg,  oz nuts (12 almonds, 24 pistachios, or 7 walnut halves), 1/4 cup (90 g) cooked beans, 6 tablespoons (90 g) hummus or 1 tablespoon (16 g) peanut butter. A cut of meat or fish that is the size of a deck of  cards is about 3-4 ounce-equivalents (85 g). Of the protein you eat each week, try to have at least 8 sounce (227 g) of seafood. This is about 2 servings per week. This includes salmon, trout, herring, sardines, and anchovies. Dairy Aim to eat 3 cup-equivalents of fat-free or low-fat dairy each day. Examples of 1 cup-equivalent of dairy include 1 cup (240 mL) milk, 8 ounces (250 g) yogurt, 1 ounces (44 g) natural cheese, or 1 cup (240 mL) fortified soy milk. Fats and oils Aim for about 5 teaspoons (21 g) of fats and oils per day. Choose monounsaturated fats, such as canola and olive oils, mayonnaise made with olive oil or avocado oil, avocados, peanut butter, and most nuts, or polyunsaturated fats, such as sunflower, corn, and soybean oils, walnuts, pine nuts, sesame seeds, sunflower seeds, and flaxseed. Beverages Aim for 6 eight-ounce glasses of water per day. Limit coffee to 3-5 eight-ounce cups per day. Limit caffeinated beverages that have added calories, such as soda and energy drinks. If you drink alcohol: Limit how much you have to: 0-1 drink a day if you are female. 0-2 drinks a day if you are female. Know how much alcohol is in your drink. In the [place] one drink is one 12 oz bottle of beer (355 mL), one 5 oz glass of wine (  148 mL), or one 1 oz glass of hard liquor (44 mL). Seasoning and other foods Try not to add too much salt to your food. Try using herbs and spices instead of salt. Try not to add sugar to food. This information is based on [place] nutrition guidelines. To learn more, visit DisposableNylon.be. Exact amounts may vary. You may need different amounts. This information is not intended to replace advice given to you by your health care provider. Make sure you discuss any questions you have with your health care provider. Document Revised: 08/07/2022 Document Reviewed: 08/07/2022 Elsevier Patient Education  2024 [employer].

## 2023-07-26 ENCOUNTER — Encounter: Payer: Self-pay | Admitting: Nurse Practitioner

## 2023-07-27 ENCOUNTER — Encounter: Payer: Self-pay | Admitting: Nurse Practitioner

## 2023-07-27 ENCOUNTER — Ambulatory Visit: Payer: BC Managed Care – PPO | Admitting: Nurse Practitioner

## 2023-07-27 VITALS — BP 131/74 | HR 78 | Temp 97.7°F | Ht 69.0 in | Wt 285.0 lb

## 2023-07-27 DIAGNOSIS — Z6841 Body Mass Index (BMI) 40.0 and over, adult: Secondary | ICD-10-CM

## 2023-07-27 DIAGNOSIS — W57XXXA Bitten or stung by nonvenomous insect and other nonvenomous arthropods, initial encounter: Secondary | ICD-10-CM | POA: Diagnosis not present

## 2023-07-27 MED ORDER — TRIAMCINOLONE ACETONIDE 0.1 % EX CREA: 1.0000 | TOPICAL_CREAM | Freq: Two times a day (BID) | CUTANEOUS | 0 refills | Status: DC

## 2023-07-27 NOTE — Assessment & Plan Note (Signed)
To inner left arm around elbow.  Will avoid Prednisone at this time as is trying to lose weight.  Start Triamcinolone cream to area and monitor.  Continue at home treatments.  If worsening alert provider.

## 2023-07-27 NOTE — Progress Notes (Signed)
BP 131/74   Pulse 78   Temp 97.7 F (36.5 C) (Oral)   Ht 5\' 9"  (1.753 m)   Wt 285 lb (129.3 kg)   LMP 08/25/2015   SpO2 98%   BMI 42.09 kg/m    Subjective:    Patient ID: [name], female    DOB: [birth year], 57 y.o.   MRN: [id number]  HPI: [name] is a 57 y.o. female  Chief Complaint  Patient presents with   Weight Check   Insect Bite    Patient states she was bitten by something yesterday around the inside of her L forearm/elbow area. States the area does not itch.    WEIGHT GAIN Currently taking Zepbound 2.5 MG weekly and tolerating this well without ADR - she is going to 5 MG this week.  Prior to this was on Ozempic, but had stopped losing with this. Has not lost weight at this time, but measurements are coming down.  Is noticing change in appetite -- more water and more protein. Duration: chronic Previous attempts at weight loss: yes Complications of obesity: HLD Peak weight: 290 lbs Weight loss goal:  180 to 190 lbs range Weight loss to date: none at present Requesting obesity pharmacotherapy: yes Current weight loss supplements/medications: yes Previous weight loss supplements/meds: yes Calories:  1200 calories == has started adding more protein to diet, trying to get to 1800 calories  INSECT BITE These showed up last night, went to new friends house who has a Husky -- this was Tuesday.  After this noted a row of bites to upper left back.  Then yesterday noticed bites to mid left arm. Duration: days Location: mid left arm History of trauma in area: no Pain: yes Redness: yes Swelling: no Oozing: no Pus: no Fevers: no Nausea/vomiting: no Status: stable Treatments attempted: cortisone cream and ice   Relevant past medical, surgical, family and social history reviewed and updated as indicated. Interim medical history since our last visit reviewed. Allergies and medications reviewed and updated.  Review of Systems  Constitutional:  Negative  for activity change, appetite change, diaphoresis, fatigue and fever.  Respiratory:  Negative for cough, chest tightness, shortness of breath and wheezing.   Cardiovascular:  Negative for chest pain, palpitations and leg swelling.  Gastrointestinal: Negative.   Endocrine: Negative.   Skin:  Positive for rash.  Neurological: Negative.   Psychiatric/Behavioral: Negative.      Per HPI unless specifically indicated above     Objective:    BP 131/74   Pulse 78   Temp 97.7 F (36.5 C) (Oral)   Ht 5\' 9"  (1.753 m)   Wt 285 lb (129.3 kg)   LMP 08/25/2015   SpO2 98%   BMI 42.09 kg/m   Wt Readings from Last 3 Encounters:  07/27/23 285 lb (129.3 kg)  04/19/23 281 lb (127.5 kg)  01/17/23 269 lb 11.2 oz (122.3 kg)    Physical Exam Vitals and nursing note reviewed.  Constitutional:      General: She is awake. She is not in acute distress.    Appearance: She is well-developed and well-groomed. She is obese. She is not ill-appearing or toxic-appearing.  HENT:     Head: Normocephalic.     Right Ear: Hearing normal.     Left Ear: Hearing normal.  Eyes:     General: Lids are normal.        Right eye: No discharge.        Left eye: No  discharge.     Conjunctiva/sclera: Conjunctivae normal.     Pupils: Pupils are equal, round, and reactive to light.  Neck:     Thyroid: No thyromegaly.     Vascular: No carotid bruit.  Cardiovascular:     Rate and Rhythm: Normal rate and regular rhythm.     Heart sounds: Normal heart sounds. No murmur heard.    No gallop.  Pulmonary:     Effort: Pulmonary effort is normal. No accessory muscle usage or respiratory distress.     Breath sounds: Normal breath sounds.  Abdominal:     General: Bowel sounds are normal.     Palpations: Abdomen is soft.  Musculoskeletal:     Cervical back: Normal range of motion and neck supple.     Right lower leg: No edema.     Left lower leg: No edema.  Lymphadenopathy:     Cervical: No cervical adenopathy.  Skin:     General: Skin is warm and dry.     Findings: Rash present.       Neurological:     Mental Status: She is alert and oriented to person, place, and time.  Psychiatric:        Attention and Perception: Attention normal.        Mood and Affect: Mood normal.        Speech: Speech normal.        Behavior: Behavior normal. Behavior is cooperative.        Thought Content: Thought content normal.     Results for orders placed or performed in visit on 04/19/23  CBC with Differential/Platelet  Result Value Ref Range   WBC 6.9 3.4 - 10.8 x10E3/uL   RBC 4.99 3.77 - 5.28 x10E6/uL   Hemoglobin 13.6 11.1 - 15.9 g/dL   Hematocrit 08.6 57.8 - 46.6 %   MCV 84 79 - 97 fL   MCH 27.3 26.6 - 33.0 pg   MCHC 32.5 31.5 - 35.7 g/dL   RDW 46.9 62.9 - 52.8 %   Platelets 301 150 - 450 x10E3/uL   Neutrophils 56 Not Estab. %   Lymphs 30 Not Estab. %   Monocytes 7 Not Estab. %   Eos 6 Not Estab. %   Basos 1 Not Estab. %   Neutrophils Absolute 3.9 1.4 - 7.0 x10E3/uL   Lymphocytes Absolute 2.0 0.7 - 3.1 x10E3/uL   Monocytes Absolute 0.5 0.1 - 0.9 x10E3/uL   EOS (ABSOLUTE) 0.4 0.0 - 0.4 x10E3/uL   Basophils Absolute 0.1 0.0 - 0.2 x10E3/uL   Immature Granulocytes 0 Not Estab. %   Immature Grans (Abs) 0.0 0.0 - 0.1 x10E3/uL  Comprehensive metabolic panel  Result Value Ref Range   Glucose 84 70 - 99 mg/dL   BUN 22 6 - 24 mg/dL   Creatinine, Ser 4.13 0.57 - 1.00 mg/dL   eGFR 87 >24 MW/NUU/7.25   BUN/Creatinine Ratio 28 (H) 9 - 23   Sodium 139 134 - 144 mmol/L   Potassium 4.5 3.5 - 5.2 mmol/L   Chloride 103 96 - 106 mmol/L   CO2 24 20 - 29 mmol/L   Calcium 9.7 8.7 - 10.2 mg/dL   Total Protein 6.7 6.0 - 8.5 g/dL   Albumin 4.2 3.8 - 4.9 g/dL   Globulin, Total 2.5 1.5 - 4.5 g/dL   Albumin/Globulin Ratio 1.7 1.2 - 2.2   Bilirubin Total <0.2 0.0 - 1.2 mg/dL   Alkaline Phosphatase 80 44 - 121 IU/L   AST 17 0 -  40 IU/L   ALT 12 0 - 32 IU/L  Lipid Panel w/o Chol/HDL Ratio  Result Value Ref Range    Cholesterol, Total 200 (H) 100 - 199 mg/dL   Triglycerides 782 (H) 0 - 149 mg/dL   HDL 61 >95 mg/dL   VLDL Cholesterol Cal 27 5 - 40 mg/dL   LDL Chol Calc (NIH) 621 (H) 0 - 99 mg/dL  TSH  Result Value Ref Range   TSH 2.390 0.450 - 4.500 uIU/mL  VITAMIN D 25 Hydroxy (Vit-D Deficiency, Fractures)  Result Value Ref Range   Vit D, 25-Hydroxy 37.5 30.0 - 100.0 ng/mL      Assessment & Plan:   Problem List Items Addressed This Visit       Other   Bug bites    To inner left arm around elbow.  Will avoid Prednisone at this time as is trying to lose weight.  Start Triamcinolone cream to area and monitor.  Continue at home treatments.  If worsening alert provider.      Obesity - Primary    BMI 42.09.  Will continue Zepbound, she is starting the 5 MG this week and will alert provider how tolerates, will go to 7.5 MG in four weeks if needed.  She is tolerating well without ADR.  Recommended eating smaller high protein, low fat meals more frequently and exercising 30 mins a day 5 times a week with a goal of 10-15lb weight loss in the next 3 months. Patient voiced their understanding and motivation to adhere to these recommendations.           Follow up plan: Return in about 3 months (around 10/26/2023) for WEIGHT CHECK.

## 2023-07-27 NOTE — Assessment & Plan Note (Signed)
BMI 42.09.  Will continue Zepbound, she is starting the 5 MG this week and will alert provider how tolerates, will go to 7.5 MG in four weeks if needed.  She is tolerating well without ADR.  Recommended eating smaller high protein, low fat meals more frequently and exercising 30 mins a day 5 times a week with a goal of 10-15lb weight loss in the next 3 months. Patient voiced their understanding and motivation to adhere to these recommendations.

## 2023-07-31 ENCOUNTER — Ambulatory Visit: Payer: BC Managed Care – PPO | Admitting: Nurse Practitioner

## 2023-08-10 ENCOUNTER — Other Ambulatory Visit: Payer: Self-pay | Admitting: Nurse Practitioner

## 2023-08-10 ENCOUNTER — Encounter: Payer: Self-pay | Admitting: Nurse Practitioner

## 2023-08-10 MED ORDER — INDOMETHACIN 50 MG PO CAPS: ORAL_CAPSULE | ORAL | 12 refills | Status: DC

## 2023-08-13 NOTE — Telephone Encounter (Signed)
Refused Indocin 50 mg because this is a duplicate request.

## 2023-08-16 MED ORDER — INDOMETHACIN 50 MG PO CAPS: ORAL_CAPSULE | ORAL | 12 refills | Status: DC

## 2023-08-16 NOTE — Addendum Note (Signed)
Addended by: Aura Dials T on: 08/16/2023 12:18 PM   Modules accepted: Orders

## 2023-08-21 ENCOUNTER — Other Ambulatory Visit: Payer: Self-pay | Admitting: Nurse Practitioner

## 2023-08-21 NOTE — Telephone Encounter (Signed)
Requested medication (s) are due for refill today: yes   Requested medication (s) are on the active medication list: yes   Last refill:  07/18/23 #2 ml 0 refills  Future visit scheduled: yes in 2 months  Notes to clinic:  medication not assigned to a protocol. Do you want to refill dose Rx?     Requested Prescriptions  Pending Prescriptions Disp Refills   ZEPBOUND 5 MG/0.5ML Pen [Pharmacy Med Name: ZEPBOUND 5MG /0.5ML INJ (4 PF PENS)] 2 mL 0    Sig: ADMINISTER 5 MG UNDER THE SKIN 1 TIME A WEEK     Off-Protocol Failed - 08/21/2023  8:46 AM      Failed - Medication not assigned to a protocol, review manually.      Passed - Valid encounter within last 12 months    Recent Outpatient Visits           3 weeks ago Class 3 severe obesity due to excess calories without serious comorbidity with body mass index (BMI) of 40.0 to 44.9 in adult [facility])   [place] [facility] [place], [place] T, NP   4 months ago Reactive arthritis of multiple sites (HCC)   [place] [facility] [place], Jolene T, NP   7 months ago Class 2 obesity due to excess calories without serious comorbidity with body mass index (BMI) of 38.0 to 38.9 in adult   [place] [facility] [place], [place] T, NP   11 months ago Class 3 severe obesity due to excess calories without serious comorbidity with body mass index (BMI) of 40.0 to 44.9 in adult [facility])   [place] [facility] [place], Corrie Dandy T, NP   1 year ago Class 3 severe obesity due to excess calories without serious comorbidity with body mass index (BMI) of 40.0 to 44.9 in adult [facility])   [place] [facility] [place], Dorie Rank, NP       Future Appointments             Tomorrow Deirdre Evener, MD [place] [facility]   In 2 months [place], Dorie Rank, NP [facility]

## 2023-08-22 ENCOUNTER — Ambulatory Visit: Payer: BC Managed Care – PPO | Admitting: Dermatology

## 2023-08-24 ENCOUNTER — Encounter: Payer: BC Managed Care – PPO | Attending: Nurse Practitioner | Admitting: Dietician

## 2023-08-24 ENCOUNTER — Encounter: Payer: Self-pay | Admitting: Dietician

## 2023-08-24 VITALS — Ht 68.0 in | Wt 281.3 lb

## 2023-08-24 DIAGNOSIS — E66813 Obesity, class 3: Secondary | ICD-10-CM

## 2023-08-24 DIAGNOSIS — Z6841 Body Mass Index (BMI) 40.0 and over, adult: Secondary | ICD-10-CM | POA: Diagnosis not present

## 2023-08-24 NOTE — Patient Instructions (Addendum)
Max HR = 170 bpm Moderate intensity HR target range = 110 - 143 BPM Aim to keep your heart rate within your target range for >15-20 minutes to maximize fat utilization during your workouts.  Go to your gym Thursday, Saturday, and Sunday in the morning.  Set out your workout clothes the night before. Focus on elevating your heart rate quickly during a warm up period.  Moderate fats as you can: Choose unsweetened almond milk in place of the coconut milk. When having eggs, only have 1 yolk, have only egg whites as the remaining ones. When having avocados, have 1/3 as a serving.

## 2023-08-24 NOTE — Progress Notes (Signed)
Medical Nutrition Therapy  Appointment Start time:  0900  Appointment End time:  1020  Primary concerns today: Weight Loss  Referral diagnosis: E66.09 - Obesity Preferred learning style: No preference indicated Learning readiness: Ready   NUTRITION ASSESSMENT    Clinical Medical Hx: Fibromyalgia, Obesity Medications: Zepbound, Indomethicin Labs: TC - 200, TGL - 157, LDL - 112, BUN - 28 Notable Signs/Symptoms: N/A   Lifestyle & Dietary Hx Pt reports taking Zepbound for the last 4 weeks, initially lost 8 lbs, and has since hit a plateau. Pt states they are eating less, getting moderate nausea on day 2-3, having less "food noise". Pt reports history of taking Ozempic for ~5 months 2 years ago, last 35 lbs, gained back 10. Pt reports history of trying various diets to lose weight. Pt reports currently  following a 1800 kcal, 120 g PRO, 30 g fiber recommend by virtual health coach. Pt reports tracking food choices/macros using [employer] app. 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 Pt reports fibromyalgia interferes with activity, pain/energy level is improving with medication, red light therapy. Pt reports needing structure to be successful, states they need definitive guidelines to meet.   Body Composition Scale Date 08/24/2023  Current Body Weight  281.9 lbs  Total Body Fat %  51.6   Total Body Water lbs./%  99.7 lbs/  35%  Skeletal Muscle-Mass lbs  76.7 lbs  BMI 42.9 kg/m2     Estimated daily fluid intake: 80 oz Supplements: MagOx, Women's MVI Sleep: No issues reported Stress / self-care: Enjoys reading as a hobby. Current average weekly physical activity: ADLs,    24-Hr Dietary Recall First Meal: coffee, overnight oats w/ flax/chia, fruit, 20g protein, 40 oz water Snack: protein smoothie  (whey/pea/nutritional yeast), 1 cup fruits, greens and seeds, coconut milk Second Meal: 1/2 [place] with cottage cheese, 3 eggs, goat cheese, peppers Snack: Coffee protein smoothie Third Meal: Protein pasta w/ pesto, chicken breast, roasted veggies Snack: Oikos Triple Zero Beverages: Coffee, water  Nutrient totals: 1600 kcal, 118g PRO, 179g carbs, 60g fat, 42g fiber   NUTRITION DIAGNOSIS  NB-1.1 Food and nutrition-related knowledge deficit As related to Obesity.  As evidenced by BMI of 42.77 kg/m2, hx yo-yo dieting/weight cycling.   NUTRITION INTERVENTION  Nutrition education (E-1) on the following topics:  Educate patient on the three main macronutrients. Protein, fats, and carbohydrates.  Educated patient on the two components of energy balance: Energy in (calories), and energy out (activity). Explain the role of negative energy balance in weight loss. Discussed options with patient to achieve a negative energy balance and how to best control energy in and energy out to accommodate their lifestyle.   Handouts Provided Include  N/A  Learning Style & Readiness for Change Teaching method utilized: Visual & Auditory  Demonstrated degree of understanding via: Teach Back  Barriers to learning/adherence to lifestyle change: Weight history/dietitng  Goals Established by Pt Max HR = 170 bpm Moderate intensity HR target range = 110 - 143 BPM Aim to keep your heart rate within your target range for >15-20 minutes to maximize fat utilization during your workouts. Go to your gym Thursday, Saturday, and Sunday in the morning.  Set out your workout clothes the night before. Focus on elevating your heart rate quickly during a warm up period. Moderate fats as you can: Choose  unsweetened almond milk in place of the coconut milk. When having eggs, only have 1 yolk, have only egg whites as the remaining ones. When having avocados, have 1/3 as a serving.   MONITORING & EVALUATION Dietary  intake, weekly physical activity, and state of change in 2 months.  Next Steps  Patient is to follow up with RD.

## 2023-09-12 ENCOUNTER — Encounter: Payer: Self-pay | Admitting: Nurse Practitioner

## 2023-09-18 MED ORDER — ZEPBOUND 5 MG/0.5ML ~~LOC~~ SOAJ: 5.0000 mg | SUBCUTANEOUS | 5 refills | Status: DC

## 2023-09-18 MED ORDER — TIRZEPATIDE-WEIGHT MANAGEMENT 7.5 MG/0.5ML ~~LOC~~ SOAJ
7.5000 mg | SUBCUTANEOUS | 3 refills | Status: DC
Start: 2023-09-18 — End: 2023-09-18

## 2023-09-18 NOTE — Addendum Note (Signed)
Addended by: Aura Dials T on: 09/18/2023 12:56 PM   Modules accepted: Orders

## 2023-09-24 DIAGNOSIS — Z1329 Encounter for screening for other suspected endocrine disorder: Secondary | ICD-10-CM | POA: Diagnosis not present

## 2023-09-24 DIAGNOSIS — N951 Menopausal and female climacteric states: Secondary | ICD-10-CM | POA: Diagnosis not present

## 2023-09-28 DIAGNOSIS — M2550 Pain in unspecified joint: Secondary | ICD-10-CM | POA: Diagnosis not present

## 2023-09-28 DIAGNOSIS — N951 Menopausal and female climacteric states: Secondary | ICD-10-CM | POA: Diagnosis not present

## 2023-09-28 DIAGNOSIS — R232 Flushing: Secondary | ICD-10-CM | POA: Diagnosis not present

## 2023-09-28 DIAGNOSIS — Z6841 Body Mass Index (BMI) 40.0 and over, adult: Secondary | ICD-10-CM | POA: Diagnosis not present

## 2023-10-06 DIAGNOSIS — S63650A Sprain of metacarpophalangeal joint of right index finger, initial encounter: Secondary | ICD-10-CM | POA: Diagnosis not present

## 2023-10-11 DIAGNOSIS — M79644 Pain in right finger(s): Secondary | ICD-10-CM | POA: Diagnosis not present

## 2023-10-20 NOTE — Patient Instructions (Signed)
Myofascial Pain Syndrome and Fibromyalgia Myofascial pain syndrome and fibromyalgia are both pain disorders. You may feel this pain mainly in your muscles. Myofascial pain syndrome: Always has tender points in the muscles that will cause pain when pressed (trigger points). The pain may come and go. Usually affects your neck, upper back, and shoulder areas. The pain often moves into your arms and hands. Fibromyalgia: Has muscle pains and tenderness that come and go. Is often associated with tiredness (fatigue) and sleep problems. Has trigger points. Tends to be long-lasting (chronic), but is not life-threatening. Fibromyalgia and myofascial pain syndrome are not the same. However, they often occur together. If you have both conditions, each can make the other worse. Both are common and can cause enough pain and fatigue to make day-to-day activities difficult. Both can be hard to diagnose because their symptoms are common in many other conditions. What are the causes? The exact causes of these conditions are not known. What increases the risk? You are more likely to develop either of these conditions if: You have a family history of the condition. You are female. You have certain triggers, such as: Spine disorders. An injury (trauma) or other physical stressors. Being under a lot of stress. Medical conditions such as osteoarthritis, rheumatoid arthritis, or lupus. What are the signs or symptoms? Fibromyalgia The main symptom of fibromyalgia is widespread pain and tenderness in your muscles. Pain is sometimes described as stabbing, shooting, or burning. You may also have: Tingling or numbness. Sleep problems and fatigue. Problems with attention and concentration (fibro fog). Other symptoms may include: Bowel and bladder problems. Headaches. Vision problems. Sensitivity to odors and noises. Depression or mood changes. Painful menstrual periods (dysmenorrhea). Dry skin or eyes. These  symptoms can vary over time. Myofascial pain syndrome Symptoms of myofascial pain syndrome include: Tight, ropy bands of muscle. Uncomfortable sensations in muscle areas. These may include aching, cramping, burning, numbness, tingling, and weakness. Difficulty moving certain parts of the body freely (poor range of motion). How is this diagnosed? This condition may be diagnosed by your symptoms and medical history. You will also have a physical exam. In general: Fibromyalgia is diagnosed if you have pain, fatigue, and other symptoms for more than 3 months, and symptoms cannot be explained by another condition. Myofascial pain syndrome is diagnosed if you have trigger points in your muscles, and those trigger points are tender and cause pain elsewhere in your body (referred pain). How is this treated? Treatment for these conditions depends on the type that you have. For fibromyalgia, a healthy lifestyle is the most important treatment including aerobic and strength exercises. Different types of medicines are used to help treat pain and include: NSAIDs. Medicines for treating depression. Medicines that help control seizures. Medicines that relax the muscles. Treatment for myofascial pain syndrome includes: Pain medicines, such as NSAIDs. Cooling and stretching of muscles. Massage therapy with myofascial release technique. Trigger point injections. Treating these conditions often requires a team of health care providers. These may include: Your primary care provider. A physical therapist. Complementary health care providers, such as massage therapists or acupuncturists. A psychiatrist for cognitive behavioral therapy. Follow these instructions at home: Medicines Take over-the-counter and prescription medicines only as told by your health care provider. Ask your health care provider if the medicine prescribed to you: Requires you to avoid driving or using machinery. Can cause constipation.  You may need to take these actions to prevent or treat constipation: Drink enough fluid to keep your urine pale   yellow. Take over-the-counter or prescription medicines. Eat foods that are high in fiber, such as beans, whole grains, and fresh fruits and vegetables. Limit foods that are high in fat and processed sugars, such as fried or sweet foods. Lifestyle  Do exercises as told by your health care provider or physical therapist. Practice relaxation techniques to control your stress. You may want to try: Biofeedback. Visual imagery. Hypnosis. Muscle relaxation. Yoga. Meditation. Maintain a healthy lifestyle. This includes eating a healthy diet and getting enough sleep. Do not use any products that contain nicotine or tobacco. These products include cigarettes, chewing tobacco, and vaping devices, such as e-cigarettes. If you need help quitting, ask your health care provider. General instructions Talk to your health care provider about complementary treatments, such as acupuncture or massage. Do not do activities that stress or strain your muscles. This includes repetitive motions and heavy lifting. Keep all follow-up visits. This is important. Where to find support Consider joining a support group with others who are diagnosed with this condition. National Fibromyalgia Association: fmaware.org Where to find more information U.S. Pain Foundation: uspainfoundation.org Contact a health care provider if: You have new symptoms. Your symptoms get worse or your pain is severe. You have side effects from your medicines. You have trouble sleeping. Your condition is causing depression or anxiety. Get help right away if: You have thoughts of hurting yourself or others. Get help right away if you feel like you may hurt yourself or others, or have thoughts about taking your own life. Go to your nearest emergency room or: Call 911. Call the National Suicide Prevention Lifeline at 1-800-273-8255  or 988. This is open 24 hours a day. Text the Crisis Text Line at 741741. This information is not intended to replace advice given to you by your health care provider. Make sure you discuss any questions you have with your health care provider. Document Revised: 08/14/2022 Document Reviewed: 10/07/2021 Elsevier Patient Education  2024 Elsevier Inc.  

## 2023-10-25 ENCOUNTER — Ambulatory Visit: Payer: BC Managed Care – PPO | Admitting: Nurse Practitioner

## 2023-10-25 ENCOUNTER — Encounter: Payer: Self-pay | Admitting: Nurse Practitioner

## 2023-10-25 VITALS — BP 130/73 | HR 72 | Temp 98.0°F | Ht 69.0 in | Wt 272.4 lb

## 2023-10-25 DIAGNOSIS — R21 Rash and other nonspecific skin eruption: Secondary | ICD-10-CM | POA: Diagnosis not present

## 2023-10-25 DIAGNOSIS — E66813 Obesity, class 3: Secondary | ICD-10-CM | POA: Diagnosis not present

## 2023-10-25 DIAGNOSIS — Z6841 Body Mass Index (BMI) 40.0 and over, adult: Secondary | ICD-10-CM

## 2023-10-25 DIAGNOSIS — M79644 Pain in right finger(s): Secondary | ICD-10-CM | POA: Diagnosis not present

## 2023-10-25 MED ORDER — ZEPBOUND 7.5 MG/0.5ML ~~LOC~~ SOAJ: 7.5000 mg | SUBCUTANEOUS | 1 refills | Status: DC

## 2023-10-25 NOTE — Assessment & Plan Note (Addendum)
BMI 40.23, with 9 lbs of loss over past month.  Will continue Zepbound, with increase to 7.5 MG and if tolerated will increase further in 4 weeks -- she will alert provider.  She is tolerating well without ADR.  Recommended eating smaller high protein, low fat meals more frequently and exercising 30 mins a day 5 times a week with a goal of 10-15lb weight loss in the next 3 months. Patient voiced their understanding and motivation to adhere to these recommendations.

## 2023-10-25 NOTE — Progress Notes (Addendum)
BP 130/73   Pulse 72   Temp 98 F (36.7 C) (Oral)   Ht 5\' 9"  (1.753 m)   Wt 272 lb 6.4 oz (123.6 kg)   LMP 08/25/2015   SpO2 98%   BMI 40.23 kg/m    Subjective:    Patient ID: [name], female    DOB: Jun 02, 1966, 57 y.o.   MRN: [id number]  HPI: [name] is a 57 y.o. female  Chief Complaint  Patient presents with   Weight Check   Hand Pain    Patient state she has been having pain in her R index finger. States she went to [facility] on 10/11/23 but her finger is still sore. States the pain gets worse if she bumps it against something   Bumps    Patient states she has been noticing little bumps come up in various places on her body. States they started on her arms and have now moved to her neck and chest.    WEIGHT GAIN Currently taking Zepbound 5 MG weekly and tolerating, is ready to increase.  Prior to this was on Ozempic, but had stopped losing with this.  Continues to notice change in appetite -- more water and more protein.  Is seeing a dietician online who was helpful.   Duration: chronic Previous attempts at weight loss: yes Complications of obesity: HLD Peak weight: 290 lbs Weight loss goal:  180 to 190 lbs range Weight loss to date: 9 lbs this past month Requesting obesity pharmacotherapy: yes Current weight loss supplements/medications: yes Previous weight loss supplements/meds: yes Calories:  1800 calories  SKIN BUMPS & RIGHT INDEX PAIN Noticed to neck and arms occasional break outs.  Is doing a heavy probiotic system at present for overall health, which she suspects has caused flares. They are not itchy. No other symptoms. At same time has had flare of pain to right index finger, saw [facility] and took steroid which did not help anything. Indocin that she takes is not helping.  Using [employer] which helps inflammation to finger.   Duration:  weeks  Location:  neck and legs  Itching: no Burning: no Redness: yes Oozing: no Scaling:  no Blisters: no Painful: no Fevers: no Change in detergents/soaps/personal care products: no Recent illness: no Recent travel:yes History of same: no Context: stable Alleviating factors: nothing used for rash -- Castor Oil for finger Treatments attempted:Castor oil for finger Shortness of breath: no  Throat/tongue swelling: no Myalgias/arthralgias: no    Relevant past medical, surgical, family and social history reviewed and updated as indicated. Interim medical history since our last visit reviewed. Allergies and medications reviewed and updated.  Review of Systems  Constitutional:  Negative for activity change, appetite change, diaphoresis, fatigue and fever.  Respiratory:  Negative for cough, chest tightness, shortness of breath and wheezing.   Cardiovascular:  Negative for chest pain, palpitations and leg swelling.  Gastrointestinal: Negative.   Endocrine: Negative.   Musculoskeletal:  Positive for arthralgias.  Skin:  Positive for rash.  Neurological: Negative.   Psychiatric/Behavioral: Negative.      Per HPI unless specifically indicated above     Objective:    BP 130/73   Pulse 72   Temp 98 F (36.7 C) (Oral)   Ht 5\' 9"  (1.753 m)   Wt 272 lb 6.4 oz (123.6 kg)   LMP 08/25/2015   SpO2 98%   BMI 40.23 kg/m   Wt Readings from Last 3 Encounters:  10/25/23 272 lb  6.4 oz (123.6 kg)  08/24/23 281 lb 4.8 oz (127.6 kg)  07/27/23 285 lb (129.3 kg)    Physical Exam Vitals and nursing note reviewed.  Constitutional:      General: She is awake. She is not in acute distress.    Appearance: She is well-developed and well-groomed. She is obese. She is not ill-appearing or toxic-appearing.  HENT:     Head: Normocephalic.     Right Ear: Hearing normal.     Left Ear: Hearing normal.  Eyes:     General: Lids are normal.        Right eye: No discharge.        Left eye: No discharge.     Conjunctiva/sclera: Conjunctivae normal.     Pupils: Pupils are equal, round, and  reactive to light.  Neck:     Thyroid: No thyromegaly.     Vascular: No carotid bruit.  Cardiovascular:     Rate and Rhythm: Normal rate and regular rhythm.     Heart sounds: Normal heart sounds. No murmur heard.    No gallop.  Pulmonary:     Effort: Pulmonary effort is normal. No accessory muscle usage or respiratory distress.     Breath sounds: Normal breath sounds.  Abdominal:     General: Bowel sounds are normal.     Palpations: Abdomen is soft.  Musculoskeletal:     Right hand: No swelling. Normal range of motion. Normal strength. Normal pulse.     Left hand: Normal.     Cervical back: Normal range of motion and neck supple.     Right lower leg: No edema.     Left lower leg: No edema.  Lymphadenopathy:     Cervical: No cervical adenopathy.  Skin:    General: Skin is warm and dry.     Findings: Rash present.       Neurological:     Mental Status: She is alert and oriented to person, place, and time.  Psychiatric:        Attention and Perception: Attention normal.        Mood and Affect: Mood normal.        Speech: Speech normal.        Behavior: Behavior normal. Behavior is cooperative.        Thought Content: Thought content normal.     Results for orders placed or performed in visit on 04/19/23  CBC with Differential/Platelet   Collection Time: 04/19/23  9:43 AM  Result Value Ref Range   WBC 6.9 3.4 - 10.8 x10E3/uL   RBC 4.99 3.77 - 5.28 x10E6/uL   Hemoglobin 13.6 11.1 - 15.9 g/dL   Hematocrit 16.1 09.6 - 46.6 %   MCV 84 79 - 97 fL   MCH 27.3 26.6 - 33.0 pg   MCHC 32.5 31.5 - 35.7 g/dL   RDW 04.5 40.9 - 81.1 %   Platelets 301 150 - 450 x10E3/uL   Neutrophils 56 Not Estab. %   Lymphs 30 Not Estab. %   Monocytes 7 Not Estab. %   Eos 6 Not Estab. %   Basos 1 Not Estab. %   Neutrophils Absolute 3.9 1.4 - 7.0 x10E3/uL   Lymphocytes Absolute 2.0 0.7 - 3.1 x10E3/uL   Monocytes Absolute 0.5 0.1 - 0.9 x10E3/uL   EOS (ABSOLUTE) 0.4 0.0 - 0.4 x10E3/uL    Basophils Absolute 0.1 0.0 - 0.2 x10E3/uL   Immature Granulocytes 0 Not Estab. %   Immature Grans (Abs) 0.0  0.0 - 0.1 x10E3/uL  Comprehensive metabolic panel   Collection Time: 04/19/23  9:43 AM  Result Value Ref Range   Glucose 84 70 - 99 mg/dL   BUN 22 6 - 24 mg/dL   Creatinine, Ser 4.09 0.57 - 1.00 mg/dL   eGFR 87 >81 XB/JYN/8.29   BUN/Creatinine Ratio 28 (H) 9 - 23   Sodium 139 134 - 144 mmol/L   Potassium 4.5 3.5 - 5.2 mmol/L   Chloride 103 96 - 106 mmol/L   CO2 24 20 - 29 mmol/L   Calcium 9.7 8.7 - 10.2 mg/dL   Total Protein 6.7 6.0 - 8.5 g/dL   Albumin 4.2 3.8 - 4.9 g/dL   Globulin, Total 2.5 1.5 - 4.5 g/dL   Albumin/Globulin Ratio 1.7 1.2 - 2.2   Bilirubin Total <0.2 0.0 - 1.2 mg/dL   Alkaline Phosphatase 80 44 - 121 IU/L   AST 17 0 - 40 IU/L   ALT 12 0 - 32 IU/L  Lipid Panel w/o Chol/HDL Ratio   Collection Time: 04/19/23  9:43 AM  Result Value Ref Range   Cholesterol, Total 200 (H) 100 - 199 mg/dL   Triglycerides 562 (H) 0 - 149 mg/dL   HDL 61 >13 mg/dL   VLDL Cholesterol Cal 27 5 - 40 mg/dL   LDL Chol Calc (NIH) 086 (H) 0 - 99 mg/dL  TSH   Collection Time: 04/19/23  9:43 AM  Result Value Ref Range   TSH 2.390 0.450 - 4.500 uIU/mL  VITAMIN D 25 Hydroxy (Vit-D Deficiency, Fractures)   Collection Time: 04/19/23  9:43 AM  Result Value Ref Range   Vit D, 25-Hydroxy 37.5 30.0 - 100.0 ng/mL      Assessment & Plan:   Problem List Items Addressed This Visit       Musculoskeletal and Integument   Rash   Stable, 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  No symptoms with this.  Recommend to monitor closely and if ongoing or worsening we will discuss next steps.  She prefers monitoring at present.        Other   Finger pain, right   Acute, right index finger.  Saw [facility], took steroids with no benefit.  They offered steroid injection, she prefers to avoid.  Currently using Castor oil on  finger which is helping.  Will continue this and alert provider if ongoing or worsening symptoms.      Obesity - Primary   BMI 40.23, with 9 lbs of loss over past month.  Will continue Zepbound, with increase to 7.5 MG and if tolerated will increase further in 4 weeks -- she will alert provider.  She is tolerating well without ADR.  Recommended eating smaller high protein, low fat meals more frequently and exercising 30 mins a day 5 times a week with a goal of 10-15lb weight loss in the next 3 months. Patient voiced their understanding and motivation to adhere to these recommendations.         Relevant Medications   tirzepatide (ZEPBOUND) 7.5 MG/0.5ML Pen      Follow up plan: Return in about 3 months (around 01/23/2024) for WEIGHT CHECK.

## 2023-10-25 NOTE — Assessment & Plan Note (Signed)
Acute, right index finger.  Saw [facility], took steroids with no benefit.  They offered steroid injection, she prefers to avoid.  Currently using Castor oil on finger which is helping.  Will continue this and alert provider if ongoing or worsening symptoms.

## 2023-10-25 NOTE — Assessment & Plan Note (Signed)
Stable, 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  No symptoms with this.  Recommend to monitor closely and if ongoing or worsening we will discuss next steps.  She prefers monitoring at present.

## 2023-10-26 ENCOUNTER — Ambulatory Visit: Payer: BC Managed Care – PPO | Admitting: Nurse Practitioner

## 2023-10-29 ENCOUNTER — Telehealth: Payer: Self-pay

## 2023-10-29 NOTE — Telephone Encounter (Signed)
PA for Zepbound initiated and submitted via the [facility] E portal. Auth #: [id number]

## 2023-10-30 ENCOUNTER — Ambulatory Visit: Payer: BC Managed Care – PPO | Admitting: Dietician

## 2023-11-03 ENCOUNTER — Encounter: Payer: Self-pay | Admitting: Nurse Practitioner

## 2023-11-09 MED ORDER — ZEPBOUND 7.5 MG/0.5ML ~~LOC~~ SOAJ: 7.5000 mg | SUBCUTANEOUS | 1 refills | Status: DC

## 2023-11-09 NOTE — Addendum Note (Signed)
Addended by: Aura Dials T on: 11/09/2023 04:29 PM   Modules accepted: Orders

## 2023-11-12 ENCOUNTER — Telehealth: Payer: Self-pay

## 2023-11-12 NOTE — Telephone Encounter (Signed)
Left a message for her. Need to discuss medication prior auth. Please transfer.

## 2023-11-16 ENCOUNTER — Ambulatory Visit: Payer: BC Managed Care – PPO

## 2023-11-19 ENCOUNTER — Ambulatory Visit: Payer: BC Managed Care – PPO

## 2023-11-19 VITALS — Wt 266.2 lb

## 2023-11-19 DIAGNOSIS — Z6839 Body mass index (BMI) 39.0-39.9, adult: Secondary | ICD-10-CM

## 2023-11-19 NOTE — Progress Notes (Addendum)
Patient came into office today for a nurse only weight check. Her most recent weight today is  Last Weight  Most recent update: 11/19/2023  8:23 AM    Weight  120.7 kg (266 lb 3.2 oz)              Today's current Body mass index is 39.31 kg/m.  Her weight before starting medication therapy was 281 (129.3 kg) as measured on 04/19/2023.  Her total weight loss since starting medication has been 14.8 lbs or 5.26%.   Clinical coverage for weight loss GLP's   Medication being dispensed is Zepbound 2 mL/28 days.    [x]  Product being prescribed is FDA approved for the indication, age, weight (if applicable) and not does not exceed dosing limits per the Prescribing Information per the clinical conditions for use.  [x]  Patient's baseline weight measured within the last 45 days as required by provider before dispensing.  []  Patient is on weight management therapy and One of the following:  [x]  The beneficiary is 56 years of age or over and has ONE of the following:  [x]  A BMI greater than or equal to 30 kg/m2  []  A BMI greater than or equal to 27 kg/m2 with at least one weight-related comorbidity/risk factor/complication (i.e. hypertension, type 2 diabetes, obstructive sleep apnea, cardiovascular disease, dyslipidemia)  []  The beneficiary is 19 years of age or older with a BMI greater than or equal to 27 kg/m2 AND has established cardiovascular disease (CVD) defined as having a history of myocardial infarction, stroke, or symptomatic peripheral disease, to be documented on the PA form. AND  [x]  The beneficiary is currently on and will continue lifestyle modification including structured nutrition and physical activity, unless physical activity is not clinically appropriate at the time GLP1 therapy commences AND  [x]  The beneficiary will NOT be using the requested agent in combination with another GLP-1 receptor agonist agent AND  [x]  The beneficiary does NOT have any FDA-labeled  contraindications to the requested agent, including pregnancy, lactation, history of medullary thyroid cancer or multiple endocrine neoplasia type II.

## 2023-11-19 NOTE — Telephone Encounter (Signed)
Prior Authorization Request   Type of Request   [x]   Original (Date submitted: 11/19/2023  []   Re-submit after denial Date submitted:   []   Appeal Date submitted:   []   Second level appeal Date submitted:   Medication Name and Strength: Zepbound 7.5 mg               Rx Insurance Information to which PA Submitted:     Rx [employer] Name: [place] [employer] [place] via [employer] [facility] RX Case # [id number]  Completed PA Details:  DIAGNOSIS USED TO SUBMIT PA: Obesity and BMI 39  DOCUMENTED MEDICATIONS TRIED/FAILED: n/a

## 2023-12-07 ENCOUNTER — Encounter: Payer: Self-pay | Admitting: Nurse Practitioner

## 2023-12-11 ENCOUNTER — Ambulatory Visit (INDEPENDENT_AMBULATORY_CARE_PROVIDER_SITE_OTHER): Payer: BC Managed Care – PPO | Admitting: Internal Medicine

## 2023-12-11 ENCOUNTER — Encounter: Payer: Self-pay | Admitting: Internal Medicine

## 2023-12-11 VITALS — BP 128/80 | HR 81 | Temp 97.8°F | Resp 16 | Ht 69.0 in | Wt 263.0 lb

## 2023-12-11 DIAGNOSIS — M797 Fibromyalgia: Secondary | ICD-10-CM

## 2023-12-11 DIAGNOSIS — G479 Sleep disorder, unspecified: Secondary | ICD-10-CM | POA: Diagnosis not present

## 2023-12-11 DIAGNOSIS — R5383 Other fatigue: Secondary | ICD-10-CM | POA: Diagnosis not present

## 2023-12-11 DIAGNOSIS — Z6838 Body mass index (BMI) 38.0-38.9, adult: Secondary | ICD-10-CM | POA: Diagnosis not present

## 2023-12-11 NOTE — Progress Notes (Unsigned)
[facility] [address] [place], [place] [address]  Internal MEDICINE  Office Visit Note  [name]  [id number]  [id number]  Date of Service: 12/11/2023   Complaints/HPI Pt is here for establishment of PCP. Chief Complaint  Patient presents with   New Patient (Initial Visit)    Weight management     HPI Pt is here for obesity management   Current Medication: Outpatient Encounter Medications as of 12/11/2023  Medication Sig   indomethacin (INDOCIN) 50 MG capsule Take 1 capsule (50 mg total) by mouth 2 (two) times daily as needed. Take with a meal.   magnesium oxide (MAG-OX) 400 MG tablet Take 400 mg by mouth daily.   tirzepatide (ZEPBOUND) 7.5 MG/0.5ML Pen Inject 7.5 mg into the skin once a week.   No facility-administered encounter medications on file as of 12/11/2023.    Surgical History: Past Surgical History:  Procedure Laterality Date   APPENDECTOMY     COLPOSCOPY N/A 04/12/2022   Procedure: COLPOSCOPY;  Surgeon: Conard Novak, MD;  Location: [facility] ORS;  Service: Gynecology;  Laterality: N/A;   DILATATION & CURETTAGE/HYSTEROSCOPY WITH MYOSURE N/A 04/12/2022   Procedure: DILATATION & CURETTAGE/HYSTEROSCOPY WITH MYOSURE, POLYPECTOMY X2;  Surgeon: Conard Novak, MD;  Location: [facility] ORS;  Service: Gynecology;  Laterality: N/A;   LAPAROSCOPIC APPENDECTOMY N/A 10/02/2019   Procedure: APPENDECTOMY LAPAROSCOPIC;  Surgeon: Henrene Dodge, MD;  Location: [facility] ORS;  Service: General;  Laterality: N/A;    Medical History: Past Medical History:  Diagnosis Date   Complication of anesthesia    pt gasping for air after surgery   Concussion 2008   Fibromyalgia    Headache    migraine   Hx of migraines    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HCC)    Viral meningitis     Family History: Family History  Problem Relation Age of Onset   Hyperlipidemia Mother    Heart Problems Father    Healthy Sister    Autism spectrum disorder Son    Autism  spectrum disorder Son    Autism spectrum disorder Son    Breast cancer Neg Hx     Social History   Socioeconomic History   Marital status: Married    Spouse name: [name]   Number of children: Not on file   Years of education: Not on file   Highest education level: Bachelor's degree (e.g., BA, AB, BS)  Occupational History   Occupation: [employer] - in [employer]  Tobacco Use   Smoking status: Former    Current packs/day: 0.00    Average packs/day: 0.3 packs/day for 3.0 years (0.8 ttl pk-yrs)    Types: Cigarettes    Start date: 08/25/2010    Quit date: 08/25/2013    Years since quitting: 10.3   Smokeless tobacco: Never   Tobacco comments:    no smoking in 5 days  Vaping Use   Vaping status: Never Used  Substance and Sexual Activity   Alcohol use: Yes    Alcohol/week: 1.0 standard drink of alcohol    Types: 1 Cans of beer per week    Comment: occ   Drug use: No   Sexual activity: Yes  Other Topics Concern   Not on file  Social History Narrative   Not on file   Social Drivers of Health   Financial Resource Strain: Low Risk  (10/23/2023)   Overall Financial Resource Strain (CARDIA)    Difficulty of Paying Living Expenses: Not hard at all  Food  Insecurity: No Food Insecurity (10/23/2023)   Hunger Vital Sign    Worried About Running Out of Food in the Last Year: Never true    Ran Out of Food in the Last Year: Never true  Transportation Needs: No Transportation Needs (10/23/2023)   PRAPARE - Administrator, Civil Service (Medical): No    Lack of Transportation (Non-Medical): No  Physical Activity: Insufficiently Active (10/23/2023)   Exercise Vital Sign    Days of Exercise per Week: 2 days    Minutes of Exercise per Session: 30 min  Stress: No Stress Concern Present (10/23/2023)   [employer] of Occupational Health - Occupational Stress Questionnaire    Feeling of Stress : Not at all  Social Connections: Moderately Integrated (10/23/2023)   Social  Connection and Isolation Panel [NHANES]    Frequency of Communication with Friends and Family: Once a week    Frequency of Social Gatherings with Friends and Family: Once a week    Attends Religious Services: 1 to 4 times per year    Active Member of [employer] or Organizations: Yes    Attends Banker Meetings: 1 to 4 times per year    Marital Status: Married  Catering manager Violence: Not At Risk (01/05/2022)   Humiliation, Afraid, Rape, and Kick questionnaire    Fear of Current or Ex-Partner: No    Emotionally Abused: No    Physically Abused: No    Sexually Abused: No     Review of Systems  Vital Signs: BP 128/80   Pulse 81   Temp 97.8 F (36.6 C)   Resp 16   Ht 5\' 9"  (1.753 m)   Wt 263 lb (119.3 kg)   LMP 08/25/2015   SpO2 97%   BMI 38.84 kg/m    Physical Exam    Assessment/Plan:   General Counseling: Shiquita verbalizes understanding of the findings of todays visit and agrees with plan of treatment. I have discussed any further diagnostic evaluation that may be needed or ordered today. We also reviewed her medications today. she has been encouraged to call the office with any questions or concerns that should arise related to todays visit.    Counseling:  [place] Controlled Substance Database was reviewed by me.  Orders Placed This Encounter  Procedures   Metabolic Test    No orders of the defined types were placed in this encounter.   Time spent:*** Minutes

## 2023-12-26 DIAGNOSIS — Z6838 Body mass index (BMI) 38.0-38.9, adult: Secondary | ICD-10-CM | POA: Diagnosis not present

## 2023-12-26 DIAGNOSIS — R5383 Other fatigue: Secondary | ICD-10-CM | POA: Diagnosis not present

## 2023-12-27 LAB — FERRITIN: Ferritin: 59 ng/mL (ref 15–150)

## 2023-12-27 LAB — TSH+FREE T4
Free T4: 1.3 ng/dL (ref 0.82–1.77)
TSH: 2.56 u[IU]/mL (ref 0.450–4.500)

## 2023-12-27 LAB — B12 AND FOLATE PANEL
Folate: 7.6 ng/mL (ref >3.0)
Vitamin B-12: 1457 pg/mL — ABNORMAL HIGH (ref 232–1245)

## 2024-01-08 ENCOUNTER — Ambulatory Visit (INDEPENDENT_AMBULATORY_CARE_PROVIDER_SITE_OTHER): Payer: BC Managed Care – PPO | Admitting: Internal Medicine

## 2024-01-08 ENCOUNTER — Encounter: Payer: Self-pay | Admitting: Internal Medicine

## 2024-01-08 VITALS — BP 123/68 | HR 75 | Temp 97.8°F | Resp 16 | Ht 69.0 in | Wt 260.8 lb

## 2024-01-08 DIAGNOSIS — R5383 Other fatigue: Secondary | ICD-10-CM | POA: Diagnosis not present

## 2024-01-08 DIAGNOSIS — G479 Sleep disorder, unspecified: Secondary | ICD-10-CM | POA: Diagnosis not present

## 2024-01-08 DIAGNOSIS — Z6838 Body mass index (BMI) 38.0-38.9, adult: Secondary | ICD-10-CM

## 2024-01-08 NOTE — Progress Notes (Signed)
 [facility] [address] [place], [place] [address]  Internal MEDICINE  Office Visit Note  [name]  [id number]  [id number]  Date of Service: 01/08/2024  Chief Complaint  Patient presents with   Follow-up   Weight Loss   Quality Metric Gaps    Colonoscopy    HPI Pt is seen today for support of obesity journey She is doing very well, lost 3 lbs, not as stressed about goals. Trying to encourage behavior modifications All labs reviewed   In depth sleep history, Does c/o excessive snoring and frequent snoring   EPWORTH SLEEPINESS SCALE:  Scale:  (0)= no chance of dozing; (1)= slight chance of dozing; (2)= moderate chance of dozing; (3)= high chance of dozing  Chance  Situtation    Sitting and reading: 3    Watching TV: 3    Sitting Inactive in public: 2    As a passenger in car: 3      Lying down to rest: 3    Sitting and talking: 0    Sitting quielty after lunch: 2    In a car, stopped in traffic: 0   TOTAL SCORE:   16 out of 24   Current Medication: Outpatient Encounter Medications as of 01/08/2024  Medication Sig   indomethacin (INDOCIN) 50 MG capsule Take 1 capsule (50 mg total) by mouth 2 (two) times daily as needed. Take with a meal.   magnesium oxide (MAG-OX) 400 MG tablet Take 400 mg by mouth daily.   tirzepatide (ZEPBOUND) 7.5 MG/0.5ML Pen Inject 7.5 mg into the skin once a week.   No facility-administered encounter medications on file as of 01/08/2024.    Surgical History: Past Surgical History:  Procedure Laterality Date   APPENDECTOMY     COLPOSCOPY N/A 04/12/2022   Procedure: COLPOSCOPY;  Surgeon: Conard Novak, MD;  Location: [facility] ORS;  Service: Gynecology;  Laterality: N/A;   DILATATION & CURETTAGE/HYSTEROSCOPY WITH MYOSURE N/A 04/12/2022   Procedure: DILATATION & CURETTAGE/HYSTEROSCOPY WITH MYOSURE, POLYPECTOMY X2;  Surgeon: Conard Novak, MD;  Location: [facility] OR[facility];  Service: Gynecology;  Laterality:  N/A;   LAPAROSCOPIC APPENDECTOMY N/A 10/02/2019   Procedure: APPENDECTOMY LAPAROSCOPIC;  Surgeon: Henrene Dodge, MD;  Location: [facility] OR[facility];  Service: General;  Laterality: N/A;    Medical History: Past Medical History:  Diagnosis Date   Complication of anesthesia    pt gasping for air after surgery   Concussion 2008   Fibromyalgia    Headache    migraine   Hx of migraines    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HCC)    Viral meningitis     Family History: Family History  Problem Relation Age of Onset   Hyperlipidemia Mother    Heart Problems Father    Healthy Sister    Autism spectrum disorder Son    Autism spectrum disorder Son    Autism spectrum disorder Son    Breast cancer Neg Hx     Social History   Socioeconomic History   Marital status: Married    Spouse name: [name]   Number of children: Not on file   Years of education: Not on file   Highest education level: Bachelor's degree (e.g., BA, AB, BS)  Occupational History   Occupation: [employer] - in [employer]  Tobacco Use   Smoking status: Former    Current packs/day: 0.00    Average packs/day: 0.3 packs/day for 3.0 years (0.8 ttl pk-yrs)    Types: Cigarettes    Start  date: 08/25/2010    Quit date: 08/25/2013    Years since quitting: 10.3   Smokeless tobacco: Never   Tobacco comments:    no smoking in 5 days  Vaping Use   Vaping status: Never Used  Substance and Sexual Activity   Alcohol use: Yes    Alcohol/week: 1.0 standard drink of alcohol    Types: 1 Cans of beer per week    Comment: occ   Drug use: No   Sexual activity: Yes  Other Topics Concern   Not on file  Social History Narrative   Not on file   Social Drivers of Health   Financial Resource Strain: Low Risk  (10/23/2023)   Overall Financial Resource Strain (CARDIA)    Difficulty of Paying Living Expenses: Not hard at all  Food Insecurity: No Food Insecurity (10/23/2023)   Hunger Vital Sign    Worried About Running Out of Food in the Last Year:  Never true    Ran Out of Food in the Last Year: Never true  Transportation Needs: No Transportation Needs (10/23/2023)   PRAPARE - Administrator, Civil Service (Medical): No    Lack of Transportation (Non-Medical): No  Physical Activity: Insufficiently Active (10/23/2023)   Exercise Vital Sign    Days of Exercise per Week: 2 days    Minutes of Exercise per Session: 30 min  Stress: No Stress Concern Present (10/23/2023)   [employer] of Occupational Health - Occupational Stress Questionnaire    Feeling of Stress : Not at all  Social Connections: Moderately Integrated (10/23/2023)   Social Connection and Isolation Panel [NHANES]    Frequency of Communication with Friends and Family: Once a week    Frequency of Social Gatherings with Friends and Family: Once a week    Attends Religious Services: 1 to 4 times per year    Active Member of [employer] or Organizations: Yes    Attends Banker Meetings: 1 to 4 times per year    Marital Status: Married  Catering manager Violence: Not At Risk (01/05/2022)   Humiliation, Afraid, Rape, and Kick questionnaire    Fear of Current or Ex-Partner: No    Emotionally Abused: No    Physically Abused: No    Sexually Abused: No      Review of Systems  Constitutional:  Negative for chills, fatigue and unexpected weight change.  HENT:  Positive for postnasal drip. Negative for congestion, rhinorrhea, sneezing and sore throat.   Eyes:  Negative for redness.  Respiratory:  Negative for cough, chest tightness and shortness of breath.   Cardiovascular:  Negative for chest pain and palpitations.  Gastrointestinal:  Negative for abdominal pain, constipation, diarrhea, nausea and vomiting.  Genitourinary:  Negative for dysuria and frequency.  Musculoskeletal:  Negative for arthralgias, back pain, joint swelling and neck pain.  Skin:  Negative for rash.  Neurological: Negative.  Negative for tremors and numbness.  Hematological:   Negative for adenopathy. Does not bruise/bleed easily.  Psychiatric/Behavioral:  Positive for sleep disturbance. Negative for behavioral problems (Depression) and suicidal ideas. The patient is not nervous/anxious.     Vital Signs: BP 123/68   Pulse 75   Temp 97.8 F (36.6 C)   Resp 16   Ht 5\' 9"  (1.753 m)   Wt 260 lb 12.8 oz (118.3 kg)   LMP 08/25/2015   SpO2 98%   BMI 38.51 kg/m    Physical Exam Constitutional:      Appearance: Normal appearance.  HENT:     Head: Normocephalic and atraumatic.     Nose: Nose normal.     Mouth/Throat:     Mouth: Mucous membranes are moist.     Pharynx: No posterior oropharyngeal erythema.  Eyes:     Extraocular Movements: Extraocular movements intact.     Pupils: Pupils are equal, round, and reactive to light.  Cardiovascular:     Pulses: Normal pulses.     Heart sounds: Normal heart sounds.  Pulmonary:     Effort: Pulmonary effort is normal.     Breath sounds: Normal breath sounds.  Musculoskeletal:     Cervical back: Normal range of motion.  Neurological:     General: No focal deficit present.     Mental Status: She is alert.  Psychiatric:        Mood and Affect: Mood normal.        Behavior: Behavior normal.        Assessment/Plan: 1. Sleep disturbance (Primary) Patient has sign and symptoms of OSA ( disturbed sleep, excessive sleepinessduring the day, excessive snoring ?? Gasping and abnormal BMI).Epworth sleep scale is 16.  Baseline sleep study is ordered to further look into this. Long term complications of OSA was addressed with the patient.  - PSG SLEEP STUDY; Future  2. Other fatigue BSS is order to further assist in her condition  - PSG SLEEP STUDY; Future  3. BMI 38.0-38.9,adult Encouraged daily walking, mindfulness and trying new home made meals and natural snacks  - PSG SLEEP STUDY; Future   General Counseling: Alima verbalizes understanding of the findings of todays visit and agrees with plan of treatment.  I have discussed any further diagnostic evaluation that may be needed or ordered today. We also reviewed her medications today. she has been encouraged to call the office with any questions or concerns that should arise related to todays visit.    Orders Placed This Encounter  Procedures   PSG SLEEP STUDY    No orders of the defined types were placed in this encounter.   Total time spent:45 Minutes Time spent includes review of chart, medications, test results, and follow up plan with the patient.   [place] Controlled Substance Database was reviewed by me.   Dr Lyndon Code Internal medicine

## 2024-01-09 ENCOUNTER — Telehealth: Payer: Self-pay | Admitting: Internal Medicine

## 2024-01-09 NOTE — Telephone Encounter (Addendum)
 SS emailed to [employer] w/ [facility]

## 2024-01-15 ENCOUNTER — Telehealth: Payer: Self-pay | Admitting: Internal Medicine

## 2024-01-15 NOTE — Telephone Encounter (Signed)
 SS appointment 02/12/24 @ [facility]

## 2024-01-20 NOTE — Patient Instructions (Signed)
 Be Involved in Caring For Your Health:  Taking Medications When medications are taken as directed, they can greatly improve your health. But if they are not taken as prescribed, they may not work. In some cases, not taking them correctly can be harmful. To help ensure your treatment remains effective and safe, understand your medications and how to take them. Bring your medications to each visit for review by your provider.  Your lab results, notes, and after visit summary will be available on My Chart. We strongly encourage you to use this feature. If lab results are abnormal the clinic will contact you with the appropriate steps. If the clinic does not contact you assume the results are satisfactory. You can always view your results on My Chart. If you have questions regarding your health or results, please contact the clinic during office hours. You can also ask questions on My Chart.  We at [facility] are grateful that you chose [place] to provide your care. We strive to provide evidence-based and compassionate care and are always looking for feedback. If you get a survey from the clinic please complete this so we can hear your opinions.  Healthy Eating, Adult Healthy eating may help you get and keep a healthy body weight, reduce the risk of chronic disease, and live a long and productive life. It is important to follow a healthy eating pattern. Your nutritional and calorie needs should be met mainly by different nutrient-rich foods. What are tips for following this plan? Reading food labels Read labels and choose the following: Reduced or low sodium products. Juices with 100% fruit juice. Foods with low saturated fats (<3 g per serving) and high polyunsaturated and monounsaturated fats. Foods with whole grains, such as whole wheat, cracked wheat, brown rice, and wild rice. Whole grains that are fortified with folic acid. This is recommended for females who are pregnant or who want  to become pregnant. Read labels and do not eat or drink the following: Foods or drinks with added sugars. These include foods that contain brown sugar, corn sweetener, corn syrup, dextrose, fructose, glucose, high-fructose corn syrup, honey, invert sugar, lactose, malt syrup, maltose, molasses, raw sugar, sucrose, trehalose, or turbinado sugar. Limit your intake of added sugars to less than 10% of your total daily calories. Do not eat more than the following amounts of added sugar per day: 6 teaspoons (25 g) for females. 9 teaspoons (38 g) for males. Foods that contain processed or refined starches and grains. Refined grain products, such as white flour, degermed cornmeal, white bread, and white rice. Shopping Choose nutrient-rich snacks, such as vegetables, whole fruits, and nuts. Avoid high-calorie and high-sugar snacks, such as potato chips, fruit snacks, and candy. Use oil-based dressings and spreads on foods instead of solid fats such as butter, margarine, sour cream, or cream cheese. Limit pre-made sauces, mixes, and "instant" products such as flavored rice, instant noodles, and ready-made pasta. Try more plant-protein sources, such as tofu, tempeh, black beans, edamame, lentils, nuts, and seeds. Explore eating plans such as the Mediterranean diet or vegetarian diet. Try heart-healthy dips made with beans and healthy fats like hummus and guacamole. Vegetables go great with these. Cooking Use oil to saut or stir-fry foods instead of solid fats such as butter, margarine, or lard. Try baking, boiling, grilling, or broiling instead of frying. Remove the fatty part of meats before cooking. Steam vegetables in water or broth. Meal planning  At meals, imagine dividing your plate into fourths: One-half of  your plate is fruits and vegetables. One-fourth of your plate is whole grains. One-fourth of your plate is protein, especially lean meats, poultry, eggs, tofu, beans, or nuts. Include  low-fat dairy as part of your daily diet. Lifestyle Choose healthy options in all settings, including home, work, school, restaurants, or stores. Prepare your food safely: Wash your hands after handling raw meats. Where you prepare food, keep surfaces clean by regularly washing with hot, soapy water. Keep raw meats separate from ready-to-eat foods, such as fruits and vegetables. Cook seafood, meat, poultry, and eggs to the recommended temperature. Get a food thermometer. Store foods at safe temperatures. In general: Keep cold foods at 76F (4.4C) or below. Keep hot foods at 176F (60C) or above. Keep your freezer at [facility] (-17.8C) or below. Foods are not safe to eat if they have been between the temperatures of 40-176F (4.4-60C) for more than 2 hours. What foods should I eat? Fruits Aim to eat 1-2 cups of fresh, canned (in natural juice), or frozen fruits each day. One cup of fruit equals 1 small apple, 1 large banana, 8 large strawberries, 1 cup (237 g) canned fruit,  cup (82 g) dried fruit, or 1 cup (240 mL) 100% juice. Vegetables Aim to eat 2-4 cups of fresh and frozen vegetables each day, including different varieties and colors. One cup of vegetables equals 1 cup (91 g) broccoli or cauliflower florets, 2 medium carrots, 2 cups (150 g) raw, leafy greens, 1 large tomato, 1 large bell pepper, 1 large sweet potato, or 1 medium white potato. Grains Aim to eat 5-10 ounce-equivalents of whole grains each day. Examples of 1 ounce-equivalent of grains include 1 slice of bread, 1 cup (40 g) ready-to-eat cereal, 3 cups (24 g) popcorn, or  cup (93 g) cooked rice. Meats and other proteins Try to eat 5-7 ounce-equivalents of protein each day. Examples of 1 ounce-equivalent of protein include 1 egg,  oz nuts (12 almonds, 24 pistachios, or 7 walnut halves), 1/4 cup (90 g) cooked beans, 6 tablespoons (90 g) hummus or 1 tablespoon (16 g) peanut butter. A cut of meat or fish that is the size of a deck  of cards is about 3-4 ounce-equivalents (85 g). Of the protein you eat each week, try to have at least 8 sounce (227 g) of seafood. This is about 2 servings per week. This includes salmon, trout, herring, sardines, and anchovies. Dairy Aim to eat 3 cup-equivalents of fat-free or low-fat dairy each day. Examples of 1 cup-equivalent of dairy include 1 cup (240 mL) milk, 8 ounces (250 g) yogurt, 1 ounces (44 g) natural cheese, or 1 cup (240 mL) fortified soy milk. Fats and oils Aim for about 5 teaspoons (21 g) of fats and oils per day. Choose monounsaturated fats, such as canola and olive oils, mayonnaise made with olive oil or avocado oil, avocados, peanut butter, and most nuts, or polyunsaturated fats, such as sunflower, corn, and soybean oils, walnuts, pine nuts, sesame seeds, sunflower seeds, and flaxseed. Beverages Aim for 6 eight-ounce glasses of water per day. Limit coffee to 3-5 eight-ounce cups per day. Limit caffeinated beverages that have added calories, such as soda and energy drinks. If you drink alcohol: Limit how much you have to: 0-1 drink a day if you are female. 0-2 drinks a day if you are female. Know how much alcohol is in your drink. In the [place] one drink is one 12 oz bottle of beer (355 mL), one 5 oz glass of wine (  148 mL), or one 1 oz glass of hard liquor (44 mL). Seasoning and other foods Try not to add too much salt to your food. Try using herbs and spices instead of salt. Try not to add sugar to food. This information is based on [place] nutrition guidelines. To learn more, visit DisposableNylon.be. Exact amounts may vary. You may need different amounts. This information is not intended to replace advice given to you by your health care provider. Make sure you discuss any questions you have with your health care provider. Document Revised: 08/07/2022 Document Reviewed: 08/07/2022 Elsevier Patient Education  2024 [employer].

## 2024-01-23 ENCOUNTER — Encounter: Payer: Self-pay | Admitting: Nurse Practitioner

## 2024-01-23 ENCOUNTER — Ambulatory Visit: Payer: BC Managed Care – PPO | Admitting: Nurse Practitioner

## 2024-01-23 VITALS — BP 114/70 | HR 82 | Temp 98.0°F | Ht 68.8 in | Wt 260.8 lb

## 2024-01-23 DIAGNOSIS — E6609 Other obesity due to excess calories: Secondary | ICD-10-CM

## 2024-01-23 DIAGNOSIS — E66812 Obesity, class 2: Secondary | ICD-10-CM | POA: Diagnosis not present

## 2024-01-23 DIAGNOSIS — Z6838 Body mass index (BMI) 38.0-38.9, adult: Secondary | ICD-10-CM | POA: Diagnosis not present

## 2024-01-23 MED ORDER — ZEPBOUND 10 MG/0.5ML ~~LOC~~ SOAJ: 10.0000 mg | SUBCUTANEOUS | 4 refills | Status: DC

## 2024-01-23 NOTE — Progress Notes (Signed)
 BP 114/70   Pulse 82   Temp 98 F (36.7 C) (Oral)   Ht 5' 8.8" (1.748 m)   Wt 260 lb 12.8 oz (118.3 kg)   LMP 08/25/2015   SpO2 98%   BMI 38.74 kg/m    Subjective:    Patient ID: [name], female    DOB: Jan 11, 1966, 58 y.o.   MRN: [id number]  HPI: [name] is a 58 y.o. female  Chief Complaint  Patient presents with   Weight Check   WEIGHT GAIN Taking Zepbound 7.5 MG weekly and tolerating, is following with Dr. Welton Flakes for weight management.  Going for sleep study at end of month.  Prior to this was on Ozempic, but had stopped losing with this and switched over.  Has no side effects with medication.   Is seeing a dietician online who was helpful.   Duration: chronic Previous attempts at weight loss: yes Complications of obesity: HLD, Obesity Peak weight: 290 lbs Weight loss goal:  180 to 190 lbs range Weight loss to date: 2 lbs this past month = 12 pounds in past 3 months Requesting obesity pharmacotherapy: yes Current weight loss supplements/medications: yes Previous weight loss supplements/meds: yes Calories:  1800 calories  Relevant past medical, surgical, family and social history reviewed and updated as indicated. Interim medical history since our last visit reviewed. Allergies and medications reviewed and updated.  Review of Systems  Constitutional:  Negative for activity change, appetite change, diaphoresis, fatigue and fever.  Respiratory:  Negative for cough, chest tightness, shortness of breath and wheezing.   Cardiovascular:  Negative for chest pain, palpitations and leg swelling.  Gastrointestinal: Negative.   Endocrine: Negative.   Neurological: Negative.   Psychiatric/Behavioral: Negative.     Per HPI unless specifically indicated above     Objective:    BP 114/70   Pulse 82   Temp 98 F (36.7 C) (Oral)   Ht 5' 8.8" (1.748 m)   Wt 260 lb 12.8 oz (118.3 kg)   LMP 08/25/2015   SpO2 98%   BMI 38.74 kg/m   Wt Readings from Last 3  Encounters:  01/23/24 260 lb 12.8 oz (118.3 kg)  01/08/24 260 lb 12.8 oz (118.3 kg)  12/11/23 263 lb (119.3 kg)    Physical Exam Vitals and nursing note reviewed.  Constitutional:      General: She is awake. She is not in acute distress.    Appearance: She is well-developed and well-groomed. She is obese. She is not ill-appearing or toxic-appearing.  HENT:     Head: Normocephalic.     Right Ear: Hearing and external ear normal.     Left Ear: Hearing and external ear normal.  Eyes:     General: Lids are normal.        Right eye: No discharge.        Left eye: No discharge.     Conjunctiva/sclera: Conjunctivae normal.     Pupils: Pupils are equal, round, and reactive to light.  Neck:     Thyroid: No thyromegaly.     Vascular: No carotid bruit.  Cardiovascular:     Rate and Rhythm: Normal rate and regular rhythm.     Heart sounds: Normal heart sounds. No murmur heard.    No gallop.  Pulmonary:     Effort: Pulmonary effort is normal. No accessory muscle usage or respiratory distress.     Breath sounds: Normal breath sounds.  Abdominal:     General: Bowel sounds  are normal. There is no distension.     Palpations: Abdomen is soft.     Tenderness: There is no abdominal tenderness.  Musculoskeletal:     Cervical back: Normal range of motion and neck supple.     Right lower leg: No edema.     Left lower leg: No edema.  Lymphadenopathy:     Cervical: No cervical adenopathy.  Skin:    General: Skin is warm and dry.  Neurological:     Mental Status: She is alert and oriented to person, place, and time.     Deep Tendon Reflexes: Reflexes are normal and symmetric.     Reflex Scores:      Brachioradialis reflexes are 2+ on the right side and 2+ on the left side.      Patellar reflexes are 2+ on the right side and 2+ on the left side. Psychiatric:        Attention and Perception: Attention normal.        Mood and Affect: Mood normal.        Speech: Speech normal.        Behavior:  Behavior normal. Behavior is cooperative.        Thought Content: Thought content normal.    Results for orders placed or performed in visit on 12/11/23  TSH + free T4   Collection Time: 12/26/23  4:01 PM  Result Value Ref Range   TSH 2.560 0.450 - 4.500 uIU/mL   Free T4 1.30 0.82 - 1.77 ng/dL  U04 and Folate Panel   Collection Time: 12/26/23  4:01 PM  Result Value Ref Range   Vitamin B-12 1,457 (H) 232 - 1,245 pg/mL   Folate 7.6 >3.0 ng/mL  Ferritin   Collection Time: 12/26/23  4:01 PM  Result Value Ref Range   Ferritin 59 15 - 150 ng/mL      Assessment & Plan:   Problem List Items Addressed This Visit       Other   Obesity - Primary   BMI 38.74, with 12 lbs lost over past 3 months.  Will continue Zepbound, with increase to 10 MG, continue to follow with weight management provider.  She is tolerating well without ADR.  Recommended eating smaller high protein, low fat meals more frequently and exercising 30 mins a day 5 times a week with a goal of 10-15lb weight loss in the next 3 months. Patient voiced their understanding and motivation to adhere to these recommendations.         Relevant Medications   tirzepatide (ZEPBOUND) 10 MG/0.5ML Pen       Follow up plan: Return in about 6 months (around 07/25/2024) for WEIGHT CHECK.

## 2024-01-23 NOTE — Assessment & Plan Note (Signed)
 BMI 38.74, with 12 lbs lost over past 3 months.  Will continue Zepbound, with increase to 10 MG, continue to follow with weight management provider.  She is tolerating well without ADR.  Recommended eating smaller high protein, low fat meals more frequently and exercising 30 mins a day 5 times a week with a goal of 10-15lb weight loss in the next 3 months. Patient voiced their understanding and motivation to adhere to these recommendations.

## 2024-02-11 ENCOUNTER — Telehealth: Payer: Self-pay | Admitting: Internal Medicine

## 2024-02-11 ENCOUNTER — Telehealth: Payer: Self-pay | Admitting: Nurse Practitioner

## 2024-02-11 NOTE — Telephone Encounter (Signed)
 Lvm & sent mychart msg to move 02/26/24 appointment-[facility]

## 2024-02-11 NOTE — Telephone Encounter (Signed)
error 

## 2024-02-12 ENCOUNTER — Encounter (INDEPENDENT_AMBULATORY_CARE_PROVIDER_SITE_OTHER): Admitting: Internal Medicine

## 2024-02-12 DIAGNOSIS — G4733 Obstructive sleep apnea (adult) (pediatric): Secondary | ICD-10-CM

## 2024-02-20 ENCOUNTER — Telehealth: Payer: Self-pay | Admitting: Internal Medicine

## 2024-02-20 NOTE — Telephone Encounter (Signed)
 02/12/2024 SS scanned into [employer]. [facility] to schedule cpap titration-[facility]

## 2024-02-26 ENCOUNTER — Ambulatory Visit: Payer: BC Managed Care – PPO | Admitting: Internal Medicine

## 2024-02-26 NOTE — Procedures (Signed)
 [facility]  Polysomnogram Report Part I                                                               Phone: [phone] Fax: [phone]  [name], [name]. Acquisition Number: [id number]  Date of Birth: 04-05-1966 Acquisition Date: 02/12/2024  Referring Physician: Lyndon Code, MD     History: The patient is a 58 year old  who was referred for evaluation of possible sleep apnea with snoring and excessive daytime sleepiness. Medical History: fibromyalgia, migraines, arthritis.  Medications: Inbocin, Magnesium, Tirzepatide.  Procedure: This routine overnight polysomnogram was performed on the Alice 5 using the standard diagnostic protocol. This included 6 channels of EEG, 2 channels of EOG, chin EMG, bilateral anterior tibialis EMG, nasal/oral thermistor, PTAF (nasal pressure transducer), chest and abdominal wall movements, EKG, and pulse oximetry.  Description: The total recording time was 432.0 minutes. The total sleep time was 385.5 minutes. There were a total of 46.5 minutes of wakefulness after sleep onset for a slightly reducedsleep efficiency of 89.2%. The latency to sleep onset was shortat 0.0 minutes. The R sleep onset latency was within normal limits at 91.0 minutes. Sleep parameters, as a percentage of the total sleep time, demonstrated 4.5% of sleep was in N1 sleep, 63.4% N2, 11.0% N3 and 21.0% R sleep. There were a total of 87 arousals for an arousal index of 13.5 arousals per hour of sleep that was slightly elevated.  Respiratory monitoring demonstrated   snoring . There were 48 apneas and hypopneas for an Apnea Hypopnea Index of 7.5 apneas and hypopneas per hour of sleep. The REM related apnea hypopnea index was 20.0/hr of REM sleep compared to a NREM AHI of 4.1/hr.  The average duration of the respiratory events was 19.6 seconds with a maximum duration of 55.0 seconds. The respiratory events occurred mostly in the supine position with an AHI of 9.4. The  respiratory events were associated with peripheral oxygen desaturations on the average to 92%. The lowest oxygen desaturation associated with a respiratory event was 87%. Additionally, the baseline oxygen saturation during wakefulness was 96%, during NREM sleep averaged 95%, and during REM sleep averaged  95%. The total duration of oxygen < 90% was 0.2 minutes.  Cardiac monitoring-  demonstrate transient cardiac decelerations associated with the apneas.  significant cardiac rhythm irregularities.   Periodic limb movement monitoring- did not demonstrate periodic limb movements.      Impression: This routine overnight polysomnogram demonstrated significant, REM-related obstructive sleep apnea with an overall Apnea Hypopnea Index of 7.5 apneas and hypopneas per hour of sleep. Most of the REM events occurred in the supine position. The lowest desaturation to 87%.    slightly reduced sleep efficiency with aslightly elevated arousal index. These findings would appear to be due to the obstructive sleep apnea.  Recommendations:    A CPAP titration would be recommended for the sleep apnea. Some supine sleep should be ensured to optimize the titration.  Would recommend weight loss in a patient with a BMI of 36.9.  Alternative treatment options may include: avoiding the supine sleep position, a nasal resistance device, an oral appliance or ENT surgery in the appropriate clinical context.     Yevonne Pax, MD, [facility] Diplomate ABMS-Pulmonary, Critical  Care and Sleep Medicine  Electronically reviewed and digitally signed  [facility] Polysomnogram Report Part II  Phone: [phone] Fax: [phone]  Patient last name [facility] Neck Size 15.5 in. Acquisition [id number]  Patient first name [name]. Weight 250.0 lbs. Started 02/12/2024 at 10:22:13 PM  Birth date November 06, 1966 Height 69.0 in. Stopped 02/13/2024 at 5:46:49 AM  Age 14 BMI 36.9 lb/in2 Duration 432.0  Study Type Adult      Otho Perl, RPSGT   Reviewed by: Valentino Hue. Henke, PhD, ABSM, FAASM Sleep Data: Lights Out: 10:29:43 PM Sleep Onset: 10:29:43 PM  Lights On: 5:41:43 AM Sleep Efficiency: 89.2 %  Total Recording Time: 432.0 min Sleep Latency (from Lights Off) 0.0 min  Total Sleep Time (TST): 385.5 min R Latency (from Sleep Onset): 91.0 min  Sleep Period Time: 431.0 min Total number of awakenings: 12  Wake during sleep: 45.5 min Wake After Sleep Onset (WASO): 46.5 min   Sleep Data:         Arousal Summary: Stage  Latency from lights out (min) Latency from sleep onset (min) Duration (min) % Total Sleep Time  Normal values  N 1 3.0 3.0 17.5 4.5 (5%)  N 2 0.0 0.0 244.5 63.4 (50%)  N 3 20.5 20.5 42.5 11.0 (20%)  R 91.0 91.0 81.0 21.0 (25%)   Number Index  Spontaneous 71 11.1  Apneas & Hypopneas 8 1.2  RERAs 0 0.0       (Apneas & Hypopneas & RERAs)  (8) (1.2)  Limb Movement 11 1.7  Snore 0 0.0  TOTAL 90 14.0     Respiratory Data:  CA OA MA Apnea Hypopnea* A+ H RERA Total  Number 0 3 0 3 45 48 0 48  Mean Dur (sec) 0.0 11.0 0.0 11.0 20.2 19.6 0.0 19.6  Max Dur (sec) 0.0 12.0 0.0 12.0 55.0 55.0 0.0 55.0  Total Dur (min) 0.0 0.6 0.0 0.6 15.2 15.7 0.0 15.7  % of TST 0.0 0.1 0.0 0.1 3.9 4.1 0.0 4.1  Index (#/h TST) 0.0 0.5 0.0 0.5 7.0 7.5 0.0 7.5  *Hypopneas scored based on 4% or greater desaturation.  Sleep Stage:        REM NREM TST  AHI 20.0 4.1 7.5  RDI 20.0 4.1 7.5           Body Position Data:  Sleep (min) TST (%) REM (min) NREM (min) CA (#) OA (#) MA (#) HYP (#) AHI (#/h) RERA (#) RDI (#/h) Desat (#)  Supine 275.6 71.49 63.6 212.0 0 3 0 40 9.4 0 9.4 44  Non-Supine 109.90 28.51 17.40 92.50 0.00 0.00 0.00 5.00 2.73 0 2.73 5.00  Left: 0.0 0.00 0.0 0.0 0 0 0 0 0.0 0 0.00 0  Right: 109.9 28.51 17.4 92.5 0 0 0 5 2.7 0 2.7 5     Snoring: Total number of snoring episodes  0  Total time with snoring    min (   % of sleep)   Oximetry Distribution:             WK REM NREM TOTAL   Average (%)   96 95 95 95  < 90% 0.0 0.0 0.2 0.2  < 80% 0.0 0.0 0.0 0.0  < 70% 0.0 0.0 0.0 0.0  # of Desaturations* 0 27 22 49  Desat Index (#/hour) 0.0 20.0 4.3 7.6  Desat Max (%) 0 7 7 7   Desat Max Dur (sec) 0.0 87.0 100.0 100.0  Approx Min  O2 during sleep 87  Approx min O2 during a respiratory event 87  Was Oxygen added (Y/N) and final rate :    LPM  *Desaturations based on 4% or greater drop from baseline.   Cheyne Stokes Breathing: None Present   Heart Rate Summary:  Average Heart Rate During Sleep 72.5 bpm      Highest Heart Rate During Sleep (95th %) 79.0 bpm      Highest Heart Rate During Sleep 179 bpm (artifact)  Highest Heart Rate During Recording (TIB) 220 bpm (artifact)   Heart Rate Observations: Event Type # Events   Bradycardia 0 Lowest HR Scored: N/A  Sinus Tachycardia During Sleep 0 Highest HR Scored: N/A  Narrow Complex Tachycardia 0 Highest HR Scored: N/A  Wide Complex Tachycardia 0 Highest HR Scored: N/A  Asystole 0 Longest Pause: N/A  Atrial Fibrillation 0 Duration Longest Event: N/A  Other Arrythmias   Type:    Periodic Limb Movement Data: (Primary legs unless otherwise noted) Total # Limb Movement 11 Limb Movement Index 1.7  Total # PLMS    PLMS Index     Total # PLMS Arousals    PLMS Arousal Index     Percentage Sleep Time with PLMS   min (   % sleep)  Mean Duration limb movements (secs)

## 2024-03-04 ENCOUNTER — Ambulatory Visit: Admitting: Internal Medicine

## 2024-03-04 ENCOUNTER — Encounter: Payer: Self-pay | Admitting: Internal Medicine

## 2024-03-04 VITALS — BP 110/60 | HR 83 | Temp 97.8°F | Resp 16 | Ht 69.0 in | Wt 253.0 lb

## 2024-03-04 DIAGNOSIS — E162 Hypoglycemia, unspecified: Secondary | ICD-10-CM

## 2024-03-04 DIAGNOSIS — G4733 Obstructive sleep apnea (adult) (pediatric): Secondary | ICD-10-CM | POA: Diagnosis not present

## 2024-03-04 DIAGNOSIS — R42 Dizziness and giddiness: Secondary | ICD-10-CM

## 2024-03-04 DIAGNOSIS — Z6837 Body mass index (BMI) 37.0-37.9, adult: Secondary | ICD-10-CM

## 2024-03-04 LAB — POCT CBG (FASTING - GLUCOSE)-MANUAL ENTRY: Glucose Fasting, POC: 105 mg/dL — AB (ref 70–99)

## 2024-03-04 MED ORDER — LANCET DEVICE MISC: 0 refills | Status: DC

## 2024-03-04 MED ORDER — BLOOD GLUCOSE TEST VI STRP: ORAL_STRIP | 1 refills | Status: DC

## 2024-03-04 MED ORDER — LANCETS MISC. MISC: 0 refills | Status: DC

## 2024-03-04 MED ORDER — BLOOD GLUCOSE MONITORING SUPPL DEVI: 0 refills | Status: DC

## 2024-03-04 NOTE — Progress Notes (Signed)
 [facility] [address] [place], [place] [address]  Internal MEDICINE  Office Visit Note  [name]  [id number]  [id number]  Date of Service: 03/04/2024  Chief Complaint  Patient presents with   Medical Management of Chronic Issues    Weight management   Dizziness    Few days after zepbound  taking     HPI Pt is seen for routine follow up 1.  Results of baseline sleep studies are discussed with her patient does have obstructive sleep apnea, she is willing to have a CPAP titration 2.  She continues to do well on her Zepbound  however after increasing the dose to patient did have some dizzy spells, she did not keep a record of her glucose as she does not have a glucometer 3.  After resuming carbohydrates patient improved 4.  She has increased her activity    Current Medication: Outpatient Encounter Medications as of 03/04/2024  Medication Sig   Blood Glucose Monitoring Suppl DEVI May substitute to any manufacturer covered by patient's insurance.check as needed for periods of hypoglycemia   Glucose Blood (BLOOD GLUCOSE TEST STRIPS) STRP Check once a day as needed for low blood sugar   indomethacin  (INDOCIN ) 50 MG capsule Take 1 capsule (50 mg total) by mouth 2 (two) times daily as needed. Take with a meal.   Lancet Device MISC Check as needed once a day for hypoglycemia May substitute to any manufacturer covered by patient's insurance.   Lancets Misc. MISC May substitute to any manufacturer covered by patient's insurance.   magnesium oxide (MAG-OX) 400 MG tablet Take 400 mg by mouth daily.   tirzepatide  (ZEPBOUND ) 10 MG/0.5ML Pen Inject 10 mg into the skin once a week.   No facility-administered encounter medications on file as of 03/04/2024.    Surgical History: Past Surgical History:  Procedure Laterality Date   APPENDECTOMY     COLPOSCOPY N/A 04/12/2022   Procedure: COLPOSCOPY;  Surgeon: Kris Pester, MD;  Location: [facility] ORS;  Service:  Gynecology;  Laterality: N/A;   DILATATION & CURETTAGE/HYSTEROSCOPY WITH MYOSURE N/A 04/12/2022   Procedure: DILATATION & CURETTAGE/HYSTEROSCOPY WITH MYOSURE, POLYPECTOMY X2;  Surgeon: Kris Pester, MD;  Location: [facility] ORS;  Service: Gynecology;  Laterality: N/A;   LAPAROSCOPIC APPENDECTOMY N/A 10/02/2019   Procedure: APPENDECTOMY LAPAROSCOPIC;  Surgeon: Emmalene Hare, MD;  Location: [facility] ORS;  Service: General;  Laterality: N/A;    Medical History: Past Medical History:  Diagnosis Date   Complication of anesthesia    pt gasping for air after surgery   Concussion 2008   Fibromyalgia    Headache    migraine   Hx of migraines    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HCC)    Viral meningitis     Family History: Family History  Problem Relation Age of Onset   Hyperlipidemia Mother    Heart Problems Father    Healthy Sister    Autism spectrum disorder Son    Autism spectrum disorder Son    Autism spectrum disorder Son    Breast cancer Neg Hx     Social History   Socioeconomic History   Marital status: Married    Spouse name: Debborah Fairly   Number of children: Not on file   Years of education: Not on file   Highest education level: Bachelor's degree (e.g., BA, AB, BS)  Occupational History   Occupation: [employer] - in [employer]  Tobacco Use   Smoking status: Former    Current packs/day: 0.00  Average packs/day: 0.3 packs/day for 3.0 years (0.8 ttl pk-yrs)    Types: Cigarettes    Start date: 08/25/2010    Quit date: 08/25/2013    Years since quitting: 10.5   Smokeless tobacco: Never   Tobacco comments:    no smoking in 5 days  Vaping Use   Vaping status: Never Used  Substance and Sexual Activity   Alcohol use: Yes    Alcohol/week: 1.0 standard drink of alcohol    Types: 1 Cans of beer per week    Comment: occ   Drug use: No   Sexual activity: Yes  Other Topics Concern   Not on file  Social History Narrative   Not on file   Social Drivers of Health   Financial  Resource Strain: Low Risk  (10/23/2023)   Overall Financial Resource Strain (CARDIA)    Difficulty of Paying Living Expenses: Not hard at all  Food Insecurity: No Food Insecurity (10/23/2023)   Hunger Vital Sign    Worried About Running Out of Food in the Last Year: Never true    Ran Out of Food in the Last Year: Never true  Transportation Needs: No Transportation Needs (10/23/2023)   PRAPARE - Administrator, Civil Service (Medical): No    Lack of Transportation (Non-Medical): No  Physical Activity: Insufficiently Active (10/23/2023)   Exercise Vital Sign    Days of Exercise per Week: 2 days    Minutes of Exercise per Session: 30 min  Stress: No Stress Concern Present (10/23/2023)   [employer] of Occupational Health - Occupational Stress Questionnaire    Feeling of Stress : Not at all  Social Connections: Moderately Integrated (10/23/2023)   Social Connection and Isolation Panel [NHANES]    Frequency of Communication with Friends and Family: Once a week    Frequency of Social Gatherings with Friends and Family: Once a week    Attends Religious Services: 1 to 4 times per year    Active Member of [employer] or Organizations: Yes    Attends Banker Meetings: 1 to 4 times per year    Marital Status: Married  Catering manager Violence: Not At Risk (01/05/2022)   Humiliation, Afraid, Rape, and Kick questionnaire    Fear of Current or Ex-Partner: No    Emotionally Abused: No    Physically Abused: No    Sexually Abused: No      Review of Systems  Constitutional:  Negative for fatigue and fever.  HENT:  Negative for congestion, mouth sores and postnasal drip.   Respiratory:  Negative for cough.   Cardiovascular:  Negative for chest pain.  Genitourinary:  Negative for flank pain.  Neurological:  Positive for light-headedness. Negative for headaches.  Psychiatric/Behavioral: Negative.      Vital Signs: BP 110/60 Comment: standing  Pulse 83   Temp 97.8 F  (36.6 C)   Resp 16   Ht 5\' 9"  (1.753 m)   Wt 253 lb (114.8 kg)   LMP 08/25/2015   SpO2 98%   BMI 37.36 kg/m    Physical Exam Constitutional:      Appearance: Normal appearance.  HENT:     Head: Normocephalic and atraumatic.     Nose: Nose normal.     Mouth/Throat:     Mouth: Mucous membranes are moist.     Pharynx: No posterior oropharyngeal erythema.  Eyes:     Extraocular Movements: Extraocular movements intact.     Pupils: Pupils are equal, round, and reactive  to light.  Cardiovascular:     Pulses: Normal pulses.     Heart sounds: Normal heart sounds.  Pulmonary:     Effort: Pulmonary effort is normal.     Breath sounds: Normal breath sounds.  Neurological:     General: No focal deficit present.     Mental Status: She is alert.  Psychiatric:        Mood and Affect: Mood normal.        Behavior: Behavior normal.        Assessment/Plan: 1. Dizziness (Primary) Need to monitor degree of hypoglycemia if any patient was instructed to keep a record - POCT CBG (Fasting - Glucose) - Blood Glucose Monitoring Suppl DEVI; May substitute to any manufacturer covered by patient's insurance.check as needed for periods of hypoglycemia  Dispense: 1 each; Refill: 0 - Glucose Blood (BLOOD GLUCOSE TEST STRIPS) STRP; Check once a day as needed for low blood sugar  Dispense: 100 strip; Refill: 1 - Lancet Device MISC; Check as needed once a day for hypoglycemia May substitute to any manufacturer covered by patient's insurance.  Dispense: 1 each; Refill: 0 - Lancets Misc. MISC; May substitute to any manufacturer covered by patient's insurance.  Dispense: 100 each; Refill: 0  2. Hypoglycemia Questionable hypoglycemia will monitor - POCT CBG (Fasting - Glucose)  3. OSA (obstructive sleep apnea) Patient does have abnormal sleep study baseline will need a CPAP titration will order 1 patient continues to do well she is losing weight encouraged her - Cpap titration; Future  4. BMI  37.0-37.9, adult Patient is doing well - Cpap titration; Future   General Counseling: Dandria verbalizes understanding of the findings of todays visit and agrees with plan of treatment. I have discussed any further diagnostic evaluation that may be needed or ordered today. We also reviewed her medications today. she has been encouraged to call the office with any questions or concerns that should arise related to todays visit.    Orders Placed This Encounter  Procedures   POCT CBG (Fasting - Glucose)   Cpap titration    Meds ordered this encounter  Medications   Blood Glucose Monitoring Suppl DEVI    Sig: May substitute to any manufacturer covered by patient's insurance.check as needed for periods of hypoglycemia    Dispense:  1 each    Refill:  0   Glucose Blood (BLOOD GLUCOSE TEST STRIPS) STRP    Sig: Check once a day as needed for low blood sugar    Dispense:  100 strip    Refill:  1   Lancet Device MISC    Sig: Check as needed once a day for hypoglycemia May substitute to any manufacturer covered by patient's insurance.    Dispense:  1 each    Refill:  0   Lancets Misc. MISC    Sig: May substitute to any manufacturer covered by patient's insurance.    Dispense:  100 each    Refill:  0    Total time spent:30 Minutes Time spent includes review of chart, medications, test results, and follow up plan with the patient.   [place] Controlled Substance Database was reviewed by me.   Dr Arlenis Blaydes M Shivam Mestas Internal medicine

## 2024-03-27 ENCOUNTER — Encounter (INDEPENDENT_AMBULATORY_CARE_PROVIDER_SITE_OTHER): Admitting: Internal Medicine

## 2024-03-27 DIAGNOSIS — G4733 Obstructive sleep apnea (adult) (pediatric): Secondary | ICD-10-CM | POA: Diagnosis not present

## 2024-04-02 ENCOUNTER — Telehealth: Payer: Self-pay | Admitting: Internal Medicine

## 2024-04-02 NOTE — Telephone Encounter (Signed)
 Received Cpap order from [facility]. Gave to dfk-Toni

## 2024-04-05 DIAGNOSIS — S90121A Contusion of right lesser toe(s) without damage to nail, initial encounter: Secondary | ICD-10-CM | POA: Diagnosis not present

## 2024-04-09 NOTE — Procedures (Signed)
 [facility]  Polysomnogram Report Part I  Phone: [phone] Fax: [phone]  Patient Name: Julie, Mcknight Acquisition Number: [id number]  Date of Birth: 25-Feb-1966 Acquisition Date: 03/27/2024  Referring Physician: Lawton Price MD     History: The patient is a 59 year old  . Medical History: fibromyalgia, migraines, arthrithis.  Medications: inbocin, magnesium, tirzepatide .  Procedure: This routine overnight polysomnogram was performed on the Alice 5 using the standard CPAP protocol. This included 6 channels of EEG, 2 channels of EOG, chin EMG, bilateral anterior tibialis EMG, nasal/oral thermistor, PTAF (nasal pressure transducer), chest and abdominal wall movements, EKG, and pulse oximetry.  Description: The total recording time was 418.5 minutes. The total sleep time was 379.0 minutes. There were a total of 39.5 minutes of wakefulness after sleep onset for a slightly reducedsleep efficiency of 90.6%. The latency to sleep onset was shortat 0.0 minutes. The R sleep onset latency wasslightly prolonged at 124.0 minutes. Sleep parameters, as a percentage of the total sleep time, demonstrated 6.9% of sleep was in N1 sleep, 55.5% N2, 19.5% N3 and 18.1% R sleep. There were a total of 56 arousals for an arousal index of 8.9 arousals per hour of sleep that was normal.  Overall, there were a total of 27 respiratory events for a respiratory disturbance index, which includes apneas, hypopneas and RERAs (increased respiratory effort) of 4.3 respiratory events per hour of sleep during the pressure titration.  was initiated at 4 cm H2O at lights out, 10:31 p.m. It was titrated in 2 cm increments occasional respiratory events to 7 cm H2O. The apnea was controlled at this pressure in supine, REM sleep. The pressure was further titrated for non-REM events to the final pressure of 10 cm H2O. The apnea was controlled at this pressure.  Additionally, the baseline oxygen saturation during wakefulness  was 97%, during NREM sleep averaged 96%, and during REM sleep averaged 95%. The total duration of oxygen < 90% was 0.6 minutes.  Cardiac monitoring-  significant cardiac rhythm irregularities.   Periodic limb movement monitoring- demonstrated that there were 5 periodic limb movements for a periodic limb movement index of 0.8 periodic limb movements per hour of sleep.   Impression: This patient's obstructive sleep apnea demonstrated significant improvement with the utilization of nasal  at 10 cm H2O.      Recommendations: Would recommend utilization of nasal  at 10 cm H2O.      An AirFit F40  mask, size Small Wide , was used. Chin strap used during study- No. Humidifier used during study- Yes.     Cordie Deters, MD, [facility] Diplomate ABMS-Pulmonary, Critical Care and Sleep Medicine  Electronically reviewed and digitally signed  [facility] CPAP/BIPAP Polysomnogram Report Part II Phone: [phone] Fax: [phone]  Patient last name [facility] Neck Size  18  in. Acquisition [id number]  Patient first name [name] Weight 260.0 lbs. Started 03/27/2024 at 10:26:41 PM  Birth date 02/17/1966 Height 69.0 in. Stopped 03/28/2024 at 5:32:41 AM  Age 36      Type Adult BMI 38.4 lb/in2 Duration 418.5  Auburn Blaze. RPSGT. // Aron Bien  Reviewed by: Delmus Ferri. Henke, PhD, ABSM, FAASM Sleep Data: Lights Out: 10:31:41 PM Sleep Onset: 10:31:41 PM  Lights On: 5:30:11 AM Sleep Efficiency: 90.6 %  Total Recording Time: 418.5 min Sleep Latency (from Lights Off) 0.0 min  Total Sleep Time (TST): 379.0 min R Latency (from Sleep Onset): 124.0 min  Sleep Period Time: 418.5 min Total number  of awakenings: 17  Wake during sleep: 39.5 min Wake After Sleep Onset (WASO): 39.5 min   Sleep Data:         Arousal Summary: Stage  Latency from lights out (min) Latency from sleep onset (min) Duration (min) % Total Sleep Time  Normal values  N 1 7.5 7.5 26.0 6.9 (5%)  N 2 0.0 0.0 210.5 55.5 (50%)  N 3  10.0 10.0 74.0 19.5 (20%)  R 124.0 124.0 68.5 18.1 (25%)   Number Index  Spontaneous 43 6.8  Apneas & Hypopneas 10 1.6  RERAs 0 0.0       (Apneas & Hypopneas & RERAs)  (10) (1.6)  Limb Movement 3 0.5  Snore 0 0.0  TOTAL 56 8.9     Respiratory Data:  CA OA MA Apnea Hypopnea* A+ H RERA Total  Number 2 5 0 7 20 27  0 27  Mean Dur (sec) 13.3 15.7 0.0 15.0 16.8 16.3 0.0 16.3  Max Dur (sec) 14.5 21.5 0.0 21.5 22.0 22.0 0.0 22.0  Total Dur (min) 0.4 1.3 0.0 1.8 5.6 7.3 0.0 7.3  % of TST 0.1 0.3 0.0 0.5 1.5 1.9 0.0 1.9  Index (#/h TST) 0.3 0.8 0.0 1.1 3.2 4.3 0.0 4.3  *Hypopneas scored based on 4% or greater desaturation.  Sleep Stage:         REM NREM TST  AHI 5.3 4.1 4.3  RDI 5.3 4.1 4.3   Sleep (min) TST (%) REM (min) NREM (min) CA (#) OA (#) MA (#) HYP (#) AHI (#/h) RERA (#) RDI (#/h) Desat (#)  Supine 302.0 79.68 41.5 260.5 2 5  0 20 5.4 0 5.4 27  Non-Supine 77.00 20.32 27.00 50.00 0.00 0.00 0.00 0.00 0.00 0 0.00 0.00  Left: 77.0 20.32 27.0 50.0 0 0 0 0 0.0 0 0.00 0     Snoring: Total number of snoring episodes  0  Total time with snoring    min (   % of sleep)   Oximetry Distribution:             WK REM NREM TOTAL  Average (%)   97 95 96 96  < 90% 0.1 0.0 0.5 0.6  < 80% 0.0 0.0 0.0 0.0  < 70% 0.0 0.0 0.0 0.0  # of Desaturations* 0 6 20 26   Desat Index (#/hour) 0.0 5.3 3.9 4.1  Desat Max (%) 0 6 12 12   Desat Max Dur (sec) 0.0 56.0 119.0 119.0  Approx Min O2 during sleep 85  Approx min O2 during a respiratory event 85  Was Oxygen added (Y/N) and final rate :    LPM  *Desaturations based on 4% or greater drop from baseline.   Cheyne Stokes Breathing: None Present   Heart Rate Summary:  Average Heart Rate During Sleep 64.4 bpm      Highest Heart Rate During Sleep (95th %) 70.0 bpm      Highest Heart Rate During Sleep 142 bpm      Highest Heart Rate During Recording (TIB) 229 bpm (artifact)   Heart Rate Observations: Event Type # Events    Bradycardia 0 Lowest HR Scored: N/A  Sinus Tachycardia During Sleep 0 Highest HR Scored: N/A  Narrow Complex Tachycardia 0 Highest HR Scored: N/A  Wide Complex Tachycardia 0 Highest HR Scored: N/A  Asystole 0 Longest Pause: N/A  Atrial Fibrillation 0 Duration Longest Event: N/A  Other Arrythmias   Type:   Periodic Limb Movement Data: (Primary legs unless otherwise  noted) Total # Limb Movement 5 Limb Movement Index 0.8  Total # PLMS 5 PLMS Index 0.8  Total # PLMS Arousals 3 PLMS Arousal Index 0.5  Percentage Sleep Time with PLMS 3.63min (0.9 % sleep)  Mean Duration limb movements (secs) 198.0    IPAP Level (cmH2O) EPAP Level (cmH2O) Total Duration (min) Sleep Duration (min) Sleep (%) REM (%) CA  #) OA # MA # HYP #) AHI (#/hr) RERAs # RERAs (#/hr) RDI (#/hr)  4 4 72.2 62.7 86.8 0.0 0 2 0 3 4.8 0 0.0 4.8  5 5  54.4 52.9 97.2 5.3 0 1 0 2 3.4 0 0.0 3.4  6 6  11.3 11.3 100.0 100.0 0 0 0 4 21.2 0 0.0 21.2  7 7  122.8 111.0 90.4 21.7 1 0 0 4 2.7 0 0.0 2.7  8 8  4.2 4.2 100.0 0.0 0 0 0 4 57.1 0 0.0 57.1  9 9  51.2 49.4 96.5 0.0 0 2 0 3 6.1 0 0.0 6.1  10 10  94.3 86.0 91.2 28.6 1 0 0 0 0.7 0 0.0 0.7

## 2024-04-11 ENCOUNTER — Telehealth: Payer: Self-pay | Admitting: Internal Medicine

## 2024-04-11 NOTE — Telephone Encounter (Signed)
 Cpap & supply order signed. Faxed back to [facility]; [phone]. Scanned-Toni

## 2024-04-18 DIAGNOSIS — G4733 Obstructive sleep apnea (adult) (pediatric): Secondary | ICD-10-CM | POA: Diagnosis not present

## 2024-05-19 DIAGNOSIS — G4733 Obstructive sleep apnea (adult) (pediatric): Secondary | ICD-10-CM | POA: Diagnosis not present

## 2024-06-16 ENCOUNTER — Telehealth: Payer: Self-pay | Admitting: Internal Medicine

## 2024-06-16 NOTE — Telephone Encounter (Signed)
 Lvm to move 07/08/24 appointment -[name]

## 2024-06-18 DIAGNOSIS — G4733 Obstructive sleep apnea (adult) (pediatric): Secondary | ICD-10-CM | POA: Diagnosis not present

## 2024-07-01 ENCOUNTER — Ambulatory Visit: Admitting: Internal Medicine

## 2024-07-08 ENCOUNTER — Ambulatory Visit: Admitting: Internal Medicine

## 2024-07-15 ENCOUNTER — Encounter: Payer: Self-pay | Admitting: Internal Medicine

## 2024-07-15 ENCOUNTER — Ambulatory Visit: Admitting: Internal Medicine

## 2024-07-15 VITALS — BP 120/80 | HR 71 | Temp 97.8°F | Resp 16 | Ht 69.0 in | Wt 249.0 lb

## 2024-07-15 DIAGNOSIS — E162 Hypoglycemia, unspecified: Secondary | ICD-10-CM

## 2024-07-15 DIAGNOSIS — E66812 Obesity, class 2: Secondary | ICD-10-CM | POA: Diagnosis not present

## 2024-07-15 DIAGNOSIS — G4733 Obstructive sleep apnea (adult) (pediatric): Secondary | ICD-10-CM

## 2024-07-15 DIAGNOSIS — N951 Menopausal and female climacteric states: Secondary | ICD-10-CM | POA: Diagnosis not present

## 2024-07-15 DIAGNOSIS — G479 Sleep disorder, unspecified: Secondary | ICD-10-CM

## 2024-07-15 DIAGNOSIS — E6609 Other obesity due to excess calories: Secondary | ICD-10-CM

## 2024-07-15 DIAGNOSIS — Z6836 Body mass index (BMI) 36.0-36.9, adult: Secondary | ICD-10-CM

## 2024-07-15 DIAGNOSIS — Z6837 Body mass index (BMI) 37.0-37.9, adult: Secondary | ICD-10-CM

## 2024-07-15 NOTE — Progress Notes (Signed)
 [facility] [address] [place], [place] [address]  Internal MEDICINE  Office Visit Note  [name]  [id number]  [id number]  Date of Service: 08/18/2024  Chief Complaint  Patient presents with   Medical Management of Chronic Issues    Weight management     HPI Pt is seen for management of obesity  Continues to lose weight however it has been somewhat on slow pace, she does feel somewhat frustrated. Will like to see if something else can be done  2.  She is on high dose Zepbound ( 10 mg) Requesting to increase 3.  She is menopausal with no HRT  4.  Activity level is work plus household chores  5.  She is consuming more than 1200 kcal 6.  Compliant with her CPAP 7. She thinks she might have Lipedema   Current Medication: Outpatient Encounter Medications as of 07/15/2024  Medication Sig   Blood Glucose Monitoring Suppl DEVI May substitute to any manufacturer covered by patient's insurance.check as needed for periods of hypoglycemia   Glucose Blood (BLOOD GLUCOSE TEST STRIPS) STRP Check once a day as needed for low blood sugar   indomethacin  (INDOCIN ) 50 MG capsule Take 1 capsule (50 mg total) by mouth 2 (two) times daily as needed. Take with a meal.   Lancet Device MISC Check as needed once a day for hypoglycemia May substitute to any manufacturer covered by patient's insurance.   Lancets Misc. MISC May substitute to any manufacturer covered by patient's insurance.   magnesium oxide (MAG-OX) 400 MG tablet Take 400 mg by mouth daily.   [DISCONTINUED] tirzepatide  (ZEPBOUND ) 10 MG/0.5ML Pen Inject 10 mg into the skin once a week.   No facility-administered encounter medications on file as of 07/15/2024.    Surgical History: Past Surgical History:  Procedure Laterality Date   APPENDECTOMY     COLPOSCOPY N/A 04/12/2022   Procedure: COLPOSCOPY;  Surgeon: Leonce Garnette BIRCH, MD;  Location: [facility];  Service: Gynecology;  Laterality: N/A;    DILATATION & CURETTAGE/HYSTEROSCOPY WITH MYOSURE N/A 04/12/2022   Procedure: DILATATION & CURETTAGE/HYSTEROSCOPY WITH MYOSURE, POLYPECTOMY X2;  Surgeon: Leonce Garnette BIRCH, MD;  Location: [facility];  Service: Gynecology;  Laterality: N/A;   LAPAROSCOPIC APPENDECTOMY N/A 10/02/2019   Procedure: APPENDECTOMY LAPAROSCOPIC;  Surgeon: Desiderio Schanz, MD;  Location: [facility];  Service: General;  Laterality: N/A;    Medical History: Past Medical History:  Diagnosis Date   Allergy Teen years   Mild   Anemia    Slight   Complication of anesthesia    pt gasping for air after surgery   Concussion 2008   Fibromyalgia    Headache    migraine   Hx of migraines    Hypertension Previous visit   2 episodes of hbp   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HCC)    Viral meningitis     Family History: Family History  Problem Relation Age of Onset   Hyperlipidemia Mother    Arthritis Mother    Hypertension Mother    Heart Problems Father    Alcohol abuse Father    Healthy Sister    Autism spectrum disorder Son    Autism spectrum disorder Son    Autism spectrum disorder Son    Arthritis Maternal Grandmother    ADD / ADHD Sister    ADD / ADHD Son    Breast cancer Neg Hx     Social History   Socioeconomic History   Marital status: Married  Spouse name: Dale   Number of children: Not on file   Years of education: Not on file   Highest education level: Bachelor's degree (e.g., BA, AB, BS)  Occupational History   Occupation: [employer] - in [employer]  Tobacco Use   Smoking status: Former    Current packs/day: 0.00    Average packs/day: 0.3 packs/day for 3.0 years (0.8 ttl pk-yrs)    Types: Cigarettes    Start date: 08/25/2010    Quit date: 08/25/2013    Years since quitting: 10.9   Smokeless tobacco: Never   Tobacco comments:    no smoking in 5 days  Vaping Use   Vaping status: Never Used  Substance and Sexual Activity   Alcohol use: Yes    Alcohol/week: 1.0 standard drink of alcohol     Types: 1 Cans of beer per week    Comment: occ   Drug use: No   Sexual activity: Yes  Other Topics Concern   Not on file  Social History Narrative   Not on file   Social Drivers of Health   Financial Resource Strain: Low Risk  (10/23/2023)   Overall Financial Resource Strain (CARDIA)    Difficulty of Paying Living Expenses: Not hard at all  Food Insecurity: No Food Insecurity (10/23/2023)   Hunger Vital Sign    Worried About Running Out of Food in the Last Year: Never true    Ran Out of Food in the Last Year: Never true  Transportation Needs: No Transportation Needs (10/23/2023)   PRAPARE - Administrator, Civil Service (Medical): No    Lack of Transportation (Non-Medical): No  Physical Activity: Insufficiently Active (10/23/2023)   Exercise Vital Sign    Days of Exercise per Week: 2 days    Minutes of Exercise per Session: 30 min  Stress: No Stress Concern Present (10/23/2023)   [employer] of Occupational Health - Occupational Stress Questionnaire    Feeling of Stress : Not at all  Social Connections: Moderately Integrated (10/23/2023)   Social Connection and Isolation Panel    Frequency of Communication with Friends and Family: Once a week    Frequency of Social Gatherings with Friends and Family: Once a week    Attends Religious Services: 1 to 4 times per year    Active Member of [employer] or Organizations: Yes    Attends Banker Meetings: 1 to 4 times per year    Marital Status: Married  Catering manager Violence: Not At Risk (01/05/2022)   Humiliation, Afraid, Rape, and Kick questionnaire    Fear of Current or Ex-Partner: No    Emotionally Abused: No    Physically Abused: No    Sexually Abused: No      Review of Systems  Constitutional:  Negative for fatigue and fever.  HENT:  Negative for congestion, mouth sores and postnasal drip.   Respiratory:  Negative for cough.   Cardiovascular:  Negative for chest pain.  Genitourinary:  Negative  for flank pain.  Psychiatric/Behavioral: Negative.      Vital Signs: BP 120/80   Pulse 71   Temp 97.8 F (36.6 C)   Resp 16   Ht 5' 9 (1.753 m)   Wt 249 lb (112.9 kg)   LMP 08/25/2015   SpO2 97%   BMI 36.77 kg/m    Physical Exam Constitutional:      Appearance: Normal appearance.  HENT:     Head: Normocephalic and atraumatic.     Nose:  Nose normal.     Mouth/Throat:     Mouth: Mucous membranes are moist.     Pharynx: No posterior oropharyngeal erythema.  Eyes:     Extraocular Movements: Extraocular movements intact.     Pupils: Pupils are equal, round, and reactive to light.  Cardiovascular:     Pulses: Normal pulses.     Heart sounds: Normal heart sounds.  Pulmonary:     Effort: Pulmonary effort is normal.     Breath sounds: Normal breath sounds.  Neurological:     General: No focal deficit present.     Mental Status: She is alert.  Psychiatric:        Mood and Affect: Mood normal.        Behavior: Behavior normal.        Assessment/Plan: 1. OSA (obstructive sleep apnea) (Primary) Continue CPAP  2. Class 2 obesity due to excess calories without serious comorbidity with body mass index (BMI) of 36.0 to 36.9 in adult Will get input fr62m functional medicine at [facility] ( cortisol, letin, pyruvc acid and adipponection     3. Menopausal state Will advise after results are available  - Estradiol  - 17-Hydroxyprogesterone - Testosterone ,Free and Total   General Counseling: Denaya verbalizes understanding of the findings of todays visit and agrees with plan of treatment. I have discussed any further diagnostic evaluation that may be needed or ordered today. We also reviewed her medications today. she has been encouraged to call the office with any questions or concerns that should arise related to todays visit.    Orders Placed This Encounter  Procedures   Estradiol    17-Hydroxyprogesterone   Testosterone ,Free and Total    No orders of the defined types  were placed in this encounter.   Total time spent:35 Minutes Time spent includes review of chart, medications, test results, and follow up plan with the patient.   [place] Controlled Substance Database was reviewed by me.   Dr Shaquayla Klimas M Jameison [facility] Internal medicine

## 2024-07-19 DIAGNOSIS — G4733 Obstructive sleep apnea (adult) (pediatric): Secondary | ICD-10-CM | POA: Diagnosis not present

## 2024-07-22 ENCOUNTER — Encounter: Payer: Self-pay | Admitting: Internal Medicine

## 2024-07-22 DIAGNOSIS — E162 Hypoglycemia, unspecified: Secondary | ICD-10-CM | POA: Diagnosis not present

## 2024-07-22 DIAGNOSIS — G479 Sleep disorder, unspecified: Secondary | ICD-10-CM | POA: Diagnosis not present

## 2024-07-22 DIAGNOSIS — Z6837 Body mass index (BMI) 37.0-37.9, adult: Secondary | ICD-10-CM | POA: Diagnosis not present

## 2024-07-22 DIAGNOSIS — G4733 Obstructive sleep apnea (adult) (pediatric): Secondary | ICD-10-CM | POA: Diagnosis not present

## 2024-07-26 DIAGNOSIS — Z1589 Genetic susceptibility to other disease: Secondary | ICD-10-CM | POA: Insufficient documentation

## 2024-07-26 NOTE — Patient Instructions (Signed)
 Be Involved in Caring For Your Health:  Taking Medications When medications are taken as directed, they can greatly improve your health. But if they are not taken as prescribed, they may not work. In some cases, not taking them correctly can be harmful. To help ensure your treatment remains effective and safe, understand your medications and how to take them. Bring your medications to each visit for review by your provider.  Your lab results, notes, and after visit summary will be available on My Chart. We strongly encourage you to use this feature. If lab results are abnormal the clinic will contact you with the appropriate steps. If the clinic does not contact you assume the results are satisfactory. You can always view your results on My Chart. If you have questions regarding your health or results, please contact the clinic during office hours. You can also ask questions on My Chart.  We at [facility] are grateful that you chose us  to provide your care. We strive to provide evidence-based and compassionate care and are always looking for feedback. If you get a survey from the clinic please complete this so we can hear your opinions.  Healthy Eating, Adult Healthy eating may help you get and keep a healthy body weight, reduce the risk of chronic disease, and live a long and productive life. It is important to follow a healthy eating pattern. Your nutritional and calorie needs should be met mainly by different nutrient-rich foods. What are tips for following this plan? Reading food labels Read labels and choose the following: Reduced or low sodium products. Juices with 100% fruit juice. Foods with low saturated fats (<3 g per serving) and high polyunsaturated and monounsaturated fats. Foods with whole grains, such as whole wheat, cracked wheat, brown rice, and wild rice. Whole grains that are fortified with folic acid. This is recommended for females who are pregnant or who want to  become pregnant. Read labels and do not eat or drink the following: Foods or drinks with added sugars. These include foods that contain brown sugar, corn sweetener, corn syrup, dextrose , fructose, glucose, high-fructose corn syrup, honey, invert sugar, lactose, malt syrup, maltose, molasses, raw sugar, sucrose, trehalose, or turbinado sugar. Limit your intake of added sugars to less than 10% of your total daily calories. Do not eat more than the following amounts of added sugar per day: 6 teaspoons (25 g) for females. 9 teaspoons (38 g) for males. Foods that contain processed or refined starches and grains. Refined grain products, such as white flour, degermed cornmeal, white bread, and white rice. Shopping Choose nutrient-rich snacks, such as vegetables, whole fruits, and nuts. Avoid high-calorie and high-sugar snacks, such as potato chips, fruit snacks, and candy. Use oil-based dressings and spreads on foods instead of solid fats such as butter, margarine, sour cream, or cream cheese. Limit pre-made sauces, mixes, and instant products such as flavored rice, instant noodles, and ready-made pasta. Try more plant-protein sources, such as tofu, tempeh, black beans, edamame, lentils, nuts, and seeds. Explore eating plans such as the Mediterranean diet or vegetarian diet. Try heart-healthy dips made with beans and healthy fats like hummus and guacamole. Vegetables go great with these. Cooking Use oil to saut or stir-fry foods instead of solid fats such as butter, margarine, or lard. Try baking, boiling, grilling, or broiling instead of frying. Remove the fatty part of meats before cooking. Steam vegetables in water  or broth. Meal planning  At meals, imagine dividing your plate into fourths: One-half of  your plate is fruits and vegetables. One-fourth of your plate is whole grains. One-fourth of your plate is protein, especially lean meats, poultry, eggs, tofu, beans, or nuts. Include low-fat  dairy as part of your daily diet. Lifestyle Choose healthy options in all settings, including home, work, school, restaurants, or stores. Prepare your food safely: Wash your hands after handling raw meats. Where you prepare food, keep surfaces clean by regularly washing with hot, soapy water . Keep raw meats separate from ready-to-eat foods, such as fruits and vegetables. Cook seafood, meat, poultry, and eggs to the recommended temperature. Get a food thermometer. Store foods at safe temperatures. In general: Keep cold foods at 84F (4.4C) or below. Keep hot foods at 184F (60C) or above. Keep your freezer at [facility] (-17.8C) or below. Foods are not safe to eat if they have been between the temperatures of 40-184F (4.4-60C) for more than 2 hours. What foods should I eat? Fruits Aim to eat 1-2 cups of fresh, canned (in natural juice), or frozen fruits each day. One cup of fruit equals 1 small apple, 1 large banana, 8 large strawberries, 1 cup (237 g) canned fruit,  cup (82 g) dried fruit, or 1 cup (240 mL) 100% juice. Vegetables Aim to eat 2-4 cups of fresh and frozen vegetables each day, including different varieties and colors. One cup of vegetables equals 1 cup (91 g) broccoli or cauliflower florets, 2 medium carrots, 2 cups (150 g) raw, leafy greens, 1 large tomato, 1 large bell pepper, 1 large sweet potato, or 1 medium white potato. Grains Aim to eat 5-10 ounce-equivalents of whole grains each day. Examples of 1 ounce-equivalent of grains include 1 slice of bread, 1 cup (40 g) ready-to-eat cereal, 3 cups (24 g) popcorn, or  cup (93 g) cooked rice. Meats and other proteins Try to eat 5-7 ounce-equivalents of protein each day. Examples of 1 ounce-equivalent of protein include 1 egg,  oz nuts (12 almonds, 24 pistachios, or 7 walnut halves), 1/4 cup (90 g) cooked beans, 6 tablespoons (90 g) hummus or 1 tablespoon (16 g) peanut butter. A cut of meat or fish that is the size of a deck of  cards is about 3-4 ounce-equivalents (85 g). Of the protein you eat each week, try to have at least 8 sounce (227 g) of seafood. This is about 2 servings per week. This includes salmon, trout, herring, sardines, and anchovies. Dairy Aim to eat 3 cup-equivalents of fat-free or low-fat dairy each day. Examples of 1 cup-equivalent of dairy include 1 cup (240 mL) milk, 8 ounces (250 g) yogurt, 1 ounces (44 g) natural cheese, or 1 cup (240 mL) fortified soy milk. Fats and oils Aim for about 5 teaspoons (21 g) of fats and oils per day. Choose monounsaturated fats, such as canola and olive oils, mayonnaise made with olive oil or avocado oil, avocados, peanut butter, and most nuts, or polyunsaturated fats, such as sunflower, corn, and soybean oils, walnuts, pine nuts, sesame seeds, sunflower seeds, and flaxseed. Beverages Aim for 6 eight-ounce glasses of water  per day. Limit coffee to 3-5 eight-ounce cups per day. Limit caffeinated beverages that have added calories, such as soda and energy drinks. If you drink alcohol: Limit how much you have to: 0-1 drink a day if you are female. 0-2 drinks a day if you are female. Know how much alcohol is in your drink. In the [place] one drink is one 12 oz bottle of beer (355 mL), one 5 oz glass of wine (  148 mL), or one 1 oz glass of hard liquor (44 mL). Seasoning and other foods Try not to add too much salt to your food. Try using herbs and spices instead of salt. Try not to add sugar to food. This information is based on [place] nutrition guidelines. To learn more, visit [employer].be. Exact amounts may vary. You may need different amounts. This information is not intended to replace advice given to you by your health care provider. Make sure you discuss any questions you have with your health care provider. Document Revised: 08/07/2022 Document Reviewed: 08/07/2022 Elsevier Patient Education  2024 [employer].

## 2024-07-28 ENCOUNTER — Ambulatory Visit: Admitting: Nurse Practitioner

## 2024-07-28 ENCOUNTER — Encounter: Payer: Self-pay | Admitting: Nurse Practitioner

## 2024-07-28 VITALS — BP 122/76 | HR 65 | Temp 98.6°F | Resp 15 | Ht 69.02 in | Wt 251.0 lb

## 2024-07-28 DIAGNOSIS — E6609 Other obesity due to excess calories: Secondary | ICD-10-CM | POA: Diagnosis not present

## 2024-07-28 DIAGNOSIS — Z1211 Encounter for screening for malignant neoplasm of colon: Secondary | ICD-10-CM

## 2024-07-28 DIAGNOSIS — E66812 Obesity, class 2: Secondary | ICD-10-CM

## 2024-07-28 DIAGNOSIS — Z6837 Body mass index (BMI) 37.0-37.9, adult: Secondary | ICD-10-CM | POA: Diagnosis not present

## 2024-07-28 MED ORDER — ZEPBOUND 12.5 MG/0.5ML ~~LOC~~ SOAJ: 12.5000 mg | SUBCUTANEOUS | 5 refills | Status: AC

## 2024-07-28 NOTE — Assessment & Plan Note (Addendum)
 BMI 37.05, 2 labs lost since last visit with PCP but feels is stalling.  Has lost inches. We discussed seeing functional medicine provider to further delve into lipedema and labs (cortisol, leptin, adiponectin, selenium, histidine, phenylalanine, and pyruvic acid).  Will continue Zepbound , with increase to 12.5 MG, continue to follow with weight management provider as needed.  She is tolerating well without ADR.  Recommended eating smaller high protein, low fat meals more frequently and exercising 30 mins a day 5 times a week with a goal of 10-15lb weight loss in the next 3 months. Patient voiced their understanding and motivation to adhere to these recommendations.

## 2024-07-28 NOTE — Progress Notes (Signed)
 BP 122/76 (BP Location: Left Arm, Patient Position: Sitting, Cuff Size: Large)   Pulse 65   Temp 98.6 F (37 C) (Oral)   Resp 15   Ht 5' 9.02 (1.753 m)   Wt 251 lb (113.9 kg)   LMP 08/25/2015   SpO2 98%   BMI 37.05 kg/m    Subjective:    Patient ID: [name], female    DOB: 06-07-1966, 58 y.o.   MRN: [id number]  HPI: [name] is a 58 y.o. female  Chief Complaint  Patient presents with   Weight Check    On Zepbound - current dose is 10 mg. Does feel she has platuaed.    WEIGHT GAIN Taking Zepbound  10 MG weekly and tolerating. Follows with Dr. Fernand for weight management.  Has been on 10 MG for 3 months, but feels like has stalled. Has lost 11 inches to waist. Feels she may have some lipidedema, mother has this.  Prior to this was on Ozempic , but had stopped losing with this and switched over.  Has no side effects with medication. Saw dietician for a period. Duration: chronic Previous attempts at weight loss: yes Complications of obesity: HLD, Obesity Peak weight: 290 lbs Weight loss goal:  180 to 190 lbs range Weight loss to date: 2 lbs this past month = 12 pounds in past 3 months Requesting obesity pharmacotherapy: yes Current weight loss supplements/medications: yes Previous weight loss supplements/meds: yes Calories:  1800 calories  Relevant past medical, surgical, family and social history reviewed and updated as indicated. Interim medical history since our last visit reviewed. Allergies and medications reviewed and updated.  Review of Systems  Constitutional:  Negative for activity change, appetite change, diaphoresis, fatigue and fever.  Respiratory:  Negative for cough, chest tightness, shortness of breath and wheezing.   Cardiovascular:  Negative for chest pain, palpitations and leg swelling.  Gastrointestinal: Negative.   Endocrine: Negative.   Neurological: Negative.   Psychiatric/Behavioral: Negative.     Per HPI unless specifically indicated  above     Objective:    BP 122/76 (BP Location: Left Arm, Patient Position: Sitting, Cuff Size: Large)   Pulse 65   Temp 98.6 F (37 C) (Oral)   Resp 15   Ht 5' 9.02 (1.753 m)   Wt 251 lb (113.9 kg)   LMP 08/25/2015   SpO2 98%   BMI 37.05 kg/m   Wt Readings from Last 3 Encounters:  07/28/24 251 lb (113.9 kg)  07/15/24 249 lb (112.9 kg)  03/04/24 253 lb (114.8 kg)    Physical Exam Vitals and nursing note reviewed.  Constitutional:      General: She is awake. She is not in acute distress.    Appearance: She is well-developed and well-groomed. She is obese. She is not ill-appearing or toxic-appearing.  HENT:     Head: Normocephalic.     Right Ear: Hearing and external ear normal.     Left Ear: Hearing and external ear normal.  Eyes:     General: Lids are normal.        Right eye: No discharge.        Left eye: No discharge.     Conjunctiva/sclera: Conjunctivae normal.     Pupils: Pupils are equal, round, and reactive to light.  Neck:     Thyroid : No thyromegaly.     Vascular: No carotid bruit.  Cardiovascular:     Rate and Rhythm: Normal rate and regular rhythm.     Heart  sounds: Normal heart sounds. No murmur heard.    No gallop.  Pulmonary:     Effort: Pulmonary effort is normal. No accessory muscle usage or respiratory distress.     Breath sounds: Normal breath sounds.  Abdominal:     General: Bowel sounds are normal. There is no distension.     Palpations: Abdomen is soft.     Tenderness: There is no abdominal tenderness.  Musculoskeletal:     Cervical back: Normal range of motion and neck supple.     Right lower leg: No edema.     Left lower leg: No edema.  Lymphadenopathy:     Cervical: No cervical adenopathy.  Skin:    General: Skin is warm and dry.  Neurological:     Mental Status: She is alert and oriented to person, place, and time.     Deep Tendon Reflexes: Reflexes are normal and symmetric.     Reflex Scores:      Brachioradialis reflexes are 2+  on the right side and 2+ on the left side.      Patellar reflexes are 2+ on the right side and 2+ on the left side. Psychiatric:        Attention and Perception: Attention normal.        Mood and Affect: Mood normal.        Speech: Speech normal.        Behavior: Behavior normal. Behavior is cooperative.        Thought Content: Thought content normal.    Results for orders placed or performed in visit on 07/15/24  Estradiol    Collection Time: 07/22/24  3:44 PM  Result Value Ref Range   Estradiol  26.0 pg/mL  17-Hydroxyprogesterone   Collection Time: 07/22/24  3:44 PM  Result Value Ref Range   17-OH Progesterone  LCMS <10 ng/dL  Testosterone ,Free and Total   Collection Time: 07/22/24  3:44 PM  Result Value Ref Range   Testosterone  15 4 - 50 ng/dL   Testosterone , Free WILL FOLLOW       Assessment & Plan:   Problem List Items Addressed This Visit       Other   Obesity   BMI 37.05, 2 labs lost since last visit with PCP but feels is stalling.  Has lost inches. We discussed seeing functional medicine provider to further delve into lipedema and labs (cortisol, leptin, adiponectin, selenium, histidine, phenylalanine, and pyruvic acid).  Will continue Zepbound , with increase to 12.5 MG, continue to follow with weight management provider as needed.  She is tolerating well without ADR.  Recommended eating smaller high protein, low fat meals more frequently and exercising 30 mins a day 5 times a week with a goal of 10-15lb weight loss in the next 3 months. Patient voiced their understanding and motivation to adhere to these recommendations.         Relevant Medications   tirzepatide  (ZEPBOUND ) 12.5 MG/0.5ML Pen   Other Visit Diagnoses       Screening for colon cancer    -  Primary   Relevant Orders   Cologuard       Follow up plan: Return in about 4 months (around 11/27/2024) for WEIGHT CHECK.

## 2024-07-29 ENCOUNTER — Encounter: Payer: Self-pay | Admitting: Internal Medicine

## 2024-07-29 LAB — TESTOSTERONE,FREE AND TOTAL
Testosterone, Free: 0.8 pg/mL (ref 0.0–4.2)
Testosterone: 15 ng/dL (ref 4–50)

## 2024-07-29 LAB — ESTRADIOL: Estradiol: 26 pg/mL

## 2024-07-29 LAB — 17-HYDROXYPROGESTERONE: 17-OH Progesterone LCMS: <10 ng/dL

## 2024-08-04 NOTE — Telephone Encounter (Signed)
 Try to pt no answer

## 2024-09-09 ENCOUNTER — Encounter: Payer: Self-pay | Admitting: Internal Medicine

## 2024-09-09 ENCOUNTER — Ambulatory Visit: Admitting: Internal Medicine

## 2024-09-09 VITALS — BP 120/70 | HR 74 | Temp 97.8°F | Resp 16 | Ht 69.0 in | Wt 246.0 lb

## 2024-09-09 DIAGNOSIS — Z6836 Body mass index (BMI) 36.0-36.9, adult: Secondary | ICD-10-CM

## 2024-09-09 DIAGNOSIS — E66812 Obesity, class 2: Secondary | ICD-10-CM | POA: Diagnosis not present

## 2024-09-09 DIAGNOSIS — G4733 Obstructive sleep apnea (adult) (pediatric): Secondary | ICD-10-CM

## 2024-09-09 NOTE — Progress Notes (Signed)
 [facility] [address] [place], [place] [address]  Internal MEDICINE  Office Visit Note  [name]  [id number]  [id number]  Date of Service: 09/09/2024  Chief Complaint  Patient presents with   Follow-up    Weight loss     HPI Pt is seen for management of obesity Dose of Ozempic  was increased by her PCP to 12.5 mg q week  She is consuming high protein diet between 1400-1600 kcal diet  She is active  Continues to be on CPAP  Lost 5 lbs since last visit     Current Medication: Outpatient Encounter Medications as of 09/09/2024  Medication Sig   indomethacin  (INDOCIN ) 50 MG capsule Take 1 capsule (50 mg total) by mouth 2 (two) times daily as needed. Take with a meal.   magnesium oxide (MAG-OX) 400 MG tablet Take 400 mg by mouth daily.   tirzepatide  (ZEPBOUND ) 12.5 MG/0.5ML Pen Inject 12.5 mg into the skin once a week.   [DISCONTINUED] Blood Glucose Monitoring Suppl DEVI May substitute to any manufacturer covered by patient's insurance.check as needed for periods of hypoglycemia   [DISCONTINUED] Glucose Blood (BLOOD GLUCOSE TEST STRIPS) STRP Check once a day as needed for low blood sugar   [DISCONTINUED] Lancet Device MISC Check as needed once a day for hypoglycemia May substitute to any manufacturer covered by patient's insurance.   [DISCONTINUED] Lancets Misc. MISC May substitute to any manufacturer covered by patient's insurance. (Patient not taking: Reported on 09/09/2024)   No facility-administered encounter medications on file as of 09/09/2024.    Surgical History: Past Surgical History:  Procedure Laterality Date   APPENDECTOMY     COLPOSCOPY N/A 04/12/2022   Procedure: COLPOSCOPY;  Surgeon: Leonce Garnette BIRCH, MD;  Location: [facility];  Service: Gynecology;  Laterality: N/A;   DILATATION & CURETTAGE/HYSTEROSCOPY WITH MYOSURE N/A 04/12/2022   Procedure: DILATATION & CURETTAGE/HYSTEROSCOPY WITH MYOSURE, POLYPECTOMY X2;  Surgeon:  Leonce Garnette BIRCH, MD;  Location: [facility];  Service: Gynecology;  Laterality: N/A;   LAPAROSCOPIC APPENDECTOMY N/A 10/02/2019   Procedure: APPENDECTOMY LAPAROSCOPIC;  Surgeon: Desiderio Schanz, MD;  Location: [facility];  Service: General;  Laterality: N/A;    Medical History: Past Medical History:  Diagnosis Date   Allergy Teen years   Mild   Anemia    Slight   Complication of anesthesia    pt gasping for air after surgery   Concussion 2008   Fibromyalgia    Headache    migraine   Hx of migraines    Hypertension Previous visit   2 episodes of hbp   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HCC)    Viral meningitis     Family History: Family History  Problem Relation Age of Onset   Hyperlipidemia Mother    Arthritis Mother    Hypertension Mother    Heart Problems Father    Alcohol abuse Father    Healthy Sister    Autism spectrum disorder Son    Autism spectrum disorder Son    Autism spectrum disorder Son    Arthritis Maternal Grandmother    ADD / ADHD Sister    ADD / ADHD Son    Breast cancer Neg Hx     Social History   Socioeconomic History   Marital status: Married    Spouse name: [name]   Number of children: Not on file   Years of education: Not on file   Highest education level: Bachelor's degree (e.g., BA, AB, BS)  Occupational History  Occupation: [employer] in [employer]  Tobacco Use   Smoking status: Former    Current packs/day: 0.00    Average packs/day: 0.3 packs/day for 3.0 years (0.8 ttl pk-yrs)    Types: Cigarettes    Start date: 08/25/2010    Quit date: 08/25/2013    Years since quitting: 11.0   Smokeless tobacco: Never   Tobacco comments:    no smoking in 5 days  Vaping Use   Vaping status: Never Used  Substance and Sexual Activity   Alcohol use: Yes    Alcohol/week: 1.0 standard drink of alcohol    Types: 1 Cans of beer per week    Comment: occ   Drug use: No   Sexual activity: Yes  Other Topics Concern   Not on file  Social History Narrative    Not on file   Social Drivers of Health   Financial Resource Strain: Low Risk  (10/23/2023)   Overall Financial Resource Strain (CARDIA)    Difficulty of Paying Living Expenses: Not hard at all  Food Insecurity: No Food Insecurity (10/23/2023)   Hunger Vital Sign    Worried About Running Out of Food in the Last Year: Never true    Ran Out of Food in the Last Year: Never true  Transportation Needs: No Transportation Needs (10/23/2023)   PRAPARE - Administrator, Civil Service (Medical): No    Lack of Transportation (Non-Medical): No  Physical Activity: Insufficiently Active (10/23/2023)   Exercise Vital Sign    Days of Exercise per Week: 2 days    Minutes of Exercise per Session: 30 min  Stress: No Stress Concern Present (10/23/2023)   [employer] of Occupational Health - Occupational Stress Questionnaire    Feeling of Stress : Not at all  Social Connections: Moderately Integrated (10/23/2023)   Social Connection and Isolation Panel    Frequency of Communication with Friends and Family: Once a week    Frequency of Social Gatherings with Friends and Family: Once a week    Attends Religious Services: 1 to 4 times per year    Active Member of [employer] or Organizations: Yes    Attends Banker Meetings: 1 to 4 times per year    Marital Status: Married  Catering manager Violence: Not At Risk (01/05/2022)   Humiliation, Afraid, Rape, and Kick questionnaire    Fear of Current or Ex-Partner: No    Emotionally Abused: No    Physically Abused: No    Sexually Abused: No      Review of Systems  Constitutional:  Negative for fatigue and fever.  HENT:  Negative for congestion, mouth sores and postnasal drip.   Respiratory:  Negative for cough.   Cardiovascular:  Negative for chest pain.  Genitourinary:  Negative for flank pain.  Psychiatric/Behavioral: Negative.      Vital Signs: BP 120/70   Pulse 74   Temp 97.8 F (36.6 C)   Resp 16   Ht 5' 9 (1.753 m)    Wt 246 lb (111.6 kg)   LMP 08/25/2015   SpO2 99%   BMI 36.33 kg/m    Physical Exam Constitutional:      Appearance: Normal appearance.  HENT:     Head: Normocephalic and atraumatic.     Nose: Nose normal.     Mouth/Throat:     Mouth: Mucous membranes are moist.     Pharynx: No posterior oropharyngeal erythema.  Eyes:     Extraocular Movements: Extraocular movements  intact.     Pupils: Pupils are equal, round, and reactive to light.  Cardiovascular:     Pulses: Normal pulses.     Heart sounds: Normal heart sounds.  Pulmonary:     Effort: Pulmonary effort is normal.     Breath sounds: Normal breath sounds.  Neurological:     General: No focal deficit present.     Mental Status: She is alert.  Psychiatric:        Mood and Affect: Mood normal.        Behavior: Behavior normal.        Assessment/Plan: 1. OSA on CPAP (Primary) Pt is referred to [facility] for downloads  2. Class 2 obesity without serious comorbidity with body mass index (BMI) of 36.0 to 36.9 in adult, unspecified obesity type Continue Current therapy   General Counseling: Ceaira verbalizes understanding of the findings of todays visit and agrees with plan of treatment. I have discussed any further diagnostic evaluation that may be needed or ordered today. We also reviewed her medications today. she has been encouraged to call the office with any questions or concerns that should arise related to todays visit.        Total time spent:30 Minutes Time spent includes review of chart, medications, test results, and follow up plan with the patient.   [place] Controlled Substance Database was reviewed by me.   Dr Nakyla Bracco M [facility] Internal medicine

## 2024-10-16 ENCOUNTER — Other Ambulatory Visit: Payer: Self-pay | Admitting: Nurse Practitioner

## 2024-10-21 NOTE — Telephone Encounter (Signed)
 Requested medications are due for refill today.  yes  Requested medications are on the active medications list.  yes  Last refill. 08/16/2023 #60 12 rf  Future visit scheduled.   yes  Notes to clinic.  Labs are expired.    Requested Prescriptions  Pending Prescriptions Disp Refills   indomethacin  (INDOCIN ) 50 MG capsule [Pharmacy Med Name: INDOMETHACIN  50MG  CAPSULES] 60 capsule 12    Sig: TAKE 1 CAPSULE(50 MG) BY MOUTH TWICE DAILY WITH A MEAL AS NEEDED     Analgesics:  NSAIDS Failed - 10/21/2024 11:28 AM      Failed - Manual Review: Labs are only required if the patient has taken medication for more than 8 weeks.      Failed - Cr in normal range and within 360 days    Creatinine  Date Value Ref Range Status  12/18/2013 1.10 0.60 - 1.30 mg/dL Final   Creatinine, Ser  Date Value Ref Range Status  04/19/2023 0.80 0.57 - 1.00 mg/dL Final         Failed - HGB in normal range and within 360 days    Hemoglobin  Date Value Ref Range Status  04/19/2023 13.6 11.1 - 15.9 g/dL Final         Failed - PLT in normal range and within 360 days    Platelets  Date Value Ref Range Status  04/19/2023 301 150 - 450 x10E3/uL Final         Failed - HCT in normal range and within 360 days    Hematocrit  Date Value Ref Range Status  04/19/2023 41.9 34.0 - 46.6 % Final         Failed - eGFR is 30 or above and within 360 days    EGFR (African American)  Date Value Ref Range Status  12/18/2013 >60  Final   GFR calc Af Amer  Date Value Ref Range Status  10/02/2019 >60 >60 mL/min Final   EGFR (Non-African Amer.)  Date Value Ref Range Status  12/18/2013 >60  Final    Comment:    eGFR values <51mL/min/1.73 m2 may be an indication of chronic kidney disease (CKD). Calculated eGFR is useful in patients with stable renal function. The eGFR calculation will not be reliable in acutely ill patients when serum creatinine is changing rapidly. It is not useful in  patients on dialysis. The eGFR  calculation may not be applicable to patients at the low and high extremes of body sizes, pregnant women, and vegetarians.    GFR calc non Af Amer  Date Value Ref Range Status  10/02/2019 >60 >60 mL/min Final   GFR  Date Value Ref Range Status  05/28/2019 64.18 >60.00 mL/min Final   eGFR  Date Value Ref Range Status  04/19/2023 87 >59 mL/min/1.73 Final         Passed - Patient is not pregnant      Passed - Valid encounter within last 12 months    Recent Outpatient Visits           2 months ago Class 2 obesity due to excess calories without serious comorbidity with body mass index (BMI) of 37.0 to 37.9 in adult   [facility] [place], Lu Verne T, NP   9 months ago Class 2 obesity due to excess calories without serious comorbidity with body mass index (BMI) of 38.0 to 38.9 in adult   [facility] [place], Melanie DASEN, NP

## 2024-11-22 NOTE — Patient Instructions (Addendum)
 You have an order for:  []   2D Mammogram  [x]   3D Mammogram  []   Bone Density     Please call for appointment:  [facility]  [address] [place] [address] [phone] [facility] [address] [place], [place] [address] [phone] [facility] [address] [place], [place] [address] [phone]   Make sure to wear two-piece clothing.  No lotions, powders, or deodorants the day of the appointment. Make sure to bring picture ID and insurance card.  Bring list of medications you are currently taking including any supplements.   Schedule your [place] screening mammogram through MyChart!   Log into your MyChart account.  Go to 'Visit' (or 'Appointments' if on mobile App) --> Schedule an Appointment  Under 'Select a Reason for Visit' choose the Mammogram Screening option.  Complete the pre-visit questions and select the time and place that best fits your schedule.     Be Involved in Caring For Your Health:  Taking Medications When medications are taken as directed, they can greatly improve your health. But if they are not taken as prescribed, they may not work. In some cases, not taking them correctly can be harmful. To help ensure your treatment remains effective and safe, understand your medications and how to take them. Bring your medications to each visit for review by your provider.  Your lab results, notes, and after visit summary will be available on My Chart. We strongly encourage you to use this feature. If lab results are abnormal the clinic will contact you with the appropriate steps. If the clinic does not contact you assume the results are satisfactory. You can always view your results on My Chart. If you have questions regarding your health or results, please contact the clinic during office hours. You can also ask questions on  My Chart.  We at [facility] are grateful that you chose us  to provide your care. We strive to provide evidence-based and compassionate care and are always looking for feedback. If you get a survey from the clinic please complete this so we can hear your opinions.   Healthy Eating, Adult Healthy eating may help you get and keep a healthy body weight, reduce the risk of chronic disease, and live a long and productive life. It is important to follow a healthy eating pattern. Your nutritional and calorie needs should be met mainly by different nutrient-rich foods. What are tips for following this plan? Reading food labels Read labels and choose the following: Reduced or low sodium products. Juices with 100% fruit juice. Foods with low saturated fats (<3 g per serving) and high polyunsaturated and monounsaturated fats. Foods with whole grains, such as whole wheat, cracked wheat, brown rice, and wild rice. Whole grains that are fortified with folic acid . This is recommended for females who are pregnant or who want to become pregnant. Read labels and do not eat or drink the following: Foods or drinks with added sugars. These include foods that contain brown sugar, corn sweetener, corn syrup, dextrose , fructose, glucose, high-fructose corn syrup, honey, invert sugar, lactose, malt syrup, maltose, molasses, raw sugar, sucrose, trehalose, or turbinado sugar. Limit your intake of added sugars to less than 10% of your total daily calories. Do not eat more than the following amounts of added sugar per day: 6 teaspoons (25 g) for females. 9 teaspoons (38 g)  for males. Foods that contain processed or refined starches and grains. Refined grain products, such as white flour, degermed cornmeal, white bread, and white rice. Shopping Choose nutrient-rich snacks, such as vegetables, whole fruits, and nuts. Avoid high-calorie and high-sugar snacks, such as potato chips, fruit snacks, and candy. Use  oil-based dressings and spreads on foods instead of solid fats such as butter, margarine, sour cream, or cream cheese. Limit pre-made sauces, mixes, and instant products such as flavored rice, instant noodles, and ready-made pasta. Try more plant-protein sources, such as tofu, tempeh, black beans, edamame, lentils, nuts, and seeds. Explore eating plans such as the Mediterranean diet or vegetarian diet. Try heart-healthy dips made with beans and healthy fats like hummus and guacamole. Vegetables go great with these. Cooking Use oil to saut or stir-fry foods instead of solid fats such as butter, margarine, or lard. Try baking, boiling, grilling, or broiling instead of frying. Remove the fatty part of meats before cooking. Steam vegetables in water or broth. Meal planning  At meals, imagine dividing your plate into fourths: One-half of your plate is fruits and vegetables. One-fourth of your plate is whole grains. One-fourth of your plate is protein, especially lean meats, poultry, eggs, tofu, beans, or nuts. Include low-fat dairy as part of your daily diet. Lifestyle Choose healthy options in all settings, including home, work, school, restaurants, or stores. Prepare your food safely: Wash your hands after handling raw meats. Where you prepare food, keep surfaces clean by regularly washing with hot, soapy water. Keep raw meats separate from ready-to-eat foods, such as fruits and vegetables. Cook seafood, meat, poultry, and eggs to the recommended temperature. Get a food thermometer. Store foods at safe temperatures. In general: Keep cold foods at 65F (4.4C) or below. Keep hot foods at 165F (60C) or above. Keep your freezer at [facility] (-17.8C) or below. Foods are not safe to eat if they have been between the temperatures of 40-165F (4.4-60C) for more than 2 hours. What foods should I eat? Fruits Aim to eat 1-2 cups of fresh, canned (in natural juice), or frozen fruits each day. One  cup of fruit equals 1 small apple, 1 large banana, 8 large strawberries, 1 cup (237 g) canned fruit,  cup (82 g) dried fruit, or 1 cup (240 mL) 100% juice. Vegetables Aim to eat 2-4 cups of fresh and frozen vegetables each day, including different varieties and colors. One cup of vegetables equals 1 cup (91 g) broccoli or cauliflower florets, 2 medium carrots, 2 cups (150 g) raw, leafy greens, 1 large tomato, 1 large bell pepper, 1 large sweet potato, or 1 medium white potato. Grains Aim to eat 5-10 ounce-equivalents of whole grains each day. Examples of 1 ounce-equivalent of grains include 1 slice of bread, 1 cup (40 g) ready-to-eat cereal, 3 cups (24 g) popcorn, or  cup (93 g) cooked rice. Meats and other proteins Try to eat 5-7 ounce-equivalents of protein each day. Examples of 1 ounce-equivalent of protein include 1 egg,  oz nuts (12 almonds, 24 pistachios, or 7 walnut halves), 1/4 cup (90 g) cooked beans, 6 tablespoons (90 g) hummus or 1 tablespoon (16 g) peanut butter. A cut of meat or fish that is the size of a deck of cards is about 3-4 ounce-equivalents (85 g). Of the protein you eat each week, try to have at least 8 sounce (227 g) of seafood. This is about 2 servings per week. This includes salmon, trout, herring, sardines, and anchovies. Dairy Aim to eat  3 cup-equivalents of fat-free or low-fat dairy each day. Examples of 1 cup-equivalent of dairy include 1 cup (240 mL) milk, 8 ounces (250 g) yogurt, 1 ounces (44 g) natural cheese, or 1 cup (240 mL) fortified soy milk. Fats and oils Aim for about 5 teaspoons (21 g) of fats and oils per day. Choose monounsaturated fats, such as canola and olive oils, mayonnaise made with olive oil or avocado oil, avocados, peanut butter, and most nuts, or polyunsaturated fats, such as sunflower, corn, and soybean oils, walnuts, pine nuts, sesame seeds, sunflower seeds, and flaxseed. Beverages Aim for 6 eight-ounce glasses of water per day. Limit coffee to  3-5 eight-ounce cups per day. Limit caffeinated beverages that have added calories, such as soda and energy drinks. If you drink alcohol: Limit how much you have to: 0-1 drink a day if you are female. 0-2 drinks a day if you are female. Know how much alcohol is in your drink. In the [place] one drink is one 12 oz bottle of beer (355 mL), one 5 oz glass of wine (148 mL), or one 1 oz glass of hard liquor (44 mL). Seasoning and other foods Try not to add too much salt to your food. Try using herbs and spices instead of salt. Try not to add sugar to food. This information is based on [place] nutrition guidelines. To learn more, visit disposablenylon.be. Exact amounts may vary. You may need different amounts. This information is not intended to replace advice given to you by your health care provider. Make sure you discuss any questions you have with your health care provider. Document Revised: 08/07/2022 Document Reviewed: 08/07/2022 Elsevier Patient Education  2024 [employer].

## 2024-11-27 ENCOUNTER — Encounter: Payer: Self-pay | Admitting: Nurse Practitioner

## 2024-11-27 ENCOUNTER — Ambulatory Visit: Admitting: Nurse Practitioner

## 2024-11-27 VITALS — BP 109/77 | HR 64 | Temp 98.1°F | Resp 17 | Ht 69.02 in | Wt 244.8 lb

## 2024-11-27 DIAGNOSIS — E66812 Obesity, class 2: Secondary | ICD-10-CM

## 2024-11-27 DIAGNOSIS — E6609 Other obesity due to excess calories: Secondary | ICD-10-CM

## 2024-11-27 DIAGNOSIS — Z6837 Body mass index (BMI) 37.0-37.9, adult: Secondary | ICD-10-CM | POA: Diagnosis not present

## 2024-11-27 DIAGNOSIS — J069 Acute upper respiratory infection, unspecified: Secondary | ICD-10-CM | POA: Diagnosis not present

## 2024-11-27 DIAGNOSIS — Z1231 Encounter for screening mammogram for malignant neoplasm of breast: Secondary | ICD-10-CM

## 2024-11-27 LAB — POC COVID19 BINAXNOW: SARS Coronavirus 2 Ag: NEGATIVE

## 2024-11-27 NOTE — Progress Notes (Signed)
 "  BP 109/77 (BP Location: Left Arm, Patient Position: Sitting, Cuff Size: Large)   Pulse 64   Temp 98.1 F (36.7 C) (Oral)   Resp 17   Ht 5' 9.02 (1.753 m)   Wt 244 lb 12.8 oz (111 kg)   LMP 08/25/2015   SpO2 94%   BMI 36.13 kg/m    Subjective:    Patient ID: [name], female    DOB: 25-May-1966, 59 y.o.   MRN: [id number]  HPI: [name] is a 59 y.o. female  Chief Complaint  Patient presents with   Weight Check    Continuing to make progress   Sore Throat    Sore throat the past two days, has runny nose and congestion, headache    WEIGHT GAIN Taking Zepbound  12.5 MG weekly -- has to switch over to her insurance program to obtain this. Continues to follow with Dr. Fernand for weight management.  Has lost 2 lbs since last visit. Prior to this was on Ozempic , but had stopped losing with this and switched over.  Has no side effects with medication. Saw dietician for a period. Duration: chronic Previous attempts at weight loss: yes Complications of obesity: HLD, Obesity Peak weight: 290 lbs Weight loss goal:  180 to 190 lbs range Weight loss to date: 2 lbs since last visit Requesting obesity pharmacotherapy: yes Current weight loss supplements/medications: yes Previous weight loss supplements/meds: yes Calories:  1800 calories  UPPER RESPIRATORY TRACT INFECTION Started two days ago with sore throat. Fever: no - felt hot last night Cough: no Shortness of breath: no Wheezing: no Chest pain: no Chest tightness: no Chest congestion: no Nasal congestion: yes Runny nose: yes Post nasal drip: yes Sneezing: no Sore throat: yes Swollen glands: no Sinus pressure: no Headache: yes this morning Face pain: no Toothache: yes Ear pain: none Ear pressure: yes bilateral Eyes red/itching:yes Eye drainage/crusting: no  Vomiting: no Rash: no Fatigue: a little bit Sick contacts: two weeks ago Strep contacts: no  Context: stable Recurrent sinusitis: no Relief with  OTC cold/cough medications: yes  Treatments attempted: Ricola, Airborne    Relevant past medical, surgical, family and social history reviewed and updated as indicated. Interim medical history since our last visit reviewed. Allergies and medications reviewed and updated.  Review of Systems  Constitutional:  Negative for activity change, appetite change, diaphoresis, fatigue and fever.  Respiratory:  Negative for cough, chest tightness, shortness of breath and wheezing.   Cardiovascular:  Negative for chest pain, palpitations and leg swelling.  Gastrointestinal: Negative.   Endocrine: Negative.   Neurological: Negative.   Psychiatric/Behavioral: Negative.     Per HPI unless specifically indicated above     Objective:    BP 109/77 (BP Location: Left Arm, Patient Position: Sitting, Cuff Size: Large)   Pulse 64   Temp 98.1 F (36.7 C) (Oral)   Resp 17   Ht 5' 9.02 (1.753 m)   Wt 244 lb 12.8 oz (111 kg)   LMP 08/25/2015   SpO2 94%   BMI 36.13 kg/m   Wt Readings from Last 3 Encounters:  11/27/24 244 lb 12.8 oz (111 kg)  09/09/24 246 lb (111.6 kg)  07/28/24 251 lb (113.9 kg)    Physical Exam Vitals and nursing note reviewed.  Constitutional:      General: She is awake. She is not in acute distress.    Appearance: She is well-developed and well-groomed. She is obese. She is not ill-appearing or toxic-appearing.  HENT:  Head: Normocephalic.     Right Ear: Hearing and external ear normal.     Left Ear: Hearing and external ear normal.  Eyes:     General: Lids are normal.        Right eye: No discharge.        Left eye: No discharge.     Conjunctiva/sclera: Conjunctivae normal.     Pupils: Pupils are equal, round, and reactive to light.  Neck:     Thyroid : No thyromegaly.     Vascular: No carotid bruit.  Cardiovascular:     Rate and Rhythm: Normal rate and regular rhythm.     Heart sounds: Normal heart sounds. No murmur heard.    No gallop.  Pulmonary:     Effort:  Pulmonary effort is normal. No accessory muscle usage or respiratory distress.     Breath sounds: Normal breath sounds.  Abdominal:     General: Bowel sounds are normal. There is no distension.     Palpations: Abdomen is soft.     Tenderness: There is no abdominal tenderness.  Musculoskeletal:     Cervical back: Normal range of motion and neck supple.     Right lower leg: No edema.     Left lower leg: No edema.  Lymphadenopathy:     Cervical: No cervical adenopathy.  Skin:    General: Skin is warm and dry.  Neurological:     Mental Status: She is alert and oriented to person, place, and time.     Deep Tendon Reflexes: Reflexes are normal and symmetric.     Reflex Scores:      Brachioradialis reflexes are 2+ on the right side and 2+ on the left side.      Patellar reflexes are 2+ on the right side and 2+ on the left side. Psychiatric:        Attention and Perception: Attention normal.        Mood and Affect: Mood normal.        Speech: Speech normal.        Behavior: Behavior normal. Behavior is cooperative.        Thought Content: Thought content normal.    Results for orders placed or performed in visit on 07/15/24  Estradiol    Collection Time: 07/22/24  3:44 PM  Result Value Ref Range   Estradiol  26.0 pg/mL  17-Hydroxyprogesterone   Collection Time: 07/22/24  3:44 PM  Result Value Ref Range   17-OH Progesterone  LCMS <10 ng/dL  Testosterone ,Free and Total   Collection Time: 07/22/24  3:44 PM  Result Value Ref Range   Testosterone  15 4 - 50 ng/dL   Testosterone , Free 0.8 0.0 - 4.2 pg/mL      Assessment & Plan:   Problem List Items Addressed This Visit       Respiratory   URI (upper respiratory infection)   Acute for 2 days. Covid, Flu, and Strep negative. At this time recommend: - Increased rest - Increasing Fluids - Acetaminophen  / ibuprofen  as needed for fever/pain.  - Salt water gargling, chloraseptic spray and throat lozenges - Mucinex .  - Saline sinus  flushes or a neti pot.  - Humidifying the air.       Relevant Orders   POC COVID-19   Influenza A & B (STAT)   Rapid Strep Screen ([facility] ONLY)     Other   Obesity - Primary   BMI 36.13, 2 lbs lost since last visit.  Has lost inches. Will  continue Zepbound  12.5 MG, continue to follow with weight management provider as needed -- she will have to start getting medication via program through her insurance.  She is tolerating well without ADR.  Recommended eating smaller high protein, low fat meals more frequently and exercising 30 mins a day 5 times a week with a goal of 10-15lb weight loss in the next 3 months. Patient voiced their understanding and motivation to adhere to these recommendations.         Other Visit Diagnoses       Encounter for screening mammogram for malignant neoplasm of breast       Mammogram ordered.   Relevant Orders   MM 3D SCREENING MAMMOGRAM BILATERAL BREAST       Follow up plan: Return in about 5 months (around 04/21/2025) for Annual Physical after 04/18/34.      "

## 2024-11-27 NOTE — Assessment & Plan Note (Signed)
 Acute for 2 days. Covid, Flu, and Strep negative. At this time recommend: - Increased rest - Increasing Fluids - Acetaminophen  / ibuprofen  as needed for fever/pain.  - Salt water gargling, chloraseptic spray and throat lozenges - Mucinex .  - Saline sinus flushes or a neti pot.  - Humidifying the air.

## 2024-11-27 NOTE — Assessment & Plan Note (Signed)
 BMI 36.13, 2 lbs lost since last visit.  Has lost inches. Will continue Zepbound  12.5 MG, continue to follow with weight management provider as needed -- she will have to start getting medication via program through her insurance.  She is tolerating well without ADR.  Recommended eating smaller high protein, low fat meals more frequently and exercising 30 mins a day 5 times a week with a goal of 10-15lb weight loss in the next 3 months. Patient voiced their understanding and motivation to adhere to these recommendations.

## 2024-11-30 LAB — RAPID STREP SCREEN (MED CTR MEBANE ONLY): Strep Gp A Ag, IA W/Reflex: NEGATIVE

## 2024-11-30 LAB — VERITOR FLU A/B WAIVED
Influenza A: NEGATIVE
Influenza B: NEGATIVE

## 2024-11-30 LAB — CULTURE, GROUP A STREP

## 2024-12-25 ENCOUNTER — Ambulatory Visit
Admission: RE | Admit: 2024-12-25 | Discharge: 2024-12-25 | Disposition: A | Source: Ambulatory Visit | Attending: Nurse Practitioner | Admitting: Nurse Practitioner

## 2024-12-25 DIAGNOSIS — Z1231 Encounter for screening mammogram for malignant neoplasm of breast: Secondary | ICD-10-CM

## 2025-03-10 ENCOUNTER — Ambulatory Visit: Admitting: Internal Medicine

## 2025-04-23 ENCOUNTER — Encounter: Admitting: Nurse Practitioner

## 2025-04-30 ENCOUNTER — Encounter: Admitting: Nurse Practitioner
